# Patient Record
Sex: Female | Born: 1970
Health system: Southern US, Community
[De-identification: ages and names within clinical notes are randomized; demographics above are authoritative.]

## PROBLEM LIST (undated history)

## (undated) DIAGNOSIS — R32 Unspecified urinary incontinence: Secondary | ICD-10-CM

## (undated) DIAGNOSIS — F32A Depression, unspecified: Secondary | ICD-10-CM

## (undated) DIAGNOSIS — D071 Carcinoma in situ of vulva: Secondary | ICD-10-CM

## (undated) DIAGNOSIS — Z8544 Personal history of malignant neoplasm of other female genital organs: Secondary | ICD-10-CM

## (undated) DIAGNOSIS — R87629 Unspecified abnormal cytological findings in specimens from vagina: Secondary | ICD-10-CM

## (undated) DIAGNOSIS — Z923 Personal history of irradiation: Secondary | ICD-10-CM

## (undated) DIAGNOSIS — F200 Paranoid schizophrenia: Secondary | ICD-10-CM

## (undated) DIAGNOSIS — F329 Major depressive disorder, single episode, unspecified: Secondary | ICD-10-CM

## (undated) DIAGNOSIS — Z972 Presence of dental prosthetic device (complete) (partial): Secondary | ICD-10-CM

## (undated) DIAGNOSIS — Z8709 Personal history of other diseases of the respiratory system: Secondary | ICD-10-CM

## (undated) HISTORY — DX: Unspecified abnormal cytological findings in specimens from vagina: R87.629

## (undated) HISTORY — DX: Depression, unspecified: F32.A

## (undated) HISTORY — DX: Major depressive disorder, single episode, unspecified: F32.9

## (undated) HISTORY — PX: TUBAL LIGATION: SHX77

---

## 2001-03-13 ENCOUNTER — Other Ambulatory Visit: Admission: RE | Admit: 2001-03-13 | Discharge: 2001-03-13 | Payer: Self-pay | Admitting: Obstetrics and Gynecology

## 2001-05-14 ENCOUNTER — Observation Stay (HOSPITAL_COMMUNITY): Admission: RE | Admit: 2001-05-14 | Discharge: 2001-05-15 | Payer: Self-pay | Admitting: Obstetrics and Gynecology

## 2001-05-14 HISTORY — PX: OTHER SURGICAL HISTORY: SHX169

## 2002-07-15 ENCOUNTER — Inpatient Hospital Stay (HOSPITAL_COMMUNITY): Admission: RE | Admit: 2002-07-15 | Discharge: 2002-07-16 | Payer: Self-pay | Admitting: Obstetrics and Gynecology

## 2002-07-15 HISTORY — PX: VULVECTOMY PARTIAL: SHX6187

## 2003-06-20 ENCOUNTER — Other Ambulatory Visit: Admission: RE | Admit: 2003-06-20 | Discharge: 2003-06-20 | Payer: Self-pay | Admitting: Obstetrics and Gynecology

## 2006-03-13 ENCOUNTER — Ambulatory Visit (HOSPITAL_COMMUNITY): Admission: RE | Admit: 2006-03-13 | Discharge: 2006-03-13 | Payer: Self-pay | Admitting: Obstetrics and Gynecology

## 2007-11-15 ENCOUNTER — Other Ambulatory Visit: Admission: RE | Admit: 2007-11-15 | Discharge: 2007-11-15 | Payer: Self-pay | Admitting: Obstetrics & Gynecology

## 2008-02-06 ENCOUNTER — Ambulatory Visit (HOSPITAL_COMMUNITY): Admission: RE | Admit: 2008-02-06 | Discharge: 2008-02-06 | Payer: Self-pay | Admitting: Obstetrics & Gynecology

## 2008-02-06 ENCOUNTER — Encounter: Payer: Self-pay | Admitting: Obstetrics & Gynecology

## 2008-02-06 HISTORY — PX: OTHER SURGICAL HISTORY: SHX169

## 2009-02-27 ENCOUNTER — Other Ambulatory Visit: Admission: RE | Admit: 2009-02-27 | Discharge: 2009-02-27 | Payer: Self-pay | Admitting: Obstetrics & Gynecology

## 2010-04-08 ENCOUNTER — Emergency Department (HOSPITAL_COMMUNITY)
Admission: EM | Admit: 2010-04-08 | Discharge: 2010-04-08 | Payer: Self-pay | Source: Home / Self Care | Admitting: Emergency Medicine

## 2010-06-04 ENCOUNTER — Other Ambulatory Visit (HOSPITAL_BASED_OUTPATIENT_CLINIC_OR_DEPARTMENT_OTHER): Payer: Self-pay | Admitting: *Deleted

## 2010-06-04 DIAGNOSIS — Z139 Encounter for screening, unspecified: Secondary | ICD-10-CM

## 2010-06-10 ENCOUNTER — Ambulatory Visit (HOSPITAL_COMMUNITY): Payer: Medicaid Other

## 2010-06-11 ENCOUNTER — Other Ambulatory Visit (HOSPITAL_COMMUNITY): Payer: Self-pay | Admitting: "Endocrinology

## 2010-06-11 ENCOUNTER — Other Ambulatory Visit (HOSPITAL_COMMUNITY): Payer: Self-pay | Admitting: Internal Medicine

## 2010-06-11 DIAGNOSIS — Z139 Encounter for screening, unspecified: Secondary | ICD-10-CM

## 2010-06-17 ENCOUNTER — Ambulatory Visit (HOSPITAL_COMMUNITY)
Admission: RE | Admit: 2010-06-17 | Discharge: 2010-06-17 | Disposition: A | Discharge status: Home / Self Care | Payer: Medicaid Other | Source: Ambulatory Visit | Attending: Obstetrics & Gynecology | Admitting: Obstetrics & Gynecology

## 2010-06-17 DIAGNOSIS — Z139 Encounter for screening, unspecified: Secondary | ICD-10-CM

## 2010-06-17 DIAGNOSIS — Z1231 Encounter for screening mammogram for malignant neoplasm of breast: Secondary | ICD-10-CM | POA: Insufficient documentation

## 2010-09-07 NOTE — Op Note (Signed)
NAME:  Briana Wiley, Briana Wiley             ACCOUNT NO.:  [id number]   MEDICAL RECORD NO.:  [id number]          PATIENT TYPE:  AMB   LOCATION:  DAY                           FACILITY:  [facility]   PHYSICIAN:  Lazaro Arms, M.D.   DATE OF BIRTH:  [birth year]   DATE OF PROCEDURE:  02/06/2008  DATE OF DISCHARGE:                               OPERATIVE REPORT   PREOPERATIVE DIAGNOSES:  1. Menometrorrhagia.  2. Dysmenorrhea.  3. Unresponsive to Megace therapy.   POSTOPERATIVE DIAGNOSES:  1. Menometrorrhagia.  2. Dysmenorrhea.  3. Unresponsive to Megace therapy.   PROCEDURES:  1. Hysteroscopy.  2. Dilation and curettage.  3. Endometrial ablation.   SURGEON:  Lazaro Arms, MD   ANESTHESIA:  General endotracheal.   FINDINGS:  The patient had normal endometrium.  No polyps.  No fibroids.  No abnormalities.   DESCRIPTION OF OPERATION:  The patient was taken to the operating room  and placed in the supine position where she underwent general  endotracheal anesthesia, placed in the lithotomy position, and prepped  and draped in the usual sterile fashion.  The cervix was dilated  serially to allow passage of the hysteroscope.  Hysteroscopy was  performed and found to be normal.  A vigorous uterine curettage was then  performed and good uterine cryo was obtained in all areas.  ThermaChoice  3 endometrial ablation balloon was used.  13 mL of D5W was required to  maintain a pressure between 190 and 200 mmHg throughout the procedure.  It was heated to 87 degrees Celsius.  Total therapy time was 8 minutes and 44 seconds.  All the fluid was  returned. In the procedure, the equipment worked well.  The patient  received Ancef and Toradol prophylactically.  It was a clean  uncontaminated case.  All counts were correct.      Lazaro Arms, M.D.  Electronically Signed     LHE/MEDQ  D:  02/06/2008  T:  02/06/2008  Job:  [id number]

## 2010-09-10 NOTE — Op Note (Signed)
[facility]  Patient:    Briana Wiley, Briana Wiley Visit Number: [id number] MRN: [id number]          Service Type: OBV Location: 3A A321 01 Attending Physician:  Tilda Burrow Dictated by:   Christin Bach, M.D. Proc. Date: 05/14/01 Admit Date:  05/14/2001 Discharge Date: 05/15/2001                             Operative Report  PREOPERATIVE DIAGNOSIS:   Cervical dysplasia, pigmented vulvar lesions, rule out vulvar neoplasia.  POSTOPERATIVE DIAGNOSIS:  Cervical dysplasia, pigmented vulvar lesions, rule out neoplasia.  PROCEDURE:  Cold knife conization, multiple vulvar biopsies.  INDICATIONS:  A 40 year old, nulliparous female with borderline mental function, residing at [facility], admitted for [facility] for evaluation of abnormal Pap smear which showed high-grade abnormalities with endocervical involvement.  Additionally the patient had some warty lesions on the left side of vaginal entrance and dark, pigmented changes involving numerous portions of the labia majora.  Past medical history was notable for schizophrenia.  DESCRIPTION OF PROCEDURE:  The patient was taken to the operating room, prepped and draped for a vaginal procedure with general anesthesia in place. The weighted speculum was inserted into the vagina and lateral retractor was used and the cervix visualized.  Lugols solution was applied to identify nonstaining areas of the cervix and then a circumferential elliptical incision performed to remove the entire exocervix and a cone-shaped specimen removed, going approximately 2 cm up the endocervical canal.  Specimen was technically challenging to remove and had to be opened as a part of the removal.  Hemostasis was adequate with Monsels solution completing the procedure.  Vulvar biopsies:  The patient then had approximately one-third of the dark, pigmented lesions on the vulva removed by elliptical incision around their edges.  We were  careful to leave a 2-3 mm area of visually normal appearing tissue as a part of an elliptical incision.  On the patients right labia minora area, there was such extensive involvement that only representative biopsy samples could be obtained.  In the areas where the subcutaneous tissue was completely removed, we placed subcutaneous 4-0 Dexon sutures to pull tissue edges into reapproximation.  The patient tolerated the procedure well and will be kept overnight for observation and pain control. Dictated by:   Christin Bach, M.D. Attending Physician:  Tilda Burrow DD:  05/28/01 TD:  05/28/01 Job: [id number]

## 2010-09-10 NOTE — H&P (Signed)
   NAME:  Briana Wiley, Briana Wiley NO.:  [id number]   MEDICAL RECORD NO.:  [id number]                  PATIENT TYPE:   LOCATION:                                       FACILITY:   PHYSICIAN:  Tilda Burrow, M.D.              DATE OF BIRTH:   DATE OF ADMISSION:  DATE OF DISCHARGE:                                HISTORY & PHYSICAL   ADMISSION DIAGNOSES:  1. Vulvar entrance epithelial neoplasia III.  2. Paranoid schizophrenia, stable.   HISTORY OF PRESENT ILLNESS:  This 40 year old female gravida 3, para 3,  status post bilateral tubal ligation and prior wide local excision vulvar  biopsy confirming VIN III of the vulvar is admitted at this time for partial  bilateral vulvectomy with plans to remove the majority of the labia minora  on both sides due to extensive involvement in the dysplastic process felt to  be due to the same VIN III noted before.  Plans are for local excision and  reapproximation of skin edges at the time.  We have talked with the  patient's caregivers as well as with the patient and explained the need for  this procedure in great detail to her.  She seems accepting and  understanding of the procedure.  The family has arranged to get the patient  to the hospital for that date.   PAST MEDICAL HISTORY:  Paranoid schizophrenia, stable.  Disability.   PAST SURGICAL HISTORY:  Negative except for wide local excision of vulvar  lesions in 2003.   MEDICATIONS:  None.   PHYSICAL EXAMINATION:  VITAL SIGNS:  Height 5 feet, 5 inches, weight 145.  Blood pressure 120/75.  GENERAL:  Shows an alert, communicative Caucasian female.  HEENT: Pupils equal, round, reactive to light.  Extraocular muscles intact.  NECK:  Supple.  Trachea midline.  CHEST:  Clear to auscultation.  ABDOMEN:  Obese without masses.  EXTERNAL GENITALIA:  Thickened hyperkeratotic vulvar tissues bilaterally  which are consistent with VIN III biopsied earlier. Perianal tissue is  normal.  Vaginal examination is negative at this time.  Prior Pap smear  showed cervical dysplasia at the time of conization in 2003.   PLAN:  Bilateral partial vulvectomy on July 15, 2002.                                               Tilda Burrow, M.D.    JVF/MEDQ  D:  07/12/2002  T:  07/12/2002  Job:  [id number]

## 2010-09-10 NOTE — Op Note (Signed)
   NAME:  Briana Wiley, Briana Wiley                       ACCOUNT NO.:  [id number]   MEDICAL RECORD NO.:  [id number]                   PATIENT TYPE:  AMB   LOCATION:  DAY                                  FACILITY:  [facility]   PHYSICIAN:  Tilda Burrow, M.D.              DATE OF BIRTH:  1970-10-05   DATE OF PROCEDURE:  DATE OF DISCHARGE:                                 OPERATIVE REPORT   PREOPERATIVE DIAGNOSIS:  Vulvar intraepithelial neoplasia III (CIN 3).   POSTOPERATIVE DIAGNOSIS:  Vulvar intraepithelial neoplasia III (CIN 3).   PROCEDURE:  Bilateral partial vulvectomy.   SURGEON:  Tilda Burrow, M.D.   ASSISTANTAmie Critchley, C.S.T.   ANESTHESIA:  General.   COMPLICATIONS:  None.   FINDINGS:  Extensive dysplastic changes of the labia minora from 2 o'clock  to 10 o'clock involving the posterior fourchette and a couple of satellite  lesions requiring excision.   DESCRIPTION OF PROCEDURE:  The patient was taken to the operating room and  prepped and draped for a vaginal procedure with legs elevated and yellow  thin supports.  The patient had demarcation of the atypical vulvar tissues  with a skin marker.  We then proceeded to infiltrate underneath all of the  affected tissues with 0.5% Marcaine with epinephrine.   We then proceeded to use a #15 blade to trim the edges of the abnormal  tissue.  We were very cautious to try to cut just a millimeter or two  lateral to the edges of the affected tissue.  This was performed all the way  around the affected skin on the patient's right side first.  Two satellite  lesions were cored out as well.  We then undermined once these were trimmed  off, removing the labia minora entirely from 10 o'clock to 6 o'clock.  We  undermined the adjacent connective tissue internally and externally  sufficient that a series of four interrupted 4-0 Dexon subcutaneous sutures  were placed to better reapproximate the tissue edges then a continuous  running 4-0  Prolene used to close the skin edges with a loose continuous  closure.  The patient tolerated this part quite well.   We then turned to the other side where a similar technique was used.  The  result was smooth, well-reapproximated skin edges bilaterally.  There was  slight oozing.  Topical Neosporin was applied and the patient allowed to go  to the recovery room in good condition.                                              Tilda Burrow, M.D.   JVF/MEDQ  D:  07/15/2002  T:  07/15/2002  Job:  [id number]

## 2010-09-10 NOTE — Discharge Summary (Signed)
   NAME:  Briana Wiley, BITTINGER                       ACCOUNT NO.:  [id number]   MEDICAL RECORD NO.:  [id number]                   PATIENT TYPE:  INP   LOCATION:  A304                                 FACILITY:  [facility]   PHYSICIAN:  Tilda Burrow, M.D.              DATE OF BIRTH:  [birth year]   DATE OF ADMISSION:  07/15/2002  DATE OF DISCHARGE:  07/16/2002                                 DISCHARGE SUMMARY   ADMISSION DIAGNOSES:  1. Vulvar intraepithelial neoplasia III (VIN III).  2. Paranoid schizophrenia, stable.   DISCHARGE DIAGNOSES:  1. Vulvar intraepithelial neoplasia III (VIN III).  2. Paranoid schizophrenia, stable.   PROCEDURE:  July 14, 2001:  Bilateral partial vulvectomy.   DISCHARGE MEDICATIONS:  1. Neosporin topical to vulva daily x2 weeks.  2. Motrin 400 mg q.4h. p.r.n. pain.  3. Haldol intramuscular injection every month through [facility].   FOLLOW-UP:  Two weeks in our office for suture removal.   HISTORY OF PRESENT ILLNESS:  This 40 year old female gravida 3 para 2 status  post tubal ligation with wide excision vulvar biopsies in the past  confirming VIN III is admitted for bilateral partial vulvectomy due to  extensive involvement of the labia minora in the dysplastic process.   PAST MEDICAL HISTORY:  Paranoid schizophrenia, stable.   SURGICAL HISTORY:  Negative except for a tubal ligation, and wide excision  of vulvar lesions in 2003.   PHYSICAL EXAMINATION:  VITAL SIGNS:  Height 5 feet 5 inches, weight 145.  Blood pressure 120/75.  GYNECOLOGICAL:  Notable for thickened hyperkeratotic vulvar tissues  displacing the labia minora from 2 o'clock to 10 o'clock including the  posterior fourchet.  A couple of isolated satellite lesions exist.   HOSPITAL COURSE:  The patient was taken to the operating room and the VIN  III involved areas excised as described in the operative note with local  anesthetic primarily used, with general anesthesia  to help the patient  remain calm.  The patient had excellent tissue edge reapproximation and had  surprisingly little pain postoperatively.  She was stable for discharge home  on postoperative day #1 for follow-up in one week, then two weeks in our  office.                                              Tilda Burrow, M.D.   JVF/MEDQ  D:  07/16/2002  T:  07/16/2002  Job:  [id number]

## 2010-09-10 NOTE — H&P (Signed)
[facility]  Patient:    Briana Wiley, Briana Wiley Visit Number: [id number] MRN: [id number]          Service Type: Attending:  Christin Bach, M.D. Dictated by:   Christin Bach, M.D. Adm. Date:  05/11/01                           History and Physical  ADMITTING DIAGNOSES: 1. Cervical dysplasia with endocervical involvement. 2. Pigmented vulvar lesions, rule out vulvar intraepithelial neoplasia.  HISTORY OF PRESENT ILLNESS:  This 40 year old female, G0, P0, referred from [facility] who is admitted at this time for cold-knife conization and vulvar biopsies.  Kitti has been seen in our office for evaluation of abnormal Pap smear.  She had a Pap smear by Dr. Felecia Shelling on February 16, 2001, suggesting high-grade squamous epithelial abnormalities with both moderate dysplasia and HPV effect noted.  She was seen in our office where upon colposcopy was performed March 13, 2001.  This showed an area of abnormal tissue at the posterior lip of the cervix at 6 oclock to 9 oclock.  This was biopsied and returned showing mild dysplasia.  Unfortunately, there was endocervical involvement on the endocervical curettage despite efforts to avoid contamination.  The endocervical assessment was performed to rule out hidden abnormalities.  The procedure has been explained to the patient with visual guides as her understanding is somewhat limited.  Additionally, the patient has some warty lesion on the left side of the vaginal entrance which may represent condyloma or may represent vulvar intraepithelial neoplasia.  Biopsies are planned to rule out premalignant changes.  PAST MEDICAL HISTORY:  Schizophrenia.  PAST SURGICAL HISTORY:  Negative.  ALLERGIES:  No known drug allergies.  MEDICATIONS:  None.  PHYSICAL EXAMINATION:  VITAL SIGNS:  Height 5 feet 5 inches, weight 161.  Blood pressure 110/60.  GENERAL:  This is a somber, cheerful female who appears to have  appropriate, but limited comprehension of issues involved.  The procedure has been explained to her satisfaction and with visual guides to make it easier for her.  CHEST:  Clear to auscultation.  ABDOMEN:  Nontender.  GENITALIA:  External genitalia with multiple vulvar lesions around the introitus as well as three hyperplastic lesions in the left gluteal crease.  IMPRESSION: 1. Cervical intraepithelial neoplasia-1 with endocervical involvement, rule    out high-grade dysplasia or endocervical involvement. 2. Pigmented vulvar lesions, rule out vulvar premalignant changes.  PLAN:  Cold-knife conization and vulvar biopsies on May 14, 2001. Dictated by:   Christin Bach, M.D. Attending:  Christin Bach, M.D. DD:  05/11/01 TD:  05/11/01 Job: [id number]

## 2011-01-24 LAB — COMPREHENSIVE METABOLIC PANEL
AST: 15
Albumin: 4.3
BUN: 7
Calcium: 9.7
Creatinine, Ser: 0.88
GFR calc Af Amer: >60
Total Protein: 6.5

## 2011-01-24 LAB — URINE MICROSCOPIC-ADD ON

## 2011-01-24 LAB — CBC
HCT: 39.5
Hemoglobin: 13.9
MCHC: 35.1
MCV: 90.1
Platelets: 176
RBC: 4.39
RDW: 14.4
WBC: 10.2

## 2011-01-24 LAB — URINALYSIS, ROUTINE W REFLEX MICROSCOPIC
Glucose, UA: NEGATIVE
Leukocytes, UA: NEGATIVE
Specific Gravity, Urine: 1.02
pH: 6

## 2011-05-09 ENCOUNTER — Other Ambulatory Visit (HOSPITAL_COMMUNITY): Payer: Self-pay | Admitting: Internal Medicine

## 2011-05-09 DIAGNOSIS — Z139 Encounter for screening, unspecified: Secondary | ICD-10-CM

## 2011-06-08 DIAGNOSIS — F200 Paranoid schizophrenia: Secondary | ICD-10-CM | POA: Diagnosis not present

## 2011-06-20 ENCOUNTER — Ambulatory Visit (HOSPITAL_COMMUNITY)
Admission: RE | Admit: 2011-06-20 | Discharge: 2011-06-20 | Disposition: A | Discharge status: Home / Self Care | Payer: Medicare Other | Source: Ambulatory Visit | Attending: Internal Medicine | Admitting: Internal Medicine

## 2011-06-20 DIAGNOSIS — Z1231 Encounter for screening mammogram for malignant neoplasm of breast: Secondary | ICD-10-CM | POA: Diagnosis not present

## 2011-06-20 DIAGNOSIS — Z139 Encounter for screening, unspecified: Secondary | ICD-10-CM

## 2011-06-21 DIAGNOSIS — L089 Local infection of the skin and subcutaneous tissue, unspecified: Secondary | ICD-10-CM | POA: Diagnosis not present

## 2011-08-22 DIAGNOSIS — G608 Other hereditary and idiopathic neuropathies: Secondary | ICD-10-CM | POA: Diagnosis not present

## 2011-08-22 DIAGNOSIS — R7309 Other abnormal glucose: Secondary | ICD-10-CM | POA: Diagnosis not present

## 2011-08-22 DIAGNOSIS — E039 Hypothyroidism, unspecified: Secondary | ICD-10-CM | POA: Diagnosis not present

## 2011-08-22 DIAGNOSIS — R799 Abnormal finding of blood chemistry, unspecified: Secondary | ICD-10-CM | POA: Diagnosis not present

## 2011-10-13 DIAGNOSIS — F200 Paranoid schizophrenia: Secondary | ICD-10-CM | POA: Diagnosis not present

## 2011-11-21 DIAGNOSIS — F17200 Nicotine dependence, unspecified, uncomplicated: Secondary | ICD-10-CM | POA: Diagnosis not present

## 2011-11-21 DIAGNOSIS — R51 Headache: Secondary | ICD-10-CM | POA: Diagnosis not present

## 2011-12-30 DIAGNOSIS — N63 Unspecified lump in unspecified breast: Secondary | ICD-10-CM | POA: Diagnosis not present

## 2012-01-03 ENCOUNTER — Other Ambulatory Visit (HOSPITAL_COMMUNITY): Payer: Self-pay | Admitting: Internal Medicine

## 2012-01-03 DIAGNOSIS — N63 Unspecified lump in unspecified breast: Secondary | ICD-10-CM

## 2012-01-05 DIAGNOSIS — Z23 Encounter for immunization: Secondary | ICD-10-CM | POA: Diagnosis not present

## 2012-01-05 DIAGNOSIS — F200 Paranoid schizophrenia: Secondary | ICD-10-CM | POA: Diagnosis not present

## 2012-01-05 DIAGNOSIS — L259 Unspecified contact dermatitis, unspecified cause: Secondary | ICD-10-CM | POA: Diagnosis not present

## 2012-01-18 ENCOUNTER — Ambulatory Visit (HOSPITAL_COMMUNITY)
Admission: RE | Admit: 2012-01-18 | Discharge: 2012-01-18 | Disposition: A | Discharge status: Home / Self Care | Payer: Medicare Other | Source: Ambulatory Visit | Attending: Internal Medicine | Admitting: Internal Medicine

## 2012-01-18 ENCOUNTER — Other Ambulatory Visit (HOSPITAL_COMMUNITY): Payer: Self-pay | Admitting: Internal Medicine

## 2012-01-18 DIAGNOSIS — N63 Unspecified lump in unspecified breast: Secondary | ICD-10-CM | POA: Insufficient documentation

## 2012-03-01 DIAGNOSIS — F17200 Nicotine dependence, unspecified, uncomplicated: Secondary | ICD-10-CM | POA: Diagnosis not present

## 2012-03-01 DIAGNOSIS — J309 Allergic rhinitis, unspecified: Secondary | ICD-10-CM | POA: Diagnosis not present

## 2012-04-19 DIAGNOSIS — F200 Paranoid schizophrenia: Secondary | ICD-10-CM | POA: Diagnosis not present

## 2012-05-31 DIAGNOSIS — J410 Simple chronic bronchitis: Secondary | ICD-10-CM | POA: Diagnosis not present

## 2012-05-31 DIAGNOSIS — J309 Allergic rhinitis, unspecified: Secondary | ICD-10-CM | POA: Diagnosis not present

## 2012-06-06 ENCOUNTER — Other Ambulatory Visit (HOSPITAL_COMMUNITY): Payer: Self-pay | Admitting: Internal Medicine

## 2012-06-06 DIAGNOSIS — Z139 Encounter for screening, unspecified: Secondary | ICD-10-CM

## 2012-06-25 ENCOUNTER — Ambulatory Visit (HOSPITAL_COMMUNITY)
Admission: RE | Admit: 2012-06-25 | Discharge: 2012-06-25 | Disposition: A | Discharge status: Home / Self Care | Payer: Medicare Other | Source: Ambulatory Visit | Attending: Internal Medicine | Admitting: Internal Medicine

## 2012-06-25 DIAGNOSIS — Z1231 Encounter for screening mammogram for malignant neoplasm of breast: Secondary | ICD-10-CM | POA: Insufficient documentation

## 2012-06-25 DIAGNOSIS — Z139 Encounter for screening, unspecified: Secondary | ICD-10-CM

## 2012-08-23 DIAGNOSIS — F200 Paranoid schizophrenia: Secondary | ICD-10-CM | POA: Diagnosis not present

## 2012-08-30 DIAGNOSIS — J309 Allergic rhinitis, unspecified: Secondary | ICD-10-CM | POA: Diagnosis not present

## 2012-11-01 DIAGNOSIS — F200 Paranoid schizophrenia: Secondary | ICD-10-CM | POA: Diagnosis not present

## 2012-11-30 DIAGNOSIS — J309 Allergic rhinitis, unspecified: Secondary | ICD-10-CM | POA: Diagnosis not present

## 2012-11-30 DIAGNOSIS — J410 Simple chronic bronchitis: Secondary | ICD-10-CM | POA: Diagnosis not present

## 2013-02-07 DIAGNOSIS — F209 Schizophrenia, unspecified: Secondary | ICD-10-CM | POA: Diagnosis not present

## 2013-03-03 IMAGING — US US BREAST*L*
1 series · 13 of 13 positions shown · non-contrast
Comparison: Bilateral screening mammogram 06/20/2011

CLINICAL DATA: Breast mass.  The patient has an area of concern on
the skin of the left breast in the lower outer quadrant.  She
states that she has not had any known trauma in this region.  She
denies any drainage from the skin lesion.

DIGITAL DIAGNOSTIC LEFT MAMMOGRAM WITH CAD AND LEFT BREAST
ULTRASOUND:

[Series 1: us breast*left* · 0.05mm/px · 13 of 13 slices shown]
[im 1/13]
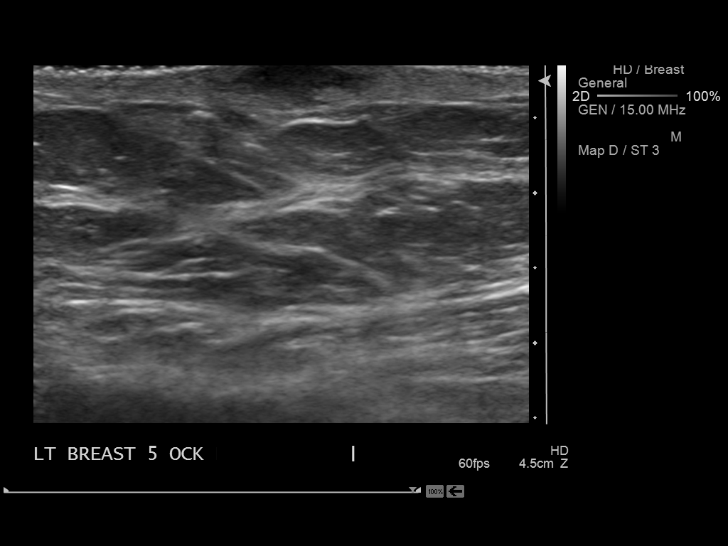
[im 2/13]
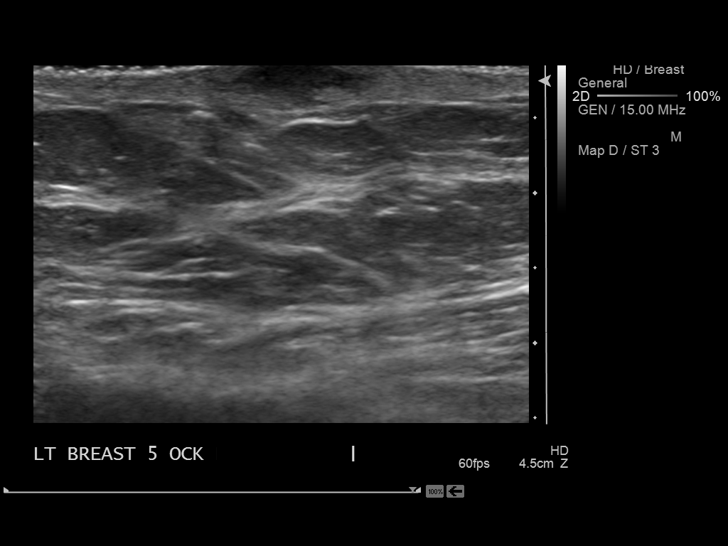
[im 3/13]
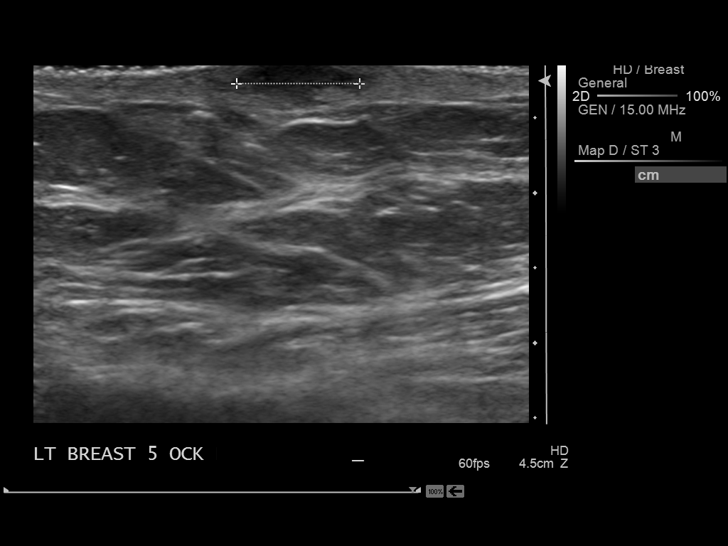
[im 4/13]
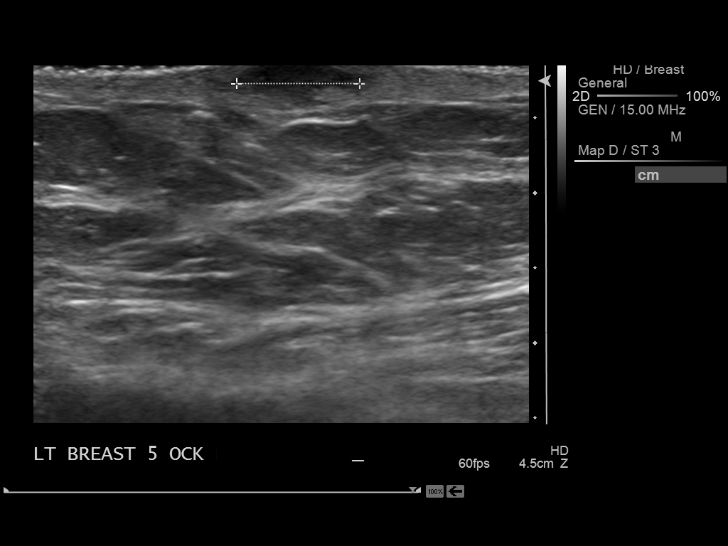
[im 5/13]
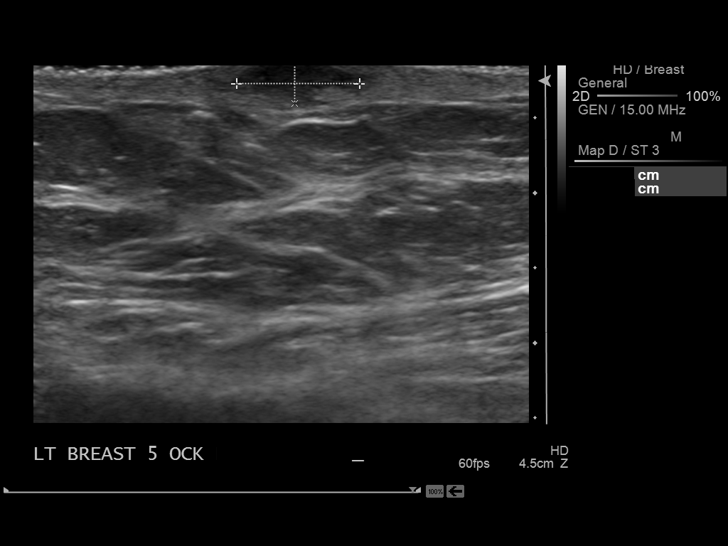
[im 6/13]
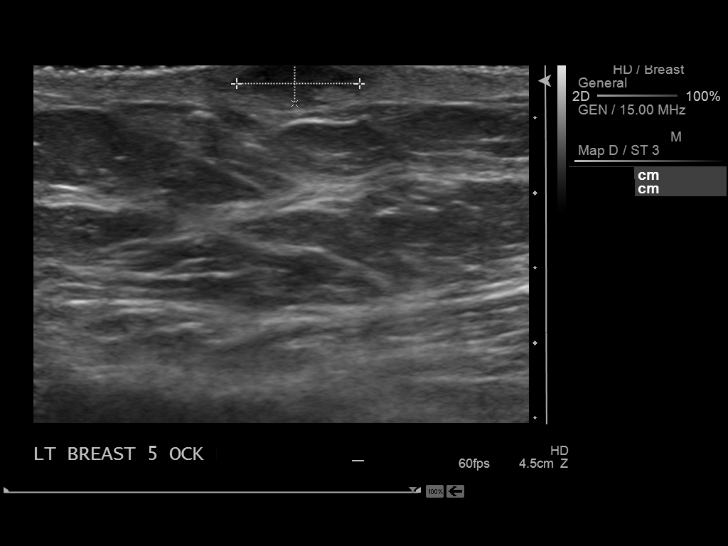
[im 7/13]
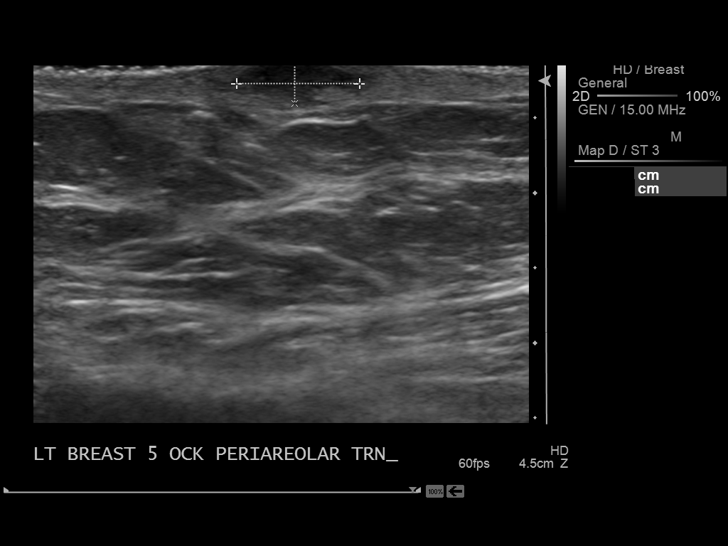
[im 8/13]
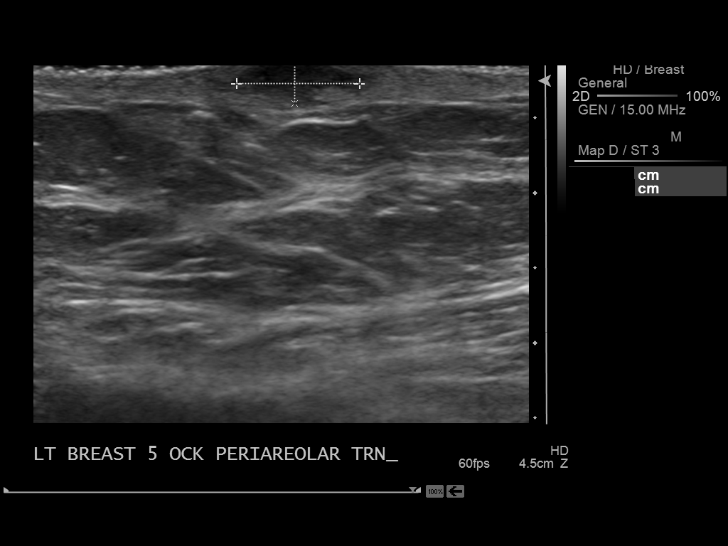
[im 9/13]
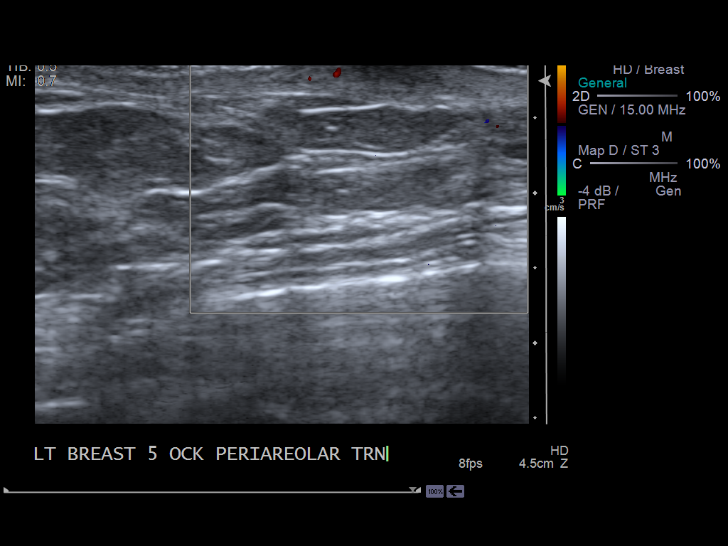
[im 10/13]
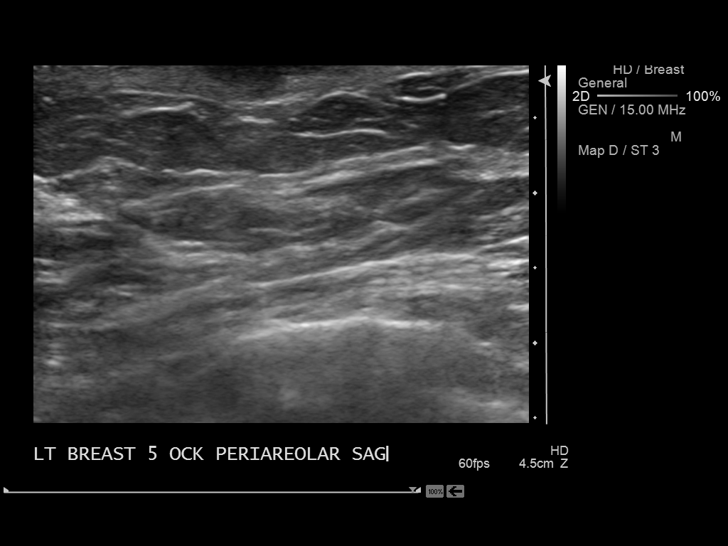
[im 11/13]
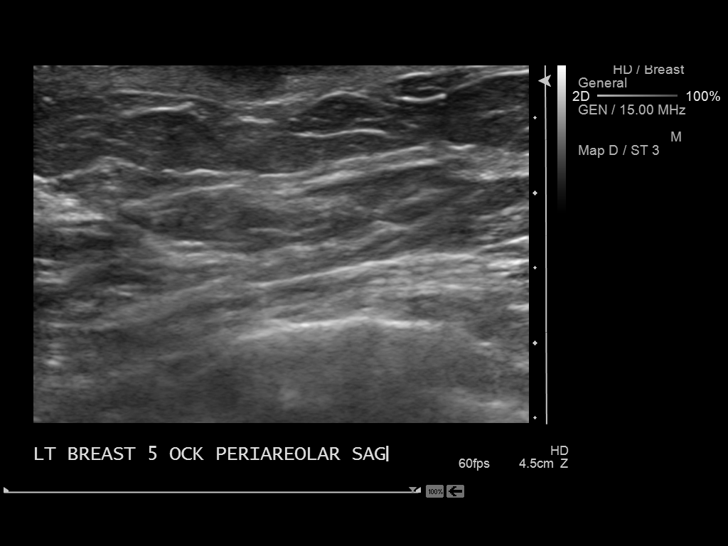
[im 12/13]
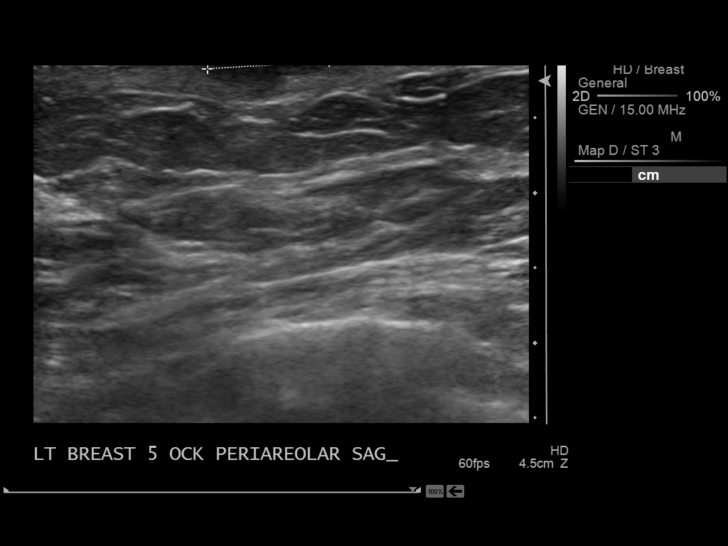
[im 13/13]
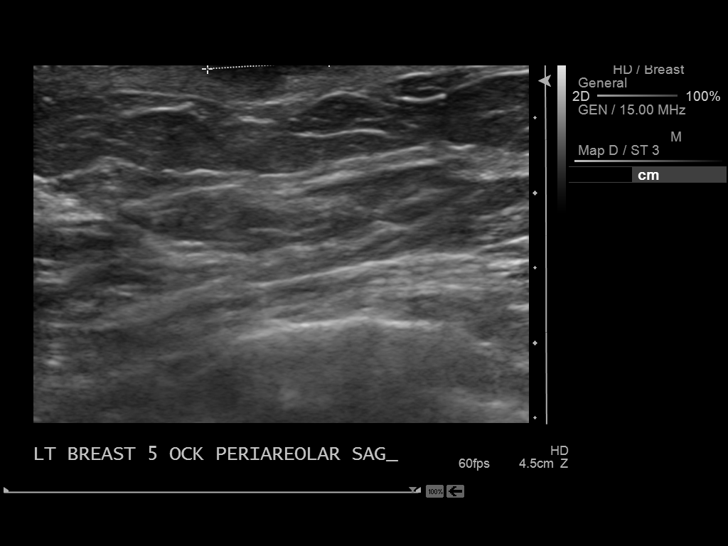

[13 of 13 positions shown; findings below may reference images not displayed]

FINDINGS: There are scattered fibroglandular densities in the left
breast.  The region of patient concern is demarcated with a skin
BB.  A focal  tangential view of the area of concern demonstrates a
focal skin thickening.  No breast parenchymal mass, suspicious
microcalcification, or distortion is identified to suggest
malignancy.

Mammographic images were processed with CAD.

On physical exam, there is approximately 1 cm slightly erythematous
and scabbed area in the 5 o'clock periareolar left breast.

Ultrasound is performed, showing a small mildly complex fluid
collection within the skin that measures 8 x 3 x 8 mm.  There is no
internal color Doppler flow.  The breast parenchyma in this region
is negative.
IMPRESSION: 1.  No evidence of malignancy in the left breast.
2.  The area of patient concern corresponds to a small intradermal
lesion that most likely reflects a small sebaceous cyst.

RECOMMENDATION:
Bilateral screening mammogram May 2012.

BI-RADS CATEGORY 2:  Benign finding(s).

## 2013-03-12 DIAGNOSIS — Z23 Encounter for immunization: Secondary | ICD-10-CM | POA: Diagnosis not present

## 2013-03-12 DIAGNOSIS — J309 Allergic rhinitis, unspecified: Secondary | ICD-10-CM | POA: Diagnosis not present

## 2013-05-02 DIAGNOSIS — F200 Paranoid schizophrenia: Secondary | ICD-10-CM | POA: Diagnosis not present

## 2013-06-20 ENCOUNTER — Other Ambulatory Visit (HOSPITAL_COMMUNITY): Payer: Self-pay | Admitting: Internal Medicine

## 2013-06-20 DIAGNOSIS — Z1231 Encounter for screening mammogram for malignant neoplasm of breast: Secondary | ICD-10-CM

## 2013-06-20 DIAGNOSIS — Z139 Encounter for screening, unspecified: Secondary | ICD-10-CM

## 2013-06-25 DIAGNOSIS — F2089 Other schizophrenia: Secondary | ICD-10-CM | POA: Diagnosis not present

## 2013-06-25 DIAGNOSIS — F17200 Nicotine dependence, unspecified, uncomplicated: Secondary | ICD-10-CM | POA: Diagnosis not present

## 2013-06-27 ENCOUNTER — Ambulatory Visit (HOSPITAL_COMMUNITY)
Admission: RE | Admit: 2013-06-27 | Discharge: 2013-06-27 | Disposition: A | Discharge status: Home / Self Care | Payer: Medicare Other | Source: Ambulatory Visit | Attending: Internal Medicine | Admitting: Internal Medicine

## 2013-06-27 DIAGNOSIS — Z1231 Encounter for screening mammogram for malignant neoplasm of breast: Secondary | ICD-10-CM

## 2013-08-08 DIAGNOSIS — F209 Schizophrenia, unspecified: Secondary | ICD-10-CM | POA: Diagnosis not present

## 2013-10-01 DIAGNOSIS — Z0000 Encounter for general adult medical examination without abnormal findings: Secondary | ICD-10-CM | POA: Diagnosis not present

## 2013-10-17 ENCOUNTER — Other Ambulatory Visit: Payer: Self-pay | Admitting: Adult Health

## 2013-10-31 ENCOUNTER — Other Ambulatory Visit: Payer: Self-pay | Admitting: Adult Health

## 2013-11-07 ENCOUNTER — Encounter (INDEPENDENT_AMBULATORY_CARE_PROVIDER_SITE_OTHER): Payer: Medicare Other | Admitting: Obstetrics & Gynecology

## 2013-11-07 ENCOUNTER — Ambulatory Visit (INDEPENDENT_AMBULATORY_CARE_PROVIDER_SITE_OTHER): Payer: Medicare Other | Admitting: Adult Health

## 2013-11-07 ENCOUNTER — Encounter: Payer: Self-pay | Admitting: Adult Health

## 2013-11-07 ENCOUNTER — Other Ambulatory Visit: Payer: Self-pay | Admitting: Adult Health

## 2013-11-07 VITALS — BP 110/58 | HR 76 | Ht 60.5 in | Wt 121.0 lb

## 2013-11-07 DIAGNOSIS — D071 Carcinoma in situ of vulva: Secondary | ICD-10-CM

## 2013-11-07 DIAGNOSIS — Z0000 Encounter for general adult medical examination without abnormal findings: Secondary | ICD-10-CM

## 2013-11-07 DIAGNOSIS — N901 Moderate vulvar dysplasia: Secondary | ICD-10-CM

## 2013-11-07 DIAGNOSIS — C519 Malignant neoplasm of vulva, unspecified: Secondary | ICD-10-CM

## 2013-11-07 DIAGNOSIS — N9089 Other specified noninflammatory disorders of vulva and perineum: Secondary | ICD-10-CM | POA: Insufficient documentation

## 2013-11-07 NOTE — Progress Notes (Addendum)
Patient ID: BARI LEIB, female   DOB: July 24, 1970, 43 y.o.   MRN: [id number] History of Present Illness: Saryna is a 43 year old white female, widowed, who resides at [facility], in for a pap and physical.She is complaining of pain and itching in vagina area.   Current Medications, Allergies, Past Medical History, Past Surgical History, Family History and Social History were reviewed in [employer] record.     Review of Systems: Patient denies any headaches, blurred vision, shortness of breath, chest pain, abdominal pain, problems with bowel movements,or  Urination.No joint pain, has some mood swings, on meds.Husband died recently.    Physical Exam:BP 110/58  Pulse 76  Ht 5' 0.5" (1.537 m)  Wt 121 lb (54.885 kg)  BMI 23.23 kg/m2  LMP 10/31/2013 General:  Well developed, well nourished, no acute distress Skin:  Warm and dry Neck:  Midline trachea, normal thyroid Lungs; Clear to auscultation bilaterally Breast:  No dominant palpable mass, retraction, or nipple discharge Cardiovascular: Regular rate and rhythm Abdomen:  Soft, non tender, no hepatosplenomegaly Pelvic:  External genitalia has cauliflower irregular like mass right labia, Dr Elonda Husky in to biopsy, see his procedure note,could not do pelvic or pap due to fragility of mass Rectal: deferred Extremities:  No swelling or varicosities noted Psych:  No mood changes,alert and coopertive   Impression: Physical exam Vulva mass, probably cancer    Plan: Return in 1 week to see Dr Elonda Husky and get pathology results.

## 2013-11-07 NOTE — Patient Instructions (Signed)
Return in 1 week to see dr Elonda Husky

## 2013-11-07 NOTE — Progress Notes (Signed)
Pt presented today to Derrek Monaco, NP for evaluation of vaginal itching and soreness for a couple of weeks. Anderson Malta noted a right vulvar lesion which was enlarged and consistent with possible vulvar canrcinoma i concurred and talked with the pt and recommended to do a vulvar biopsy  Area was prepped Local anesthetic was injected 4 mm punch biopsy used and tissue returned and sent to pathology for evaluation  My concern is that this represents an invasive  vulvar cancer Will see next week to go over biopsy report

## 2013-11-14 ENCOUNTER — Ambulatory Visit: Payer: Medicare Other | Admitting: Obstetrics & Gynecology

## 2013-11-20 ENCOUNTER — Encounter: Payer: Self-pay | Admitting: Obstetrics & Gynecology

## 2013-11-20 ENCOUNTER — Ambulatory Visit (INDEPENDENT_AMBULATORY_CARE_PROVIDER_SITE_OTHER): Payer: Medicare Other | Admitting: Obstetrics & Gynecology

## 2013-11-20 VITALS — BP 100/70 | Wt 122.0 lb

## 2013-11-20 DIAGNOSIS — C519 Malignant neoplasm of vulva, unspecified: Secondary | ICD-10-CM

## 2013-11-20 NOTE — Progress Notes (Signed)
Patient ID: Briana Wiley, female   DOB: [birth year], 43 y.o.   MRN: [id number] Pt seen for follow up from her vulvar biopsy  Report states High grade dysplasia suspicious for [facility] but visually this is undoubtedly vulvar cancer It is diffiuclt to find an area that does not have necrotic changes  As a result I am referring her to my [place] oncology colleagues in [place] Her appointment is with Dr Denman George, August 10 @0945   Letter to Dr Denman George

## 2013-11-21 DIAGNOSIS — F209 Schizophrenia, unspecified: Secondary | ICD-10-CM | POA: Diagnosis not present

## 2013-12-02 ENCOUNTER — Encounter: Payer: Self-pay | Admitting: Gynecologic Oncology

## 2013-12-02 ENCOUNTER — Ambulatory Visit: Payer: Medicare Other | Attending: Gynecologic Oncology | Admitting: Gynecologic Oncology

## 2013-12-02 VITALS — BP 104/78 | HR 72 | Temp 97.9°F | Resp 18 | Ht 63.0 in | Wt 119.4 lb

## 2013-12-02 DIAGNOSIS — F3289 Other specified depressive episodes: Secondary | ICD-10-CM | POA: Insufficient documentation

## 2013-12-02 DIAGNOSIS — F17200 Nicotine dependence, unspecified, uncomplicated: Secondary | ICD-10-CM | POA: Diagnosis not present

## 2013-12-02 DIAGNOSIS — D071 Carcinoma in situ of vulva: Secondary | ICD-10-CM | POA: Diagnosis not present

## 2013-12-02 DIAGNOSIS — Z79899 Other long term (current) drug therapy: Secondary | ICD-10-CM | POA: Insufficient documentation

## 2013-12-02 DIAGNOSIS — N9089 Other specified noninflammatory disorders of vulva and perineum: Secondary | ICD-10-CM

## 2013-12-02 DIAGNOSIS — N9489 Other specified conditions associated with female genital organs and menstrual cycle: Secondary | ICD-10-CM

## 2013-12-02 DIAGNOSIS — C519 Malignant neoplasm of vulva, unspecified: Secondary | ICD-10-CM | POA: Insufficient documentation

## 2013-12-02 DIAGNOSIS — F329 Major depressive disorder, single episode, unspecified: Secondary | ICD-10-CM | POA: Diagnosis not present

## 2013-12-02 DIAGNOSIS — F200 Paranoid schizophrenia: Secondary | ICD-10-CM | POA: Diagnosis not present

## 2013-12-02 DIAGNOSIS — F209 Schizophrenia, unspecified: Secondary | ICD-10-CM | POA: Insufficient documentation

## 2013-12-02 HISTORY — PX: VULVA / PERINEUM BIOPSY: SUR155

## 2013-12-02 NOTE — Patient Instructions (Addendum)
We will call you with your scan results. Follow up with Dr. Sondra Come for Radiation.  New patient appt with Dr. Sondra Come in Radiation Oncology at the [facility] on Aug 26 at 10:30am.  Arrive at 10:15 am to register.

## 2013-12-02 NOTE — Progress Notes (Signed)
Consult Note: Gyn-Onc  Consult was requested by Dr. Elonda Husky for the evaluation of ERMA RAICHE 43 y.o. female with squamous cell cancer of the vulva, clinical stage II  CC:  Chief Complaint  Patient presents with  . High grade dysplasia  Vulvar cancer  Assessment/Plan:  Ms. BEILA PURDIE  is a 43 y.o.  year old who has chronic paranoid schizophrenia, tobacco abuse and a clinical stage II vulvar SCC.  The lesion is very extensive. It is circumferential (until proven otherwise on today's biopsy) and encroaches upon the anal sphincter posteriorly and on the right. Involves the most distal third part of the vaginal introitus. I believe that in order to primarily excise this lesion a complete radical vulvectomy would be required. This would necessitate likely flaps or tissue mobilization to close the defect. It would also be unlikely as an adequate anal margin would be achieved without compromise of the anal sphincter. It would be almost certain that the patient would require adjuvant radiation postoperatively. Given her her poor social circumstances and tobacco abuse, I have serious concerns about her ability to heal from such a surgery (particularly if adjuvant radiation is required).  Instead I believe a primary course of radiation to the vulvar tissues with curative intent is likely to achieve adequate control of the disease. I would resample the vulva mass after radiation is complete to document pathologic response to radiation. If residual tumor is present, we would consider interval vulvectomy. We have ordered a PET to evaluate for distant metastatic disease or clinically suspicious lymph nodes in the groins.   HPI: Dalexa Gentz is a 43 year old woman who is seen in consultation at the request of Dr. Elonda Husky for clinical stage II vulvar squamous cell carcinoma. 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 She also has a history significant  for paranoid schizophrenia and resides in her care facility for this. She is an extremely heavy smoker and continues to smoke 1-2 packs per day. She began experiencing vulvar irritation in the past few months. She was seen by Dr. Elonda Husky for this. He recognized the posterior 5 cm raised erythematous vulva mass and performed a biopsy. Pathology revealed at least high-grade VIN associated with an area highly suspicious for squamous cell carcinoma.  Interval History: She continues to have vulvar pruritus, irritation, and intermittent bleeding from the area.  Current Meds:  Outpatient Encounter Prescriptions as of 12/02/2013  Medication Sig  . benztropine (COGENTIN) 1 MG tablet Take 1 mg by mouth 2 (two) times daily.  . haloperidol (HALDOL) 5 MG tablet Take 5 mg by mouth at bedtime.  . haloperidol decanoate (HALDOL DECANOATE) 100 MG/ML injection Inject into the muscle every 28 (twenty-eight) days.  Marland Kitchen ibuprofen (ADVIL,MOTRIN) 400 MG tablet Take 400 mg by mouth every 6 (six) hours as needed.  . solifenacin (VESICARE) 10 MG tablet Take by mouth daily.  . traZODone (DESYREL) 50 MG tablet Take 50 mg by mouth at bedtime. Takes 3 tabs at bedtime    Allergy: No Known Allergies  Social Hx:   History   Social History  . Marital Status: Legally Separated    Spouse Name: N/A    Number of Children: N/A  . Years of Education: N/A   Occupational History  . Not on file.   Social History Main Topics  . Smoking status: Current Every Day Smoker -- 1.00 packs/day for 15 years    Types: Cigars  . Smokeless  tobacco: Former Systems developer    Types: Snuff, Chew  . Alcohol Use: No  . Drug Use: No  . Sexual Activity: No   Other Topics Concern  . Not on file   Social History Narrative  . No narrative on file    Past Surgical Hx:  Past Surgical History  Procedure Laterality Date  . Tubal ligation Bilateral     Past Medical Hx:  Past Medical History  Diagnosis Date  . Chronic schizophrenia   . Trouble in  sleeping   . Depression   . Vulvar mass 11/07/2013    Past Gynecological History:  G3P3, SVD x3  Patient's last menstrual period was 10/31/2013.  Family Hx:  Family History  Problem Relation Age of Onset  . Seizures Mother   . Hypertension Sister     Review of Systems:  Constitutional  Feels well,    ENT Normal appearing ears and nares bilaterally Skin/Breast  No rash, sores, jaundice, itching, dryness Cardiovascular  No chest pain, shortness of breath, or edema  Pulmonary  No cough or wheeze.  Gastro Intestinal  No nausea, vomitting, or diarrhoea. No bright red blood per rectum, no abdominal pain, change in bowel movement, or constipation.  Genito Urinary  No frequency, urgency, dysuria, see HPI Musculo Skeletal  No myalgia, arthralgia, joint swelling or pain  Neurologic  No weakness, numbness, change in gait,  Psychology  No depression, anxiety, insomnia.   Vitals:  Blood pressure 104/78, pulse 72, temperature 97.9 F (36.6 C), temperature source Oral, resp. rate 18, height 5\' 3"  (1.6 m), weight 119 lb 6.4 oz (54.159 kg), last menstrual period 10/31/2013.  Physical Exam: WD in NAD Neck  Supple NROM, without any enlargements.  Lymph Node Survey No cervical supraclavicular or inguinal adenopathy Cardiovascular  Pulse normal rate, regularity and rhythm. S1 and S2 normal.  Lungs  Clear to auscultation bilateraly, without wheezes/crackles/rhonchi. Good air movement.  Skin  No rash/lesions/breakdown  Psychiatry  Alert and oriented to person, place, and time  Abdomen  Normoactive bowel sounds, abdomen soft, non-tender and thin without evidence of hernia.  Back No CVA tenderness Genito Urinary  Vulva/vagina: 5-6cm round/oval lesion (raised, red, friable) on the posterior right labia majora/perineal body which extends to 1cm of the anal verge and involves the distal 1cm of vagina and introitus. There is associated erythematous and white raised lesion with irregular  borders that extends circumferentially around the labia minora bilaterally extending to the clitoris anteriorally (it is also suspicious for invasive cancer vs VIN3). The tumor is not fixed to underlying tissues. Vulva biopsy: 2cc of 1% lidocaine was infiltrated into the anterior right labia minora (into this site of leukoplakia) and a 50mm punch biopsy of this tissue was taken. Hemostasis was achieved with silver nitrate.    Bladder/urethra:  No lesions or masses, well supported bladder  Vagina: distal 1cm of right vagina involved with lesion.  Cervix: Normal appearing, no lesions.  Uterus: deferred exam  Adnexa: deferred exam = Rectal  Good tone, no masses no cul de sac nodularity.  Extremities  No bilateral cyanosis, clubbing or edema.   Donaciano Eva, MD   12/03/2013, 12:28 PM

## 2013-12-06 ENCOUNTER — Encounter: Payer: Self-pay | Admitting: Radiation Oncology

## 2013-12-06 ENCOUNTER — Encounter (HOSPITAL_COMMUNITY): Payer: Self-pay

## 2013-12-06 ENCOUNTER — Ambulatory Visit (HOSPITAL_COMMUNITY)
Admission: RE | Admit: 2013-12-06 | Discharge: 2013-12-06 | Disposition: A | Discharge status: Home / Self Care | Payer: Medicare Other | Source: Ambulatory Visit | Attending: Diagnostic Radiology | Admitting: Diagnostic Radiology

## 2013-12-06 DIAGNOSIS — C519 Malignant neoplasm of vulva, unspecified: Secondary | ICD-10-CM

## 2013-12-06 DIAGNOSIS — R911 Solitary pulmonary nodule: Secondary | ICD-10-CM | POA: Diagnosis not present

## 2013-12-06 DIAGNOSIS — N83209 Unspecified ovarian cyst, unspecified side: Secondary | ICD-10-CM | POA: Diagnosis not present

## 2013-12-06 DIAGNOSIS — D071 Carcinoma in situ of vulva: Secondary | ICD-10-CM | POA: Insufficient documentation

## 2013-12-06 DIAGNOSIS — R599 Enlarged lymph nodes, unspecified: Secondary | ICD-10-CM | POA: Diagnosis not present

## 2013-12-06 DIAGNOSIS — C579 Malignant neoplasm of female genital organ, unspecified: Secondary | ICD-10-CM | POA: Diagnosis not present

## 2013-12-06 DIAGNOSIS — N9089 Other specified noninflammatory disorders of vulva and perineum: Secondary | ICD-10-CM | POA: Diagnosis not present

## 2013-12-06 LAB — GLUCOSE, CAPILLARY: Glucose-Capillary: 99 mg/dL (ref 70–99)

## 2013-12-06 MED ORDER — FLUDEOXYGLUCOSE F - 18 (FDG) INJECTION: 5.9700 | Freq: Once | INTRAVENOUS | Status: AC | PRN

## 2013-12-06 NOTE — Progress Notes (Signed)
GYN Location of Tumor / Histology:  squamous cell cancer of the vulva, clinical stage II  Briana Wiley presented when she began experiencing vulvar irritation in the past few months.  Biopsies revealed:  11/07/13 Diagnosis Vulva, biopsy AT LEAST HIGH GRADE VULVAR INTRAEPITHELIAL NEOPLASIA ASSOCIATED WITH AN AREA HIGHLY SUSPICIOUS FOR SQUAMOUS CELL CARCINOMA.  12/02/13 Diagnosis Labium, biopsy, left anterior labia minora - SEVERE SQUAMOUS DYSPLASIA, VIN-III INVOLVING THE EDGE OF THE BIOPSY.  Past/Anticipated interventions by Gyn/Onc surgery, if any: biopsy 10/02/13, no plans for surgery due to patient's social situation and smoking habit.  Past/Anticipated interventions by medical oncology, if any: no  Weight changes, if any: has lost 50 lbs in last 6 months. States food does not taste good.  Bowel/Bladder complaints, if any: constipation.  no bladder issues  Nausea/Vomiting, if any: no  Pain issues, if any:  Has soreness in her vaginal area.  OB/GYN history: G3P3, SVD x3 Patient's last menstrual period was 10/31/2013.  SAFETY ISSUES:  Prior radiation? no  Pacemaker/ICD? no  Possible current pregnancy? no  Is the patient on methotrexate? no  Current Complaints / other details:  Patient has a history of paranoid schizophrenia.and smokes 1-2 packs per day.  Patient lives in a group home. She is here with her caregiver today.  Patient reports having vaginal bleeding today. She reports she "spots" on and off.

## 2013-12-10 ENCOUNTER — Telehealth: Payer: Self-pay | Admitting: Gynecologic Oncology

## 2013-12-10 NOTE — Telephone Encounter (Addendum)
Message copied by Thereasa Solo on Tue Dec 10, 2013  5:25 PM ------      Message from: Joylene John D      Created: Fri Dec 06, 2013  4:30 PM       PET scan.       ----- Message -----         From: Rad Results In Interface         Sent: 12/06/2013   1:23 PM           To: Dorothyann Gibbs, NP                   ------ called patient and left message to call us back regarding PET. The PET shows involvement of the pelvic and inguinal lymph nodes. This needs to be treated with the radiation that she will be getting to the vulva (extended field radiation). Donaciano Eva, MD

## 2013-12-11 ENCOUNTER — Ambulatory Visit
Admission: RE | Admit: 2013-12-11 | Discharge: 2013-12-11 | Disposition: A | Discharge status: Home / Self Care | Payer: Medicare Other | Source: Ambulatory Visit | Attending: Radiation Oncology | Admitting: Radiation Oncology

## 2013-12-11 ENCOUNTER — Encounter: Payer: Self-pay | Admitting: Radiation Oncology

## 2013-12-11 VITALS — BP 126/84 | HR 73 | Temp 97.9°F | Ht 63.0 in | Wt 119.9 lb

## 2013-12-11 DIAGNOSIS — F329 Major depressive disorder, single episode, unspecified: Secondary | ICD-10-CM | POA: Insufficient documentation

## 2013-12-11 DIAGNOSIS — F3289 Other specified depressive episodes: Secondary | ICD-10-CM | POA: Insufficient documentation

## 2013-12-11 DIAGNOSIS — Z8544 Personal history of malignant neoplasm of other female genital organs: Secondary | ICD-10-CM | POA: Diagnosis not present

## 2013-12-11 DIAGNOSIS — C792 Secondary malignant neoplasm of skin: Secondary | ICD-10-CM | POA: Insufficient documentation

## 2013-12-11 DIAGNOSIS — C779 Secondary and unspecified malignant neoplasm of lymph node, unspecified: Secondary | ICD-10-CM

## 2013-12-11 DIAGNOSIS — Z510 Encounter for antineoplastic radiation therapy: Secondary | ICD-10-CM | POA: Insufficient documentation

## 2013-12-11 DIAGNOSIS — F17200 Nicotine dependence, unspecified, uncomplicated: Secondary | ICD-10-CM | POA: Insufficient documentation

## 2013-12-11 DIAGNOSIS — C519 Malignant neoplasm of vulva, unspecified: Secondary | ICD-10-CM | POA: Diagnosis not present

## 2013-12-11 DIAGNOSIS — C50919 Malignant neoplasm of unspecified site of unspecified female breast: Secondary | ICD-10-CM | POA: Insufficient documentation

## 2013-12-11 DIAGNOSIS — Z8541 Personal history of malignant neoplasm of cervix uteri: Secondary | ICD-10-CM | POA: Diagnosis not present

## 2013-12-11 DIAGNOSIS — F205 Residual schizophrenia: Secondary | ICD-10-CM | POA: Insufficient documentation

## 2013-12-11 LAB — CBC WITH DIFFERENTIAL/PLATELET
BASO%: 0.6 % (ref 0.0–2.0)
BASOS ABS: 0.1 10*3/uL (ref 0.0–0.1)
EOS ABS: 0 10*3/uL (ref 0.0–0.5)
EOS%: 0.4 % (ref 0.0–7.0)
HCT: 47.3 % — ABNORMAL HIGH (ref 34.8–46.6)
HEMOGLOBIN: 15.6 g/dL (ref 11.6–15.9)
LYMPH%: 17.8 % (ref 14.0–49.7)
MCH: 29.9 pg (ref 25.1–34.0)
MCHC: 33 g/dL (ref 31.5–36.0)
MCV: 90.4 fL (ref 79.5–101.0)
MONO#: 0.5 10*3/uL (ref 0.1–0.9)
MONO%: 5.2 % (ref 0.0–14.0)
NEUT%: 76 % (ref 38.4–76.8)
NEUTROS ABS: 7.8 10*3/uL — AB (ref 1.5–6.5)
PLATELETS: 194 10*3/uL (ref 145–400)
RBC: 5.23 10*6/uL (ref 3.70–5.45)
RDW: 16 % — AB (ref 11.2–14.5)
WBC: 10.3 10*3/uL (ref 3.9–10.3)
lymph#: 1.8 10*3/uL (ref 0.9–3.3)

## 2013-12-11 LAB — COMPREHENSIVE METABOLIC PANEL (CC13)
ALBUMIN: 3.8 g/dL (ref 3.5–5.0)
ALK PHOS: 136 U/L (ref 40–150)
ALT: <6 U/L (ref 0–55)
AST: 10 U/L (ref 5–34)
Anion Gap: 8 mEq/L (ref 3–11)
BUN: 5.9 mg/dL — ABNORMAL LOW (ref 7.0–26.0)
CALCIUM: 9.6 mg/dL (ref 8.4–10.4)
CO2: 27 mEq/L (ref 22–29)
CREATININE: 0.8 mg/dL (ref 0.6–1.1)
Chloride: 108 mEq/L (ref 98–109)
GLUCOSE: 88 mg/dL (ref 70–140)
POTASSIUM: 4.3 meq/L (ref 3.5–5.1)
Sodium: 144 mEq/L (ref 136–145)
Total Bilirubin: 0.27 mg/dL (ref 0.20–1.20)
Total Protein: 6.9 g/dL (ref 6.4–8.3)

## 2013-12-11 LAB — MAGNESIUM (CC13): MAGNESIUM: 2.4 mg/dL (ref 1.5–2.5)

## 2013-12-11 NOTE — Progress Notes (Signed)
Please see the Nurse Progress Note in the MD Initial Consult Encounter for this patient. 

## 2013-12-11 NOTE — Progress Notes (Signed)
Radiation Oncology         [phone] ________________________________  Initial outpatient Consultation  Name: Briana Wiley MRN: [id number]  Date: 12/11/2013  DOB: 09-30-1970  BM:WUXLK,GMWNUUV, MD  [name], MD   REFERRING PHYSICIAN: [name], MD  DIAGNOSIS: Clinical stage II Vulvar squamous cell carcinoma with PET scan showing inguinal and pelvic metastasis   HISTORY OF PRESENT ILLNESS::Briana Wiley is a 43 y.o. female who is seen out of the courtesy of Dr. Denman George for an opinion concerning radiation therapy as part of management of patient's advanced vulvar carcinoma.  the patient presented with a several month history of perineal irritation and itching. 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 The patient was seen by Dr. Elonda Husky and a 5 cm erythematous vulvar mass was noted. This was biopsied and pathology revealed highly suspicious for squamous cell carcinoma. The patient was referred to Dr. Denman George and confirmed clinical stage II vulvar carcinoma.  Exam revealed a 5-6 cm raised red friable lesion along the posterior right labia majora/perineal body. This extended close to the anal verge and involves the distal 1 cm of the vaginal introitus.  The patient was not felt to be a good candidate for surgical intervention given the patient's social situation (the patient is a chronic history of paranoid schizophrenia, tobacco abuse and resides in a long-term care facility),  advanced stage and significant smoking history.  A PET scan was performed which showed metastasis to the inguinal and pelvic regions which confirmed the necessity for radiation therapy as part of her overall management and given the patient's significant smoking history likely have poor wound healing after surgery followed by adjuvant radiation therapy.  The patient is now seen in radiation oncology for definitive treatment.   PREVIOUS RADIATION THERAPY: No  PAST  MEDICAL HISTORY:  has a past medical history of Chronic schizophrenia; Trouble in sleeping; Depression; and Vulvar mass (11/07/2013).    PAST SURGICAL HISTORY: Past Surgical History  Procedure Laterality Date  . Tubal ligation Bilateral   . Vulva / perineum biopsy  12/02/13  . Vulvectomy partial  2003    FAMILY HISTORY: family history includes Hypertension in her sister; Seizures in her mother.  SOCIAL HISTORY:  reports that she has been smoking Cigars.  She has quit using smokeless tobacco. Her smokeless tobacco use included Snuff and Chew. She reports that she does not drink alcohol or use illicit drugs.  ALLERGIES: Review of patient's allergies indicates no known allergies.  MEDICATIONS:  Current Outpatient Prescriptions  Medication Sig Dispense Refill  . benztropine (COGENTIN) 1 MG tablet Take 1 mg by mouth 2 (two) times daily.      . haloperidol (HALDOL) 5 MG tablet Take 5 mg by mouth at bedtime.      . haloperidol decanoate (HALDOL DECANOATE) 100 MG/ML injection Inject into the muscle every 28 (twenty-eight) days.      Marland Kitchen ibuprofen (ADVIL,MOTRIN) 400 MG tablet Take 400 mg by mouth every 6 (six) hours as needed.      . solifenacin (VESICARE) 10 MG tablet Take by mouth daily.      . traZODone (DESYREL) 50 MG tablet Take 50 mg by mouth at bedtime. Takes 3 tabs at bedtime       No current facility-administered medications for this encounter.    REVIEW OF SYSTEMS:  A 15 point review of systems is documented in the electronic medical record. This was obtained by the nursing  staff. However, I reviewed this with the patient to discuss relevant findings and make appropriate changes.  She complains of itching and discomfort in the perineum. She denies any constipation or diarrhea. She denies any urination difficulties. She denies any significant bleeding from the lesion or significant drainage.   PHYSICAL EXAM:  height is 5\' 3"  (1.6 m) and weight is 119 lb 14.4 oz (54.386 kg). Her oral  temperature is 97.9 F (36.6 C). Her blood pressure is 126/84 and her pulse is 73.  this is a pleasant 43 year old female in no acute distress. She is accompanied by her caregiver. Patient responds appropriately to questions. Examination of the pupils reveals them to be round and minimally reactive to light. The extraocular eye movements are intact. The tongue is midline. There is no secondary infection noted in the oral cavity or posterior pharynx. The patient has significant staining of her teeth and poor dentition. Examination of the neck and supraclavicular region reveals no evidence of adenopathy. the axillary areas are free of adenopathy. Examination of the lungs reveals them to be clear. The heart has regular rhythm and rate. the abdomen is soft and nontender with normal bowel sounds. No obvious hepatosplenomegaly. Stretch marks are noted in the lower abdominal region.  The patient has a 1 cm palpable lymph node in the right inguinal area. The left inguinal area shows shoddy adenopathy.  On pelvic examination the patient has large lesion centered along the right posterior labia majora. This lesion is raised significantly and friable without any active bleeding This extends to within 1 cm of the anal verge and extends minimally into the vaginal introitus. The lesion is estimated to be approximately a 5-6cm in size.  On speculum exam the patient is noted to have a multi-parous cervix.  The patient would not permit an adequate rectal exam but sphincter tone appeared to be normal. Examination of extremities reveals clubbing consistent with tobacco use. The patient has staining of her fingernails from tobacco use. Peripheral pulses are good. No significant edema is noted in the extremities. On neurological examination motor strength is 5 out of 5 in the proximal and distal muscle groups of the upper lower extremities.    ECOG = 1  1 - Symptomatic but completely ambulatory (Restricted in physically strenuous  activity but ambulatory and able to carry out work of a light or sedentary nature. For example, light housework, office work)  LABORATORY DATA:  Lab Results  Component Value Date   WBC 10.3 12/11/2013   HGB 15.6 12/11/2013   HCT 47.3* 12/11/2013   MCV 90.4 12/11/2013   PLT 194 12/11/2013   NEUTROABS 7.8* 12/11/2013   Lab Results  Component Value Date   NA 144 12/11/2013   K 4.3 12/11/2013   CL 110 02/04/2008   CO2 27 12/11/2013   GLUCOSE 88 12/11/2013   CREATININE 0.8 12/11/2013   CALCIUM 9.6 12/11/2013      RADIOGRAPHY: Nm Pet Image Initial (pi) Skull Base To Thigh  12/06/2013   ADDENDUM REPORT: 12/06/2013 13:21  ADDENDUM: The original report was by Dr. Van Clines. The following addendum is by Dr. Van Clines:  In addition to the previously described findings, upon same day review of today's images,I observe an indistinct 0.5 x 0.6 cm left upper lobe nodule visible only on image 28 of series 8. No hypermetabolic activity, but this is below reliable PET-CT size thresholds. This is not convincingly metastatic, but given the patient's malignancy likely warrants careful follow up with diagnostic chest  CT evaluation in the short to medium term in order to fully characterize and assess for any change.   Electronically Signed   By: Sherryl Barters M.D.   On: 12/06/2013 13:21   12/06/2013   CLINICAL DATA:  Initial treatment strategy for high-grade vulvar intraepithelial neoplasia (currently stage 3).  EXAM: NUCLEAR MEDICINE PET SKULL BASE TO THIGH  TECHNIQUE: 6.0 mCi F-18 FDG was injected intravenously. Full-ring PET imaging was performed from the skull base to thigh after the radiotracer. CT data was obtained and used for attenuation correction and anatomic localization.  FASTING BLOOD GLUCOSE:  Value: 99 mg/dl  COMPARISON:  None.  FINDINGS: NECK  No hypermetabolic lymph nodes in the neck. Mucus retention cyst in the right maxillary sinus.  CHEST  No hypermetabolic mediastinal or hilar nodes.  No suspicious pulmonary nodules on the CT scan.  ABDOMEN/PELVIS  Asymmetric 7.3 x 2.5 cm right labial mass, maximum standard uptake value 11.5. High activity along the right perianal margin, maximum standard uptake value 7.1, possibly physiologic from anal constriction although extension of tumor in this vicinity is not readily excluded.  Right inguinal lymph node 1.7 cm in short axis, maximum standard uptake value 5.4. Left inguinal lymph node 1.1 cm in short axis, maximum standard uptake value 2.3. Right external iliac node short axis diameter 1.0 cm, maximum standard uptake value 2.9. Separate right external iliac lymph node 0.7 cm in short axis, maximum standard uptake value 3.2.  Enlarged ovaries with some complex cystic lesions. Left ovary 7.9 x 3.3 cm with multiple cysts including a 4.6 cm hyperdense cyst. 4.5 cm hyperdense exophytic lesion from the right ovary. These lesions are photopenic.  Photopenic left hepatic lobe cyst 2.7 cm in long axis. Prominence of stool in the rectal vault.  SKELETON  No focal hypermetabolic activity to suggest skeletal metastasis.  IMPRESSION: 1.  PET-CT aHypermetabolic right labial mass with hypermetabolic enlarged right inguinal lymph node and faintly hypermetabolic left inguinal lymph node and right external iliac lymph nodes which are likely involved. Suspected anal involvement.ppearance compatible with T2 N2b M0 (TNM Stage IIIB, FIGO stage IIIBi). 2. Bilateral large ovarian cysts are complex but photopenic and accordingly probably not malignant. These were also observed back on the ultrasound exam from 03/13/2006.  Electronically Signed: By: Sherryl Barters M.D. On: 12/06/2013 11:20      IMPRESSION: Clinical stage II Vulvar squamous cell carcinoma with PET scan showing inguinal and pelvic metastasis.  The patient has had 2 biopsies of the lesion which showed at least high-grade vulvar intraepithelial neoplasia highly suspicious for squamous cell carcinoma. Given the  clinical picture and lymph node metastasis this would be consistent with advanced vulvar squamous cell carcinoma. Patient would be a good candidate for a definitive course of radiation therapy.  I discussed the treatment course side effects and potential toxicities of radiation therapy in this situation with the patient. She appears to understand and wishes to proceed with planned course of treatment. I am unsure whether the patient would be a candidate for radiosensitizing chemotherapy given the patient's social situation but will request medical oncology referral concerning this issue.  PLAN: Simulation and planning early next week. Anticipate approximately 6 weeks of radiation therapy as part of the patient's management. The patient will be treated with intensity modulated radiation therapy so as to to  cover the inguinal areas and to limit dose to the femoral head/neck areas and to adequately cover her lymph node metastasis limiting dose to the small bowel. I spent 60  minutes minutes face to face with the patient and more than 50% of that time was spent in counseling and/or coordination of care.   ------------------------------------------------  Blair Promise, PhD, MD

## 2013-12-17 ENCOUNTER — Ambulatory Visit
Admission: RE | Admit: 2013-12-17 | Discharge: 2013-12-17 | Disposition: A | Discharge status: Home / Self Care | Payer: Medicare Other | Source: Ambulatory Visit | Attending: Radiation Oncology | Admitting: Radiation Oncology

## 2013-12-17 VITALS — BP 126/77 | HR 77 | Resp 16 | Wt 117.1 lb

## 2013-12-17 DIAGNOSIS — C519 Malignant neoplasm of vulva, unspecified: Secondary | ICD-10-CM

## 2013-12-17 DIAGNOSIS — C779 Secondary and unspecified malignant neoplasm of lymph node, unspecified: Secondary | ICD-10-CM | POA: Diagnosis not present

## 2013-12-17 DIAGNOSIS — F17200 Nicotine dependence, unspecified, uncomplicated: Secondary | ICD-10-CM | POA: Diagnosis not present

## 2013-12-17 DIAGNOSIS — C50919 Malignant neoplasm of unspecified site of unspecified female breast: Secondary | ICD-10-CM | POA: Diagnosis not present

## 2013-12-17 DIAGNOSIS — F205 Residual schizophrenia: Secondary | ICD-10-CM | POA: Diagnosis not present

## 2013-12-17 DIAGNOSIS — C792 Secondary malignant neoplasm of skin: Secondary | ICD-10-CM | POA: Diagnosis not present

## 2013-12-17 DIAGNOSIS — Z510 Encounter for antineoplastic radiation therapy: Secondary | ICD-10-CM | POA: Diagnosis not present

## 2013-12-17 MED ORDER — SODIUM CHLORIDE 0.9 % IJ SOLN
10.0000 mL | Freq: Once | INTRAMUSCULAR | Status: AC
  Administered 2013-12-17: 10 mL via INTRAVENOUS

## 2013-12-17 NOTE — Addendum Note (Signed)
Encounter addended by: Heywood Footman, RN on: 12/17/2013 11:10 AM<BR>     Documentation filed: Notes Section

## 2013-12-17 NOTE — Progress Notes (Signed)
Reviewed treatment calendar with patient's caregiver. Also, reviewed contrast extravasation care sheet with caregiver. Reinforced importance of remaining still during scans and treatment. Both verbalized understanding of all reviewed.

## 2013-12-17 NOTE — Progress Notes (Signed)
  Radiation Oncology         [phone] ________________________________  Name: Briana Wiley MRN: [id number]  Date: 12/17/2013  DOB: 1970/10/11  SIMULATION AND TREATMENT PLANNING NOTE  DIAGNOSIS:  Clinical stage II Vulvar squamous cell carcinoma with PET scan showing inguinal and pelvic metastasis   NARRATIVE:  The patient was brought to the [facility].  Identity was confirmed.  All relevant records and images related to the planned course of therapy were reviewed.  The patient freely provided informed written consent to proceed with treatment after reviewing the details related to the planned course of therapy. The consent form was witnessed and verified by the simulation staff.  Then, the patient was set-up in a stable reproducible  Supine" frogleg" position for radiation therapy. Rectal contrast was used for the procedure. IV contrast was attempted but the patient's IV infiltrated during the CT scan.   CT images were obtained.  Surface markings were placed.  The CT images were loaded into the planning software.  Then the target and avoidance structures were contoured.  Treatment planning then occurred.  The radiation prescription was entered and confirmed.  Then, I designed and supervised the construction of a total of 1 medically necessary complex treatment devices.  I have requested : Intensity Modulated Radiotherapy (IMRT) is medically necessary for this case for the following reason:  Small bowel sparing, femoral head/neck sparing in light of the necessity to cover the inguinal areas.   I have ordered:dose calc.  PLAN:  The patient will receive 60 Gy in 30 fractions.  The patient may also receive radiosensitizing chemotherapy (medical oncology consultation is pending).  ________________________________ -----------------------------------  Blair Promise, PhD, MD

## 2013-12-17 NOTE — Progress Notes (Signed)
Started left forearm 22 gauge IV on the second attempt. Patient tolerated well. Secured IV in place. Excellent blood return. Flushed without complication. Patient denies allergy to contrast. Patient reports she has no known drug allergies. BUN low. Creatinine WDL.

## 2013-12-17 NOTE — Addendum Note (Signed)
Encounter addended by: Heywood Footman, RN on: 12/17/2013 11:08 AM<BR>     Documentation filed: Notes Section

## 2013-12-17 NOTE — Progress Notes (Addendum)
Called to CT/SIM. Aaron Edelman, RT reports saline flushed without difficulty but, following 15 cc of contrast the IV infiltrated. IV pulled immediately. Patient denies pain. Catheter intact upon removal. Applied an occlusive dressing to old IV site. Raised area approximately the size of a dime noted at old injection site. Outpatient take home instructions for contrast extravasation during procedure reviewed and given to patient. Patient verbalized understanding. Physician, Dr. Sondra Come, informed.

## 2013-12-18 ENCOUNTER — Ambulatory Visit: Payer: Medicare Other | Admitting: Radiation Oncology

## 2013-12-20 ENCOUNTER — Telehealth: Payer: Self-pay | Admitting: Radiation Oncology

## 2013-12-20 NOTE — Telephone Encounter (Signed)
Following up with patient after IV extravasation. Spoke with [name], owner of [facility]. She reports the patient is unavailable at this time but, she is her caregiver. [name] explains, "there was no bruising at the site and the patient has not complained of any pain." She expressed appreciation for the call.

## 2013-12-24 NOTE — Addendum Note (Signed)
Encounter addended by: Jacqulyn Liner, RN on: 12/24/2013  9:28 AM<BR>     Documentation filed: Charges VN

## 2013-12-25 DIAGNOSIS — F205 Residual schizophrenia: Secondary | ICD-10-CM | POA: Diagnosis not present

## 2013-12-25 DIAGNOSIS — C792 Secondary malignant neoplasm of skin: Secondary | ICD-10-CM | POA: Diagnosis not present

## 2013-12-25 DIAGNOSIS — C50919 Malignant neoplasm of unspecified site of unspecified female breast: Secondary | ICD-10-CM | POA: Diagnosis not present

## 2013-12-25 DIAGNOSIS — Z510 Encounter for antineoplastic radiation therapy: Secondary | ICD-10-CM | POA: Diagnosis not present

## 2013-12-25 DIAGNOSIS — F17200 Nicotine dependence, unspecified, uncomplicated: Secondary | ICD-10-CM | POA: Diagnosis not present

## 2013-12-25 DIAGNOSIS — C519 Malignant neoplasm of vulva, unspecified: Secondary | ICD-10-CM | POA: Diagnosis not present

## 2013-12-26 ENCOUNTER — Ambulatory Visit
Admission: RE | Admit: 2013-12-26 | Discharge: 2013-12-26 | Disposition: A | Discharge status: Home / Self Care | Payer: Medicare Other | Source: Ambulatory Visit | Attending: Radiation Oncology | Admitting: Radiation Oncology

## 2013-12-26 DIAGNOSIS — Z510 Encounter for antineoplastic radiation therapy: Secondary | ICD-10-CM | POA: Diagnosis not present

## 2013-12-26 DIAGNOSIS — C50919 Malignant neoplasm of unspecified site of unspecified female breast: Secondary | ICD-10-CM | POA: Diagnosis not present

## 2013-12-26 DIAGNOSIS — F17200 Nicotine dependence, unspecified, uncomplicated: Secondary | ICD-10-CM | POA: Diagnosis not present

## 2013-12-26 DIAGNOSIS — C792 Secondary malignant neoplasm of skin: Secondary | ICD-10-CM | POA: Diagnosis not present

## 2013-12-26 DIAGNOSIS — C519 Malignant neoplasm of vulva, unspecified: Secondary | ICD-10-CM | POA: Diagnosis not present

## 2013-12-26 DIAGNOSIS — F205 Residual schizophrenia: Secondary | ICD-10-CM | POA: Diagnosis not present

## 2013-12-27 ENCOUNTER — Ambulatory Visit
Admission: RE | Admit: 2013-12-27 | Discharge: 2013-12-27 | Disposition: A | Discharge status: Home / Self Care | Payer: Medicare Other | Source: Ambulatory Visit | Attending: Radiation Oncology | Admitting: Radiation Oncology

## 2013-12-27 ENCOUNTER — Encounter: Payer: Self-pay | Admitting: *Deleted

## 2013-12-27 DIAGNOSIS — C50919 Malignant neoplasm of unspecified site of unspecified female breast: Secondary | ICD-10-CM | POA: Diagnosis not present

## 2013-12-27 DIAGNOSIS — F17200 Nicotine dependence, unspecified, uncomplicated: Secondary | ICD-10-CM | POA: Diagnosis not present

## 2013-12-27 DIAGNOSIS — F205 Residual schizophrenia: Secondary | ICD-10-CM | POA: Diagnosis not present

## 2013-12-27 DIAGNOSIS — C519 Malignant neoplasm of vulva, unspecified: Secondary | ICD-10-CM | POA: Diagnosis not present

## 2013-12-27 DIAGNOSIS — C792 Secondary malignant neoplasm of skin: Secondary | ICD-10-CM | POA: Diagnosis not present

## 2013-12-27 DIAGNOSIS — Z510 Encounter for antineoplastic radiation therapy: Secondary | ICD-10-CM | POA: Diagnosis not present

## 2013-12-27 NOTE — Progress Notes (Signed)
[facility] Psychosocial Distress Screening Clinical Social Work  Clinical Social Work was referred by distress screening protocol.  The patient scored a 7 on the Psychosocial Distress Thermometer which indicates moderate distress. Clinical Social Worker met with pt after her radiation appointment to assess for distress and other psychosocial needs. Pt lives at a group home in [place]. She reports to live there for the last 17 years and feels comfortable there. Pt was escorted by a caregiver today, but CSW ws able to meet with pt alone after treatment. Pt shared she was scared before starting treatment, but now is less anxious. She did share she was "ready to get it over with". CSW validated her emotions and let her know many patients experience similar feelings. Pt currently takes antipsychotics and these are given to her at her group home. She recieves mental health treatment from [place], [place]. Pt denied any concerns currently, but CSW provided her and her caregiver with Support Team and info sheet and how to reach CSW as needed. Pt is transported by her group home and this is working out well currently. Her caregiver may have to bring another resident with her when she brings pt to treatment.   ONCBCN DISTRESS SCREENING 12/11/2013  Screening Type Initial Screening  Elta Guadeloupe the number that describes how much distress you have been experiencing in the past week 7  Emotional problem type Depression;Nervousness/Anxiety;Adjusting to illness;Boredom  Physical Problem type Pain;Sleep/insomnia;Loss of appetitie  Physician notified of physical symptoms Yes    Clinical Social Worker follow up needed: No.  If yes, follow up plan:  CSW available as needed, pt and caregiver aware.  Loren Racer, [place] Worker [facility]  [place] Phone: [phone] Fax: [phone]

## 2013-12-31 ENCOUNTER — Ambulatory Visit
Admission: RE | Admit: 2013-12-31 | Discharge: 2013-12-31 | Disposition: A | Discharge status: Home / Self Care | Payer: Medicare Other | Source: Ambulatory Visit | Attending: Radiation Oncology | Admitting: Radiation Oncology

## 2013-12-31 DIAGNOSIS — Z510 Encounter for antineoplastic radiation therapy: Secondary | ICD-10-CM | POA: Diagnosis not present

## 2013-12-31 DIAGNOSIS — C50919 Malignant neoplasm of unspecified site of unspecified female breast: Secondary | ICD-10-CM | POA: Diagnosis not present

## 2013-12-31 DIAGNOSIS — C519 Malignant neoplasm of vulva, unspecified: Secondary | ICD-10-CM | POA: Diagnosis not present

## 2013-12-31 DIAGNOSIS — F17200 Nicotine dependence, unspecified, uncomplicated: Secondary | ICD-10-CM | POA: Diagnosis not present

## 2013-12-31 DIAGNOSIS — F205 Residual schizophrenia: Secondary | ICD-10-CM | POA: Diagnosis not present

## 2013-12-31 DIAGNOSIS — C792 Secondary malignant neoplasm of skin: Secondary | ICD-10-CM | POA: Diagnosis not present

## 2014-01-01 ENCOUNTER — Encounter: Payer: Self-pay | Admitting: Radiation Oncology

## 2014-01-01 ENCOUNTER — Ambulatory Visit
Admission: RE | Admit: 2014-01-01 | Discharge: 2014-01-01 | Disposition: A | Discharge status: Home / Self Care | Payer: Medicare Other | Source: Ambulatory Visit | Attending: Radiation Oncology | Admitting: Radiation Oncology

## 2014-01-01 VITALS — BP 117/74 | HR 87 | Temp 97.9°F | Ht 63.0 in | Wt 115.0 lb

## 2014-01-01 DIAGNOSIS — F205 Residual schizophrenia: Secondary | ICD-10-CM | POA: Diagnosis not present

## 2014-01-01 DIAGNOSIS — C519 Malignant neoplasm of vulva, unspecified: Secondary | ICD-10-CM | POA: Diagnosis not present

## 2014-01-01 DIAGNOSIS — C792 Secondary malignant neoplasm of skin: Secondary | ICD-10-CM | POA: Diagnosis not present

## 2014-01-01 DIAGNOSIS — F17200 Nicotine dependence, unspecified, uncomplicated: Secondary | ICD-10-CM | POA: Diagnosis not present

## 2014-01-01 DIAGNOSIS — Z510 Encounter for antineoplastic radiation therapy: Secondary | ICD-10-CM | POA: Diagnosis not present

## 2014-01-01 DIAGNOSIS — C50919 Malignant neoplasm of unspecified site of unspecified female breast: Secondary | ICD-10-CM | POA: Diagnosis not present

## 2014-01-01 MED ORDER — PROCHLORPERAZINE MALEATE 10 MG PO TABS: 10.0000 mg | ORAL_TABLET | Freq: Four times a day (QID) | ORAL | Status: DC | PRN

## 2014-01-01 MED ORDER — HYDROCODONE-ACETAMINOPHEN 5-325 MG PO TABS: 1.0000 | ORAL_TABLET | ORAL | Status: DC | PRN

## 2014-01-01 NOTE — Progress Notes (Addendum)
Briana Wiley has completed 4 fractions to her pelvis/inguinal node and is here with her caregiver.  She denies pain but does have soreness in her vaginal area with a small amount of bleeding and vaginal discharge.  On inspection, the vaginal area is red.  She is wearing depends.  She reports nausea after treatment and does not have any nausea medication.  She has lost 2 lbs since 8/25.  She denies any bladder and bowel issues.  Tifani was given the Radiation Therapy and You book and reviewed potential side effects/management of fatigue, diarrhea, nausea, skin changes and bladder changes.  She was given a sitz bath and was instructed on how to use it.  She was advised to call with any questions.

## 2014-01-01 NOTE — Progress Notes (Signed)
  Radiation Oncology         [phone] ________________________________  Name: RHILYN BATTLE MRN: [id number]  Date: 01/01/2014  DOB: 12-08-1970  Weekly Radiation Therapy Management  DIAGNOSIS: Clinical stage II Vulvar squamous cell carcinoma with PET scan showing inguinal and pelvic metastasis    Current Dose: 8.8 Gy     Planned Dose:  55 + Gy  Narrative . . . . . . . . The patient presents for routine under treatment assessment.                                   The patient is without complaint. She is having some discomfort in the perineum and I've given her hydrocodone for this issue. She also reports nausea after treatment. Patient is been given prescription for Compazine for this issue.                                 Set-up films were reviewed.                                 The chart was checked. Physical Findings. . .  height is 5\' 3"  (1.6 m) and weight is 115 lb (52.164 kg). Her oral temperature is 97.9 F (36.6 C). Her blood pressure is 117/74 and her pulse is 87. . Weight essentially stable.  No significant changes. Impression . . . . . . . The patient is tolerating radiation. Plan . . . . . . . . . . . . Continue treatment as planned.  ________________________________   Blair Promise, PhD, MD

## 2014-01-02 ENCOUNTER — Ambulatory Visit
Admission: RE | Admit: 2014-01-02 | Discharge: 2014-01-02 | Disposition: A | Discharge status: Home / Self Care | Payer: Medicare Other | Source: Ambulatory Visit | Attending: Radiation Oncology | Admitting: Radiation Oncology

## 2014-01-02 DIAGNOSIS — C519 Malignant neoplasm of vulva, unspecified: Secondary | ICD-10-CM | POA: Diagnosis not present

## 2014-01-02 DIAGNOSIS — F17200 Nicotine dependence, unspecified, uncomplicated: Secondary | ICD-10-CM | POA: Diagnosis not present

## 2014-01-02 DIAGNOSIS — C50919 Malignant neoplasm of unspecified site of unspecified female breast: Secondary | ICD-10-CM | POA: Diagnosis not present

## 2014-01-02 DIAGNOSIS — F205 Residual schizophrenia: Secondary | ICD-10-CM | POA: Diagnosis not present

## 2014-01-02 DIAGNOSIS — C792 Secondary malignant neoplasm of skin: Secondary | ICD-10-CM | POA: Diagnosis not present

## 2014-01-02 DIAGNOSIS — Z510 Encounter for antineoplastic radiation therapy: Secondary | ICD-10-CM | POA: Diagnosis not present

## 2014-01-03 ENCOUNTER — Ambulatory Visit
Admission: RE | Admit: 2014-01-03 | Discharge: 2014-01-03 | Disposition: A | Discharge status: Home / Self Care | Payer: Medicare Other | Source: Ambulatory Visit | Attending: Radiation Oncology | Admitting: Radiation Oncology

## 2014-01-03 DIAGNOSIS — C519 Malignant neoplasm of vulva, unspecified: Secondary | ICD-10-CM

## 2014-01-03 DIAGNOSIS — C779 Secondary and unspecified malignant neoplasm of lymph node, unspecified: Secondary | ICD-10-CM | POA: Diagnosis not present

## 2014-01-03 DIAGNOSIS — F205 Residual schizophrenia: Secondary | ICD-10-CM | POA: Diagnosis not present

## 2014-01-03 DIAGNOSIS — C50919 Malignant neoplasm of unspecified site of unspecified female breast: Secondary | ICD-10-CM | POA: Diagnosis not present

## 2014-01-03 DIAGNOSIS — C792 Secondary malignant neoplasm of skin: Secondary | ICD-10-CM | POA: Diagnosis not present

## 2014-01-03 DIAGNOSIS — F17200 Nicotine dependence, unspecified, uncomplicated: Secondary | ICD-10-CM | POA: Diagnosis not present

## 2014-01-03 DIAGNOSIS — Z510 Encounter for antineoplastic radiation therapy: Secondary | ICD-10-CM | POA: Diagnosis not present

## 2014-01-03 MED ORDER — BIAFINE EX EMUL
Freq: Once | CUTANEOUS | Status: AC
  Administered 2014-01-03: 12:00:00 via TOPICAL

## 2014-01-03 NOTE — Progress Notes (Signed)
Patient reports itching in her pelvic and vaginal area.  Gave her biafine cream and instructed her to use it externally only.  Also told her to use a small amount twice a day, after treatment and bedtime.  Briana Wiley verbalized understanding.  Also told her she can use desitin cream as needed but to make sure it was not on her skin 4 hours prior to treatment.

## 2014-01-06 ENCOUNTER — Ambulatory Visit
Admission: RE | Admit: 2014-01-06 | Discharge: 2014-01-06 | Disposition: A | Discharge status: Home / Self Care | Payer: Medicare Other | Source: Ambulatory Visit | Attending: Radiation Oncology | Admitting: Radiation Oncology

## 2014-01-06 DIAGNOSIS — F205 Residual schizophrenia: Secondary | ICD-10-CM | POA: Diagnosis not present

## 2014-01-06 DIAGNOSIS — F17200 Nicotine dependence, unspecified, uncomplicated: Secondary | ICD-10-CM | POA: Diagnosis not present

## 2014-01-06 DIAGNOSIS — C519 Malignant neoplasm of vulva, unspecified: Secondary | ICD-10-CM | POA: Diagnosis not present

## 2014-01-06 DIAGNOSIS — Z510 Encounter for antineoplastic radiation therapy: Secondary | ICD-10-CM | POA: Diagnosis not present

## 2014-01-06 DIAGNOSIS — C792 Secondary malignant neoplasm of skin: Secondary | ICD-10-CM | POA: Diagnosis not present

## 2014-01-06 DIAGNOSIS — C50919 Malignant neoplasm of unspecified site of unspecified female breast: Secondary | ICD-10-CM | POA: Diagnosis not present

## 2014-01-07 ENCOUNTER — Ambulatory Visit
Admission: RE | Admit: 2014-01-07 | Discharge: 2014-01-07 | Disposition: A | Discharge status: Home / Self Care | Payer: Medicare Other | Source: Ambulatory Visit | Attending: Radiation Oncology | Admitting: Radiation Oncology

## 2014-01-07 ENCOUNTER — Encounter: Payer: Self-pay | Admitting: Radiation Oncology

## 2014-01-07 VITALS — BP 125/90 | HR 82 | Temp 98.7°F | Ht 63.0 in | Wt 118.3 lb

## 2014-01-07 DIAGNOSIS — Z510 Encounter for antineoplastic radiation therapy: Secondary | ICD-10-CM | POA: Diagnosis not present

## 2014-01-07 DIAGNOSIS — C50919 Malignant neoplasm of unspecified site of unspecified female breast: Secondary | ICD-10-CM | POA: Diagnosis not present

## 2014-01-07 DIAGNOSIS — C519 Malignant neoplasm of vulva, unspecified: Secondary | ICD-10-CM | POA: Diagnosis not present

## 2014-01-07 DIAGNOSIS — C792 Secondary malignant neoplasm of skin: Secondary | ICD-10-CM | POA: Diagnosis not present

## 2014-01-07 DIAGNOSIS — F17200 Nicotine dependence, unspecified, uncomplicated: Secondary | ICD-10-CM | POA: Diagnosis not present

## 2014-01-07 DIAGNOSIS — C779 Secondary and unspecified malignant neoplasm of lymph node, unspecified: Secondary | ICD-10-CM | POA: Diagnosis not present

## 2014-01-07 DIAGNOSIS — F205 Residual schizophrenia: Secondary | ICD-10-CM | POA: Diagnosis not present

## 2014-01-07 NOTE — Progress Notes (Signed)
Briana Wiley has completed 8 fractions to her pelvis. She denies pain but reports soreness in her vaginal area. She is using her sitz bath often.  She denies any rectal/vaginal bleeding and nausea.  She reports having diarrhea yesterday with 2 loose stools.  Recommended using Imodium as needed.  She reports feeling weak and fatigued.  Her weight is up 3 lbs from last week.  She reports the biafine cream is helping with itching.

## 2014-01-07 NOTE — Progress Notes (Signed)
  Radiation Oncology         [phone] ________________________________  Name: Briana Wiley MRN: [id number]  Date: 01/07/2014  DOB: Apr 04, 1971  Weekly Radiation Therapy Management  Current Dose: 17.6 Gy     Planned Dose:  55 + Gy  Narrative . . . . . . . . The patient presents for routine under treatment assessment.                                   The patient is without complaint. She is more comfortable after using the sitz bath on regular basis. Patient has had some diarrhea and will start using Imodium as needed. She does report some fatigue Biafine is helping her itching                                 Set-up films were reviewed.                                 The chart was checked. Physical Findings. . .  height is 5\' 3"  (1.6 m) and weight is 118 lb 4.8 oz (53.661 kg). Her oral temperature is 98.7 F (37.1 C). Her blood pressure is 125/90 and her pulse is 82. . The lungs are clear. The heart has a regular rhythm and rate. The abdomen is soft and nontender with normal bowel sounds. Patient has a strong odor of tobacco. Impression . . . . . . . The patient is tolerating radiation. Plan . . . . . . . . . . . . Continue treatment as planned.  ________________________________   Blair Promise, PhD, MD

## 2014-01-08 ENCOUNTER — Ambulatory Visit
Admission: RE | Admit: 2014-01-08 | Discharge: 2014-01-08 | Disposition: A | Discharge status: Home / Self Care | Payer: Medicare Other | Source: Ambulatory Visit | Attending: Radiation Oncology | Admitting: Radiation Oncology

## 2014-01-08 DIAGNOSIS — C779 Secondary and unspecified malignant neoplasm of lymph node, unspecified: Secondary | ICD-10-CM | POA: Diagnosis not present

## 2014-01-08 DIAGNOSIS — F205 Residual schizophrenia: Secondary | ICD-10-CM | POA: Diagnosis not present

## 2014-01-08 DIAGNOSIS — F17200 Nicotine dependence, unspecified, uncomplicated: Secondary | ICD-10-CM | POA: Diagnosis not present

## 2014-01-08 DIAGNOSIS — C50919 Malignant neoplasm of unspecified site of unspecified female breast: Secondary | ICD-10-CM | POA: Diagnosis not present

## 2014-01-08 DIAGNOSIS — C519 Malignant neoplasm of vulva, unspecified: Secondary | ICD-10-CM | POA: Diagnosis not present

## 2014-01-08 DIAGNOSIS — C792 Secondary malignant neoplasm of skin: Secondary | ICD-10-CM | POA: Diagnosis not present

## 2014-01-08 DIAGNOSIS — Z510 Encounter for antineoplastic radiation therapy: Secondary | ICD-10-CM | POA: Diagnosis not present

## 2014-01-09 ENCOUNTER — Ambulatory Visit
Admission: RE | Admit: 2014-01-09 | Discharge: 2014-01-09 | Disposition: A | Discharge status: Home / Self Care | Payer: Medicare Other | Source: Ambulatory Visit | Attending: Radiation Oncology | Admitting: Radiation Oncology

## 2014-01-09 DIAGNOSIS — F205 Residual schizophrenia: Secondary | ICD-10-CM | POA: Diagnosis not present

## 2014-01-09 DIAGNOSIS — F17200 Nicotine dependence, unspecified, uncomplicated: Secondary | ICD-10-CM | POA: Diagnosis not present

## 2014-01-09 DIAGNOSIS — C50919 Malignant neoplasm of unspecified site of unspecified female breast: Secondary | ICD-10-CM | POA: Diagnosis not present

## 2014-01-09 DIAGNOSIS — C519 Malignant neoplasm of vulva, unspecified: Secondary | ICD-10-CM | POA: Diagnosis not present

## 2014-01-09 DIAGNOSIS — Z510 Encounter for antineoplastic radiation therapy: Secondary | ICD-10-CM | POA: Diagnosis not present

## 2014-01-09 DIAGNOSIS — C792 Secondary malignant neoplasm of skin: Secondary | ICD-10-CM | POA: Diagnosis not present

## 2014-01-10 ENCOUNTER — Ambulatory Visit: Payer: Medicare Other

## 2014-01-13 ENCOUNTER — Ambulatory Visit
Admission: RE | Admit: 2014-01-13 | Discharge: 2014-01-13 | Disposition: A | Discharge status: Home / Self Care | Payer: Medicare Other | Source: Ambulatory Visit | Attending: Radiation Oncology | Admitting: Radiation Oncology

## 2014-01-13 DIAGNOSIS — C792 Secondary malignant neoplasm of skin: Secondary | ICD-10-CM | POA: Diagnosis not present

## 2014-01-13 DIAGNOSIS — F205 Residual schizophrenia: Secondary | ICD-10-CM | POA: Diagnosis not present

## 2014-01-13 DIAGNOSIS — C50919 Malignant neoplasm of unspecified site of unspecified female breast: Secondary | ICD-10-CM | POA: Diagnosis not present

## 2014-01-13 DIAGNOSIS — Z510 Encounter for antineoplastic radiation therapy: Secondary | ICD-10-CM | POA: Diagnosis not present

## 2014-01-13 DIAGNOSIS — F17200 Nicotine dependence, unspecified, uncomplicated: Secondary | ICD-10-CM | POA: Diagnosis not present

## 2014-01-13 DIAGNOSIS — C519 Malignant neoplasm of vulva, unspecified: Secondary | ICD-10-CM | POA: Diagnosis not present

## 2014-01-14 ENCOUNTER — Telehealth: Payer: Self-pay | Admitting: Dietician

## 2014-01-14 ENCOUNTER — Encounter: Payer: Self-pay | Admitting: Radiation Oncology

## 2014-01-14 ENCOUNTER — Ambulatory Visit
Admission: RE | Admit: 2014-01-14 | Discharge: 2014-01-14 | Disposition: A | Discharge status: Home / Self Care | Payer: Medicare Other | Source: Ambulatory Visit | Attending: Radiation Oncology | Admitting: Radiation Oncology

## 2014-01-14 VITALS — BP 94/60 | HR 87 | Temp 98.4°F | Ht 63.0 in | Wt 118.8 lb

## 2014-01-14 DIAGNOSIS — Z510 Encounter for antineoplastic radiation therapy: Secondary | ICD-10-CM | POA: Diagnosis not present

## 2014-01-14 DIAGNOSIS — C519 Malignant neoplasm of vulva, unspecified: Secondary | ICD-10-CM | POA: Diagnosis not present

## 2014-01-14 DIAGNOSIS — F17200 Nicotine dependence, unspecified, uncomplicated: Secondary | ICD-10-CM | POA: Diagnosis not present

## 2014-01-14 DIAGNOSIS — C792 Secondary malignant neoplasm of skin: Secondary | ICD-10-CM | POA: Diagnosis not present

## 2014-01-14 DIAGNOSIS — F205 Residual schizophrenia: Secondary | ICD-10-CM | POA: Diagnosis not present

## 2014-01-14 DIAGNOSIS — C50919 Malignant neoplasm of unspecified site of unspecified female breast: Secondary | ICD-10-CM | POA: Diagnosis not present

## 2014-01-14 NOTE — Progress Notes (Signed)
  Radiation Oncology         [phone] ________________________________  Name: Briana Wiley MRN: [id number]  Date: 01/14/2014  DOB: 10-Nov-1970  Weekly Radiation Therapy Management  DIAGNOSIS: Clinical stage II Vulvar squamous cell carcinoma with PET scan showing inguinal and pelvic metastasis   Current Dose: 26.4 Gy     Planned Dose:  55 Gy  Narrative . . . . . . . . The patient presents for routine under treatment assessment.                                   The patient is without complaint for some problems with diarrhea. Patient will make some dietary changes concerning this issue. She also complains of soreness in the perineum                                 Set-up films were reviewed.                                 The chart was checked. Physical Findings. . .  height is 5\' 3"  (1.6 m) and weight is 118 lb 12.8 oz (53.887 kg). Her temperature is 98.4 F (36.9 C). Her blood pressure is 94/60 and her pulse is 87. Marland Kitchen On pelvic examination the large right labial mass has decreased in size. Patient does have some external hemorrhoids which are nonthrombosed. Impression . . . . . . . The patient is tolerating radiation. Plan . . . . . . . . . . . . Continue treatment as planned.  She will continue sitz baths and Silvadene applications. She will force fluids in light of her low blood pressure.  ________________________________   Blair Promise, PhD, MD

## 2014-01-14 NOTE — Telephone Encounter (Signed)
Brief Outpatient Oncology Nutrition Note  Patient has been identified to be at risk on malnutrition screen.  Wt Readings from Last 10 Encounters:  01/14/14 118 lb 12.8 oz (53.887 kg)  01/07/14 118 lb 4.8 oz (53.661 kg)  01/01/14 115 lb (52.164 kg)  12/17/13 117 lb 1.6 oz (53.116 kg)  12/11/13 119 lb 14.4 oz (54.386 kg)  12/02/13 119 lb 6.4 oz (54.159 kg)  11/20/13 122 lb (55.339 kg)  11/07/13 121 lb (54.885 kg)      Called patient due to weight loss.  Chart reviewed.  Patient with clinical stage II vulvar squamous cell carcinoma with PET scan showing inguinal and pelvic metastasis.    Noted per chart that patient has been having problems with increased diarrhea.  Has been eating increased fried foods and coffee.  Patient was not available.  Message left with the manager of the group home where she lives.  Antonieta Iba, RD, LDN

## 2014-01-14 NOTE — Progress Notes (Addendum)
Ms. Parrillo has received 12 fractions to her pelvis.  She reports watery diarrhea at least once daily.  Discovered she is drinking at least 4 cups of coffee daily and eating fried foods.  Instructed her with friend present to eat a more bland diet such as grits, cream of wheat, white bread, baked meat(not fried) and lactaid milk instead of powdered milk,  Given information on low residue diet.  Hypotension noted ad vitals taken sitting and standing.     She reports that sh has sores and itching in the vaginal region with spotting(blood) on her panties.

## 2014-01-15 ENCOUNTER — Ambulatory Visit
Admission: RE | Admit: 2014-01-15 | Discharge: 2014-01-15 | Disposition: A | Discharge status: Home / Self Care | Payer: Medicare Other | Source: Ambulatory Visit | Attending: Radiation Oncology | Admitting: Radiation Oncology

## 2014-01-15 DIAGNOSIS — C792 Secondary malignant neoplasm of skin: Secondary | ICD-10-CM | POA: Diagnosis not present

## 2014-01-15 DIAGNOSIS — Z510 Encounter for antineoplastic radiation therapy: Secondary | ICD-10-CM | POA: Diagnosis not present

## 2014-01-15 DIAGNOSIS — C50919 Malignant neoplasm of unspecified site of unspecified female breast: Secondary | ICD-10-CM | POA: Diagnosis not present

## 2014-01-15 DIAGNOSIS — F205 Residual schizophrenia: Secondary | ICD-10-CM | POA: Diagnosis not present

## 2014-01-15 DIAGNOSIS — C519 Malignant neoplasm of vulva, unspecified: Secondary | ICD-10-CM | POA: Diagnosis not present

## 2014-01-15 DIAGNOSIS — F17200 Nicotine dependence, unspecified, uncomplicated: Secondary | ICD-10-CM | POA: Diagnosis not present

## 2014-01-16 ENCOUNTER — Ambulatory Visit
Admission: RE | Admit: 2014-01-16 | Discharge: 2014-01-16 | Disposition: A | Discharge status: Home / Self Care | Payer: Medicare Other | Source: Ambulatory Visit | Attending: Radiation Oncology | Admitting: Radiation Oncology

## 2014-01-16 DIAGNOSIS — Z510 Encounter for antineoplastic radiation therapy: Secondary | ICD-10-CM | POA: Diagnosis not present

## 2014-01-16 DIAGNOSIS — C50919 Malignant neoplasm of unspecified site of unspecified female breast: Secondary | ICD-10-CM | POA: Diagnosis not present

## 2014-01-16 DIAGNOSIS — F205 Residual schizophrenia: Secondary | ICD-10-CM | POA: Diagnosis not present

## 2014-01-16 DIAGNOSIS — F17200 Nicotine dependence, unspecified, uncomplicated: Secondary | ICD-10-CM | POA: Diagnosis not present

## 2014-01-16 DIAGNOSIS — C519 Malignant neoplasm of vulva, unspecified: Secondary | ICD-10-CM | POA: Diagnosis not present

## 2014-01-16 DIAGNOSIS — C792 Secondary malignant neoplasm of skin: Secondary | ICD-10-CM | POA: Diagnosis not present

## 2014-01-17 ENCOUNTER — Ambulatory Visit
Admission: RE | Admit: 2014-01-17 | Discharge: 2014-01-17 | Disposition: A | Discharge status: Home / Self Care | Payer: Medicare Other | Source: Ambulatory Visit | Attending: Radiation Oncology | Admitting: Radiation Oncology

## 2014-01-17 DIAGNOSIS — F205 Residual schizophrenia: Secondary | ICD-10-CM | POA: Diagnosis not present

## 2014-01-17 DIAGNOSIS — F17200 Nicotine dependence, unspecified, uncomplicated: Secondary | ICD-10-CM | POA: Diagnosis not present

## 2014-01-17 DIAGNOSIS — C792 Secondary malignant neoplasm of skin: Secondary | ICD-10-CM | POA: Diagnosis not present

## 2014-01-17 DIAGNOSIS — Z510 Encounter for antineoplastic radiation therapy: Secondary | ICD-10-CM | POA: Diagnosis not present

## 2014-01-17 DIAGNOSIS — C50919 Malignant neoplasm of unspecified site of unspecified female breast: Secondary | ICD-10-CM | POA: Diagnosis not present

## 2014-01-17 DIAGNOSIS — C519 Malignant neoplasm of vulva, unspecified: Secondary | ICD-10-CM | POA: Diagnosis not present

## 2014-01-20 ENCOUNTER — Ambulatory Visit
Admission: RE | Admit: 2014-01-20 | Discharge: 2014-01-20 | Disposition: A | Discharge status: Home / Self Care | Payer: Medicare Other | Source: Ambulatory Visit | Attending: Radiation Oncology | Admitting: Radiation Oncology

## 2014-01-20 DIAGNOSIS — Z510 Encounter for antineoplastic radiation therapy: Secondary | ICD-10-CM | POA: Diagnosis not present

## 2014-01-20 DIAGNOSIS — C792 Secondary malignant neoplasm of skin: Secondary | ICD-10-CM | POA: Diagnosis not present

## 2014-01-20 DIAGNOSIS — F205 Residual schizophrenia: Secondary | ICD-10-CM | POA: Diagnosis not present

## 2014-01-20 DIAGNOSIS — F17200 Nicotine dependence, unspecified, uncomplicated: Secondary | ICD-10-CM | POA: Diagnosis not present

## 2014-01-20 DIAGNOSIS — C50919 Malignant neoplasm of unspecified site of unspecified female breast: Secondary | ICD-10-CM | POA: Diagnosis not present

## 2014-01-20 DIAGNOSIS — C519 Malignant neoplasm of vulva, unspecified: Secondary | ICD-10-CM | POA: Diagnosis not present

## 2014-01-21 ENCOUNTER — Ambulatory Visit
Admission: RE | Admit: 2014-01-21 | Discharge: 2014-01-21 | Disposition: A | Discharge status: Home / Self Care | Payer: Medicare Other | Source: Ambulatory Visit | Attending: Radiation Oncology | Admitting: Radiation Oncology

## 2014-01-21 ENCOUNTER — Encounter: Payer: Self-pay | Admitting: Radiation Oncology

## 2014-01-21 VITALS — BP 101/69 | HR 76 | Temp 98.2°F | Ht 63.0 in | Wt 121.0 lb

## 2014-01-21 DIAGNOSIS — F205 Residual schizophrenia: Secondary | ICD-10-CM | POA: Diagnosis not present

## 2014-01-21 DIAGNOSIS — F17200 Nicotine dependence, unspecified, uncomplicated: Secondary | ICD-10-CM | POA: Diagnosis not present

## 2014-01-21 DIAGNOSIS — C50919 Malignant neoplasm of unspecified site of unspecified female breast: Secondary | ICD-10-CM | POA: Diagnosis not present

## 2014-01-21 DIAGNOSIS — R300 Dysuria: Secondary | ICD-10-CM | POA: Insufficient documentation

## 2014-01-21 DIAGNOSIS — C519 Malignant neoplasm of vulva, unspecified: Secondary | ICD-10-CM

## 2014-01-21 DIAGNOSIS — Z510 Encounter for antineoplastic radiation therapy: Secondary | ICD-10-CM | POA: Diagnosis not present

## 2014-01-21 DIAGNOSIS — C779 Secondary and unspecified malignant neoplasm of lymph node, unspecified: Secondary | ICD-10-CM | POA: Diagnosis not present

## 2014-01-21 DIAGNOSIS — C792 Secondary malignant neoplasm of skin: Secondary | ICD-10-CM | POA: Diagnosis not present

## 2014-01-21 LAB — CBC WITH DIFFERENTIAL/PLATELET
BASO%: 0.2 % (ref 0.0–2.0)
Basophils Absolute: 0 10*3/uL (ref 0.0–0.1)
EOS ABS: 0.1 10*3/uL (ref 0.0–0.5)
EOS%: 1.5 % (ref 0.0–7.0)
HCT: 43.5 % (ref 34.8–46.6)
HGB: 14.2 g/dL (ref 11.6–15.9)
LYMPH%: 5.4 % — ABNORMAL LOW (ref 14.0–49.7)
MCH: 29.3 pg (ref 25.1–34.0)
MCHC: 32.7 g/dL (ref 31.5–36.0)
MCV: 89.6 fL (ref 79.5–101.0)
MONO#: 0.4 10*3/uL (ref 0.1–0.9)
MONO%: 6.6 % (ref 0.0–14.0)
NEUT%: 86.3 % — ABNORMAL HIGH (ref 38.4–76.8)
NEUTROS ABS: 5.6 10*3/uL (ref 1.5–6.5)
PLATELETS: 154 10*3/uL (ref 145–400)
RBC: 4.85 10*6/uL (ref 3.70–5.45)
RDW: 14.7 % — ABNORMAL HIGH (ref 11.2–14.5)
WBC: 6.5 10*3/uL (ref 3.9–10.3)
lymph#: 0.4 10*3/uL — ABNORMAL LOW (ref 0.9–3.3)

## 2014-01-21 LAB — URINALYSIS, MICROSCOPIC - CHCC
BILIRUBIN (URINE): NEGATIVE
GLUCOSE UR CHCC: NEGATIVE mg/dL
KETONES: NEGATIVE mg/dL
Nitrite: NEGATIVE
Protein: NEGATIVE mg/dL
Specific Gravity, Urine: 1.005 (ref 1.003–1.035)
Urobilinogen, UR: 0.2 mg/dL (ref 0.2–1)
pH: 6.5 (ref 4.6–8.0)

## 2014-01-21 LAB — BASIC METABOLIC PANEL (CC13)
ANION GAP: 6 meq/L (ref 3–11)
BUN: 10.1 mg/dL (ref 7.0–26.0)
CALCIUM: 9.7 mg/dL (ref 8.4–10.4)
CO2: 29 meq/L (ref 22–29)
CREATININE: 0.8 mg/dL (ref 0.6–1.1)
Chloride: 108 mEq/L (ref 98–109)
Glucose: 100 mg/dl (ref 70–140)
Potassium: 4.4 mEq/L (ref 3.5–5.1)
SODIUM: 142 meq/L (ref 136–145)

## 2014-01-21 NOTE — Progress Notes (Signed)
Patient reports that she continues to have blisters in her vaginal area.  She reports they burn even when water touches them.  She reports they burn when urinating too.  She is not able to tolerate the sitz bath.  She is still having itching in the vaginal area.  Uses biafine as needed.  She reports a good appetite.  She denies nausea and is taking compazine before treatment.  She reports fatigue.

## 2014-01-21 NOTE — Progress Notes (Signed)
  Radiation Oncology         [phone] ________________________________  Name: ABISAI COBLE MRN: [id number]  Date: 01/21/2014  DOB: Feb 21, 1971  Weekly Radiation Therapy Management  DIAGNOSIS: Clinical stage II Vulvar squamous cell carcinoma with PET scan showing inguinal and pelvic metastasis   Current Dose: 37.4 Gy     Planned Dose:  55 + Gy  Narrative . . . . . . . . The patient presents for routine under treatment assessment.                                   The patient is having significant itching and soreness in the treatment area. She continues to apply Silvadene to the treatment area. sHe denies any nausea. Her appetite is good. She is taking Zofran prior to her radiation treatment to avoid nausea. She denies any significant bleeding from the treatment area.  She has had some mild diarrhea and takes Imodium as needed.  She is having some dysuria and will go to the lab to rule out infection                                 Set-up films were reviewed.                                 The chart was checked. Physical Findings. . the lungs are clear. The heart has regular rhythm and rate. The abdomen is soft and nontender with normal bowel sounds. Impression . . . . . . . The patient is tolerating radiation. Plan . . . . . . . . . . . . Continue treatment as planned. The patient will be set to the lab for urinalysis culture and sensitivity as well as routine blood work.  ________________________________   Blair Promise, PhD, MD

## 2014-01-21 NOTE — Addendum Note (Signed)
Encounter addended by: Jacqulyn Liner, RN on: 01/21/2014 12:37 PM<BR>     Documentation filed: Chief Complaint Section, Inpatient Document Flowsheet, Notes Section, Vitals Section

## 2014-01-22 ENCOUNTER — Ambulatory Visit
Admission: RE | Admit: 2014-01-22 | Discharge: 2014-01-22 | Disposition: A | Discharge status: Home / Self Care | Payer: Medicare Other | Source: Ambulatory Visit | Attending: Radiation Oncology | Admitting: Radiation Oncology

## 2014-01-22 DIAGNOSIS — C792 Secondary malignant neoplasm of skin: Secondary | ICD-10-CM | POA: Diagnosis not present

## 2014-01-22 DIAGNOSIS — C50919 Malignant neoplasm of unspecified site of unspecified female breast: Secondary | ICD-10-CM | POA: Diagnosis not present

## 2014-01-22 DIAGNOSIS — F205 Residual schizophrenia: Secondary | ICD-10-CM | POA: Diagnosis not present

## 2014-01-22 DIAGNOSIS — Z510 Encounter for antineoplastic radiation therapy: Secondary | ICD-10-CM | POA: Diagnosis not present

## 2014-01-22 DIAGNOSIS — C519 Malignant neoplasm of vulva, unspecified: Secondary | ICD-10-CM | POA: Diagnosis not present

## 2014-01-22 DIAGNOSIS — F17200 Nicotine dependence, unspecified, uncomplicated: Secondary | ICD-10-CM | POA: Diagnosis not present

## 2014-01-23 ENCOUNTER — Ambulatory Visit
Admission: RE | Admit: 2014-01-23 | Discharge: 2014-01-23 | Disposition: A | Discharge status: Home / Self Care | Payer: Medicare Other | Source: Ambulatory Visit | Attending: Radiation Oncology | Admitting: Radiation Oncology

## 2014-01-23 DIAGNOSIS — Z510 Encounter for antineoplastic radiation therapy: Secondary | ICD-10-CM | POA: Diagnosis not present

## 2014-01-23 DIAGNOSIS — C519 Malignant neoplasm of vulva, unspecified: Secondary | ICD-10-CM | POA: Diagnosis not present

## 2014-01-23 DIAGNOSIS — R300 Dysuria: Secondary | ICD-10-CM | POA: Insufficient documentation

## 2014-01-23 LAB — URINE CULTURE

## 2014-01-24 ENCOUNTER — Ambulatory Visit
Admission: RE | Admit: 2014-01-24 | Discharge: 2014-01-24 | Disposition: A | Discharge status: Home / Self Care | Payer: Medicare Other | Source: Ambulatory Visit | Attending: Radiation Oncology | Admitting: Radiation Oncology

## 2014-01-24 DIAGNOSIS — C519 Malignant neoplasm of vulva, unspecified: Secondary | ICD-10-CM | POA: Diagnosis not present

## 2014-01-24 DIAGNOSIS — Z510 Encounter for antineoplastic radiation therapy: Secondary | ICD-10-CM | POA: Diagnosis not present

## 2014-01-27 ENCOUNTER — Ambulatory Visit
Admission: RE | Admit: 2014-01-27 | Discharge: 2014-01-27 | Disposition: A | Discharge status: Home / Self Care | Payer: Medicare Other | Source: Ambulatory Visit | Attending: Radiation Oncology | Admitting: Radiation Oncology

## 2014-01-27 DIAGNOSIS — C519 Malignant neoplasm of vulva, unspecified: Secondary | ICD-10-CM | POA: Diagnosis not present

## 2014-01-27 DIAGNOSIS — Z510 Encounter for antineoplastic radiation therapy: Secondary | ICD-10-CM | POA: Diagnosis not present

## 2014-01-28 ENCOUNTER — Encounter: Payer: Self-pay | Admitting: Radiation Oncology

## 2014-01-28 ENCOUNTER — Ambulatory Visit
Admission: RE | Admit: 2014-01-28 | Discharge: 2014-01-28 | Disposition: A | Discharge status: Home / Self Care | Payer: Medicare Other | Source: Ambulatory Visit | Attending: Radiation Oncology | Admitting: Radiation Oncology

## 2014-01-28 VITALS — BP 102/62 | HR 87 | Temp 98.0°F | Resp 16 | Ht 63.0 in | Wt 117.4 lb

## 2014-01-28 DIAGNOSIS — Z510 Encounter for antineoplastic radiation therapy: Secondary | ICD-10-CM | POA: Diagnosis not present

## 2014-01-28 DIAGNOSIS — C519 Malignant neoplasm of vulva, unspecified: Secondary | ICD-10-CM

## 2014-01-28 NOTE — Progress Notes (Signed)
Briana Wiley has completed 22 fractions to her pelvis.  She denies pain.  She reports having urinary frequency.  She denies any burning with urination.  She reports the blisters on her vaginal area are healed.  She reports having vaginal spotting.  She is using her sitz bath and is using biafine cream.  She denies having any diarrhea.  She reports a good appetite.  She has lost 1 lb.  She denies nausea.  She reports fatigue.

## 2014-01-28 NOTE — Progress Notes (Signed)
  Radiation Oncology         [phone] ________________________________  Name: Briana Wiley MRN: [id number]  Date: 01/28/2014  DOB: [birth year]  Weekly Radiation Therapy Management  DIAGNOSIS: Clinical stage II Vulvar squamous cell carcinoma with PET scan showing inguinal and pelvic metastasis  Current Dose: 48.4 Gy     Planned Dose:  59.4 Gy  Narrative . . . . . . . . The patient presents for routine under treatment assessment.                                   The patient is without complaint. She seems to be doing a little better this week. She continues to have a discomfort in the perineum but patient reports her blisters in the vulvovaginal area has healed. she continues to use sitz bath and Biafine cream                                 Set-up films were reviewed.                                 The chart was checked. Physical Findings. . .  height is 5\' 3"  (1.6 m) and weight is 117 lb 6.4 oz (53.252 kg). Her oral temperature is 98 F (36.7 C). Her blood pressure is 102/62 and her pulse is 87. Her respiration is 16. . The lungs are clear. The heart has a regular rhythm and rate. The abdomen is soft and nontender with normal bowel sounds. Impression . . . . . . . The patient is tolerating radiation. Plan . . . . . . . . . . . . Continue treatment as planned.  ________________________________   Blair Promise, PhD, MD

## 2014-01-29 ENCOUNTER — Ambulatory Visit
Admission: RE | Admit: 2014-01-29 | Discharge: 2014-01-29 | Disposition: A | Discharge status: Home / Self Care | Payer: Medicare Other | Source: Ambulatory Visit | Attending: Radiation Oncology | Admitting: Radiation Oncology

## 2014-01-29 DIAGNOSIS — C519 Malignant neoplasm of vulva, unspecified: Secondary | ICD-10-CM

## 2014-01-29 DIAGNOSIS — Z510 Encounter for antineoplastic radiation therapy: Secondary | ICD-10-CM | POA: Diagnosis not present

## 2014-01-29 MED ORDER — BIAFINE EX EMUL
Freq: Once | CUTANEOUS | Status: AC
  Administered 2014-01-29: 12:00:00 via TOPICAL

## 2014-01-30 ENCOUNTER — Ambulatory Visit
Admission: RE | Admit: 2014-01-30 | Discharge: 2014-01-30 | Disposition: A | Discharge status: Home / Self Care | Payer: Medicare Other | Source: Ambulatory Visit | Attending: Radiation Oncology | Admitting: Radiation Oncology

## 2014-01-30 ENCOUNTER — Other Ambulatory Visit: Payer: Self-pay | Admitting: Radiation Oncology

## 2014-01-30 DIAGNOSIS — C519 Malignant neoplasm of vulva, unspecified: Secondary | ICD-10-CM | POA: Diagnosis not present

## 2014-01-30 DIAGNOSIS — Z510 Encounter for antineoplastic radiation therapy: Secondary | ICD-10-CM | POA: Diagnosis not present

## 2014-01-30 MED ORDER — PROCHLORPERAZINE MALEATE 10 MG PO TABS: 10.0000 mg | ORAL_TABLET | Freq: Four times a day (QID) | ORAL | Status: DC | PRN

## 2014-01-31 ENCOUNTER — Ambulatory Visit
Admission: RE | Admit: 2014-01-31 | Discharge: 2014-01-31 | Disposition: A | Discharge status: Home / Self Care | Payer: Medicare Other | Source: Ambulatory Visit | Attending: Radiation Oncology | Admitting: Radiation Oncology

## 2014-01-31 DIAGNOSIS — Z510 Encounter for antineoplastic radiation therapy: Secondary | ICD-10-CM | POA: Diagnosis not present

## 2014-01-31 DIAGNOSIS — C519 Malignant neoplasm of vulva, unspecified: Secondary | ICD-10-CM | POA: Diagnosis not present

## 2014-02-03 ENCOUNTER — Ambulatory Visit
Admission: RE | Admit: 2014-02-03 | Discharge: 2014-02-03 | Disposition: A | Discharge status: Home / Self Care | Payer: Medicare Other | Source: Ambulatory Visit | Attending: Radiation Oncology | Admitting: Radiation Oncology

## 2014-02-03 DIAGNOSIS — Z510 Encounter for antineoplastic radiation therapy: Secondary | ICD-10-CM | POA: Diagnosis not present

## 2014-02-03 DIAGNOSIS — C519 Malignant neoplasm of vulva, unspecified: Secondary | ICD-10-CM | POA: Diagnosis not present

## 2014-02-04 ENCOUNTER — Ambulatory Visit
Admission: RE | Admit: 2014-02-04 | Discharge: 2014-02-04 | Disposition: A | Discharge status: Home / Self Care | Payer: Medicare Other | Source: Ambulatory Visit | Attending: Radiation Oncology | Admitting: Radiation Oncology

## 2014-02-04 ENCOUNTER — Encounter: Payer: Self-pay | Admitting: Radiation Oncology

## 2014-02-04 VITALS — BP 116/75 | HR 81 | Temp 98.2°F | Resp 12 | Ht 63.0 in | Wt 121.4 lb

## 2014-02-04 DIAGNOSIS — C519 Malignant neoplasm of vulva, unspecified: Secondary | ICD-10-CM

## 2014-02-04 DIAGNOSIS — N9089 Other specified noninflammatory disorders of vulva and perineum: Secondary | ICD-10-CM

## 2014-02-04 DIAGNOSIS — Z510 Encounter for antineoplastic radiation therapy: Secondary | ICD-10-CM | POA: Diagnosis not present

## 2014-02-04 MED ORDER — SILVER SULFADIAZINE 1 % EX CREA: TOPICAL_CREAM | Freq: Two times a day (BID) | CUTANEOUS | Status: DC

## 2014-02-04 NOTE — Progress Notes (Signed)
  Radiation Oncology         [phone] ________________________________  Name: Briana Wiley MRN: [id number]  Date: 02/04/2014  DOB: [birth year]  Weekly Radiation Therapy Management  DIAGNOSIS: Clinical stage II Vulvar squamous cell carcinoma with PET scan showing inguinal and pelvic metastasis   Current Dose: 48.6 Gy     Planned Dose:  59.4 Gy  Narrative . . . . . . . . The patient presents for routine under treatment assessment.                                   The patient continues to have significant discomfort in the perineum. She continues to use sitz bath and biafine                                 Set-up films were reviewed.                                 The chart was checked. Physical Findings. . .  height is 5\' 3"  (1.6 m) and weight is 121 lb 6.4 oz (55.067 kg). Her oral temperature is 98.2 F (36.8 C). Her blood pressure is 116/75 and her pulse is 81. Her respiration is 12. . On examination the patient has had a good response to her radiation therapy with significant shrinkage of the vulvar lesion. She does have moist desquamation in the right inguinal area  Impression . . . . . . . The patient is tolerating radiation. Plan . . . . . . . . . . . . Continue treatment as planned. The patient will be given Silvadene for her skin reaction  ________________________________   Blair Promise, PhD, MD

## 2014-02-04 NOTE — Progress Notes (Signed)
Briana Wiley has completed 27 fractions to her pelvis.  She reports pain/soreness in her vaginal and groin area.  She is taking 1 norco at night.  She denies any vaginal bleeding.  e reports the skin on the sides of her legs/vaginal area is peeling and red.  It also itches.  She denies bladder issues.  She reports having runny bowel movements 2-3 times a day.  She is taking Imodium 3 times a day as needed.  She reports fatigue.

## 2014-02-05 ENCOUNTER — Ambulatory Visit: Payer: Medicare Other

## 2014-02-05 ENCOUNTER — Ambulatory Visit
Admission: RE | Admit: 2014-02-05 | Discharge: 2014-02-05 | Disposition: A | Discharge status: Home / Self Care | Payer: Medicare Other | Source: Ambulatory Visit | Attending: Radiation Oncology | Admitting: Radiation Oncology

## 2014-02-05 DIAGNOSIS — C519 Malignant neoplasm of vulva, unspecified: Secondary | ICD-10-CM | POA: Diagnosis not present

## 2014-02-05 DIAGNOSIS — Z510 Encounter for antineoplastic radiation therapy: Secondary | ICD-10-CM | POA: Diagnosis not present

## 2014-02-05 MED ORDER — SILVER SULFADIAZINE 1 % EX CREA
TOPICAL_CREAM | Freq: Two times a day (BID) | CUTANEOUS | Status: DC
  Administered 2014-02-05: 12:00:00 via TOPICAL

## 2014-02-06 ENCOUNTER — Ambulatory Visit: Payer: Medicare Other

## 2014-02-06 ENCOUNTER — Ambulatory Visit
Admission: RE | Admit: 2014-02-06 | Discharge: 2014-02-06 | Disposition: A | Discharge status: Home / Self Care | Payer: Medicare Other | Source: Ambulatory Visit | Attending: Radiation Oncology | Admitting: Radiation Oncology

## 2014-02-06 DIAGNOSIS — C519 Malignant neoplasm of vulva, unspecified: Secondary | ICD-10-CM | POA: Diagnosis not present

## 2014-02-06 DIAGNOSIS — Z510 Encounter for antineoplastic radiation therapy: Secondary | ICD-10-CM | POA: Diagnosis not present

## 2014-02-07 ENCOUNTER — Ambulatory Visit
Admission: RE | Admit: 2014-02-07 | Discharge: 2014-02-07 | Disposition: A | Discharge status: Home / Self Care | Payer: Medicare Other | Source: Ambulatory Visit | Attending: Radiation Oncology | Admitting: Radiation Oncology

## 2014-02-07 DIAGNOSIS — C519 Malignant neoplasm of vulva, unspecified: Secondary | ICD-10-CM | POA: Diagnosis not present

## 2014-02-07 DIAGNOSIS — Z510 Encounter for antineoplastic radiation therapy: Secondary | ICD-10-CM | POA: Diagnosis not present

## 2014-02-10 ENCOUNTER — Ambulatory Visit
Admission: RE | Admit: 2014-02-10 | Discharge: 2014-02-10 | Disposition: A | Discharge status: Home / Self Care | Payer: Medicare Other | Source: Ambulatory Visit | Attending: Radiation Oncology | Admitting: Radiation Oncology

## 2014-02-10 DIAGNOSIS — C519 Malignant neoplasm of vulva, unspecified: Secondary | ICD-10-CM | POA: Diagnosis not present

## 2014-02-10 DIAGNOSIS — Z510 Encounter for antineoplastic radiation therapy: Secondary | ICD-10-CM | POA: Diagnosis not present

## 2014-02-11 ENCOUNTER — Ambulatory Visit: Payer: Medicare Other | Admitting: Radiation Oncology

## 2014-02-11 ENCOUNTER — Ambulatory Visit: Payer: Medicare Other

## 2014-02-12 ENCOUNTER — Ambulatory Visit: Admission: RE | Admit: 2014-02-12 | Payer: Medicare Other | Source: Ambulatory Visit | Admitting: Radiation Oncology

## 2014-02-12 ENCOUNTER — Ambulatory Visit
Admission: RE | Admit: 2014-02-12 | Discharge: 2014-02-12 | Disposition: A | Discharge status: Home / Self Care | Payer: Medicare Other | Source: Ambulatory Visit | Attending: Radiation Oncology | Admitting: Radiation Oncology

## 2014-02-12 ENCOUNTER — Ambulatory Visit: Payer: Medicare Other

## 2014-02-12 DIAGNOSIS — Z510 Encounter for antineoplastic radiation therapy: Secondary | ICD-10-CM | POA: Diagnosis not present

## 2014-02-12 DIAGNOSIS — C519 Malignant neoplasm of vulva, unspecified: Secondary | ICD-10-CM | POA: Diagnosis not present

## 2014-02-12 MED ORDER — HYDROCODONE-ACETAMINOPHEN 5-325 MG PO TABS: 1.0000 | ORAL_TABLET | ORAL | Status: DC | PRN

## 2014-02-13 ENCOUNTER — Encounter: Payer: Self-pay | Admitting: Radiation Oncology

## 2014-02-13 ENCOUNTER — Inpatient Hospital Stay
Admission: RE | Admit: 2014-02-13 | Discharge: 2014-02-13 | Disposition: A | Discharge status: Home / Self Care | Payer: Medicare Other | Source: Ambulatory Visit | Attending: Radiation Oncology | Admitting: Radiation Oncology

## 2014-02-13 ENCOUNTER — Ambulatory Visit: Payer: Medicare Other

## 2014-02-13 ENCOUNTER — Ambulatory Visit
Admission: RE | Admit: 2014-02-13 | Discharge: 2014-02-13 | Disposition: A | Discharge status: Home / Self Care | Payer: Medicare Other | Source: Ambulatory Visit | Attending: Radiation Oncology | Admitting: Radiation Oncology

## 2014-02-13 VITALS — BP 116/72 | HR 81 | Temp 98.4°F | Resp 20 | Ht 63.0 in | Wt 123.8 lb

## 2014-02-13 DIAGNOSIS — C519 Malignant neoplasm of vulva, unspecified: Secondary | ICD-10-CM | POA: Diagnosis not present

## 2014-02-13 DIAGNOSIS — Z510 Encounter for antineoplastic radiation therapy: Secondary | ICD-10-CM | POA: Diagnosis not present

## 2014-02-13 MED ORDER — SILVER SULFADIAZINE 1 % EX CREA
TOPICAL_CREAM | Freq: Two times a day (BID) | CUTANEOUS | Status: DC
  Administered 2014-02-13: 12:00:00 via TOPICAL

## 2014-02-13 NOTE — Progress Notes (Signed)
Briana Wiley has completed treatment to her pelvis.  She is reporting pain at a 4/10 in her vaginal area.  She reports she has blisters that occasionally bleed with white pus.  She is using silvadene cream twice a day.  She reports burning occasional with urination due to her skin irritation.  She denies nausea and diarrhea.  She reports fatigue.  She is requesting a refill of hydrocodone/acetaminphen.  She reports she was taking 1 a day.  Follow up appointment card given.

## 2014-02-13 NOTE — Progress Notes (Signed)
  Radiation Oncology         ([phone] ________________________________  Name: Briana Wiley MRN: [id number]  Date: 02/13/2014  DOB: March 28, 1971  Weekly Radiation Therapy Management  DIAGNOSIS: Clinical stage II Vulvar squamous cell carcinoma with PET scan showing inguinal and pelvic metastasis   Current Dose: 59.4 Gy     Planned Dose:  59.4 Gy  Narrative . . . . . . . . The patient presents for routine under treatment assessment.                                   The patient is happy to complete her radiation therapy. She does have a lot of soreness however in the perineum. She denies any urination difficulties or vaginal bleeding. She continues to do sitz baths and Silvadene. Pain medicine refill today.                                 Set-up films were reviewed.                                 The chart was checked. Physical Findings. . .  height is 5\' 3"  (1.6 m) and weight is 123 lb 12.8 oz (56.155 kg). Her oral temperature is 98.4 F (36.9 C). Her blood pressure is 116/72 and her pulse is 81. Her respiration is 20. Marland Kitchen Confluent moist desquamation throughout the perineum. No obvious signs of infection. Impression . . . . . . . The patient is tolerating radiation. Plan . . . . . . . . . . . . continue Silvadene and sitz bath.  short interval followup in 2 weeks.  The patient and staff know to call if she develops a fever.  ________________________________   Blair Promise, PhD, MD

## 2014-02-23 ENCOUNTER — Encounter: Payer: Self-pay | Admitting: Radiation Oncology

## 2014-02-23 NOTE — Progress Notes (Signed)
  Radiation Oncology         [phone]) [phone] ________________________________  Name: Briana Wiley MRN: [id number]  Date: 02/23/2014  DOB: Jun 03, 1970  End of Treatment Note  Diagnosis: Clinical stage II Vulvar squamous cell carcinoma with PET scan showing inguinal and pelvic metastasis      Indication for treatment:  Definitive treatment       Radiation treatment dates:   September 3 through October 22  Site/dose:   Initial treatment was 45 gray to the pelvis. The PET positive inguinal and pelvic nodes were boosted to 55 gray with a simultaneous integrated boost.   After completion of this treatment the patient proceeded with a boost to the vulvar area to a cumulative dose of 59.4 gray to this area.  Beams/energy:   Helical intensity modulated radiation therapy, 6 megavoltage photons for initial field setup and for boost field  Narrative: The patient tolerated radiation treatment relatively well.   She had good tumor regression. She did develop moist desquamation in the perineum as expected. This was treated with sitz bath and Silvadene.   Plan: The patient has completed radiation treatment. The patient will return to radiation oncology clinic for routine followup in 2 weeks. I advised them to call or return sooner if they have any questions or concerns related to their recovery or treatment.  -----------------------------------  Blair Promise, PhD, MD

## 2014-02-24 ENCOUNTER — Encounter: Payer: Self-pay | Admitting: Radiation Oncology

## 2014-02-26 ENCOUNTER — Encounter: Payer: Self-pay | Admitting: Oncology

## 2014-02-27 ENCOUNTER — Telehealth: Payer: Self-pay | Admitting: Oncology

## 2014-02-27 ENCOUNTER — Encounter: Payer: Self-pay | Admitting: Radiation Oncology

## 2014-02-27 ENCOUNTER — Ambulatory Visit
Admission: RE | Admit: 2014-02-27 | Discharge: 2014-02-27 | Disposition: A | Discharge status: Home / Self Care | Payer: Medicare Other | Source: Ambulatory Visit | Attending: Radiation Oncology | Admitting: Radiation Oncology

## 2014-02-27 VITALS — BP 119/74 | HR 72 | Temp 98.6°F | Resp 16 | Ht 63.0 in | Wt 118.3 lb

## 2014-02-27 DIAGNOSIS — C519 Malignant neoplasm of vulva, unspecified: Secondary | ICD-10-CM

## 2014-02-27 MED ORDER — HYDROCODONE-ACETAMINOPHEN 5-325 MG PO TABS: 1.0000 | ORAL_TABLET | ORAL | Status: DC | PRN

## 2014-02-27 NOTE — Telephone Encounter (Signed)
[facility] regarding her pain medication script being lost.  Per Anne Ng, her driver, they think they put the script down when waiting for the car.  They are going back to see if it was picked up.  Advised her to call back and let me know if they found it.

## 2014-02-27 NOTE — Progress Notes (Signed)
Radiation Oncology         [phone] ________________________________  Name: Briana Wiley MRN: [id number]  Date: 02/27/2014  DOB: [birth year]  Follow-Up Visit Note  CC: [name], MD  [name], MD    ICD-9-CM ICD-10-CM   1. Vulvar cancer 184.4 C51.9     Diagnosis: Clinical stage II Vulvar squamous cell carcinoma with PET scan showing inguinal and pelvic metastasis      Interval Since Last Radiation:  2  weeks  Narrative:  The patient returns today for routine follow-up.  She continues to have pain in the [place]. But did run out of her pain medication approximately 4 days ago. She denies any bleeding from the area. She does have some dysuria but no strong urine odor or hematuria. The patient's appetite has been down. She denies any chills or fever                              ALLERGIES:  has No Known Allergies.  Meds: Current Outpatient Prescriptions  Medication Sig Dispense Refill  . benztropine (COGENTIN) 1 MG tablet Take 1 mg by mouth 2 (two) times daily.    Marland Kitchen emollient (BIAFINE) cream Apply topically as needed.    . haloperidol (HALDOL) 5 MG tablet Take 5 mg by mouth at bedtime.    . haloperidol decanoate (HALDOL DECANOATE) 100 MG/ML injection Inject into the muscle every 28 (twenty-eight) days.    Marland Kitchen HYDROcodone-acetaminophen (NORCO/VICODIN) 5-325 MG per tablet Take 1 tablet by mouth every 4 (four) hours as needed for moderate pain. 30 tablet 0  . ibuprofen (ADVIL,MOTRIN) 400 MG tablet Take 400 mg by mouth every 6 (six) hours as needed.    . silver sulfADIAZINE (SILVADENE) 1 % cream Apply 1 application topically 2 (two) times daily. Apply to red/open areas twice a day.  Clean site with clean, wet washcloth before each application.    . solifenacin (VESICARE) 10 MG tablet Take by mouth daily.    . traZODone (DESYREL) 50 MG tablet Take 50 mg by mouth at bedtime. Takes 3 tabs at bedtime    . loperamide (IMODIUM A-D) 2 MG tablet Take 2 mg by mouth 4 (four) times  daily as needed for diarrhea or loose stools. Do not take more than 8 tablets in 24 hours.    . prochlorperazine (COMPAZINE) 10 MG tablet Take 1 tablet (10 mg total) by mouth every 6 (six) hours as needed for nausea or vomiting. 30 tablet 0   No current facility-administered medications for this encounter.    Physical Findings: The patient is in no acute distress. Patient is alert and oriented.  height is 5\' 3"  (1.6 m) and weight is 118 lb 4.8 oz (53.661 kg). Her oral temperature is 98.6 F (37 C). Her blood pressure is 119/74 and her pulse is 72. Her respiration is 16. . The lungs are clear. The heart has a regular rhythm and rate. Examination of the vulva area reveals significant healing over the past 2 weeks. Patient has minimal moist desquamation at this time.  Lab Findings: Lab Results  Component Value Date   WBC 6.5 01/21/2014   HGB 14.2 01/21/2014   HCT 43.5 01/21/2014   MCV 89.6 01/21/2014   PLT 154 01/21/2014    Radiographic Findings: No results found.  Impression:  The patient is recovering from the effects of radiation. No signs of infection in the perineum.  Plan:  Routine followup in one  month.  ____________________________________ Blair Promise, MD

## 2014-02-27 NOTE — Progress Notes (Signed)
Briana Wiley here for follow up after treatment for vulvar cancer.  She reports pain at a 4/10 in her vaginal area.  She is out of pain medication and is requesting a refill.  She denies nausea and reports having diarrhea once per day.  She is not taking Imodium now.  She reports burning with urination.  She reports frequent urination.  She denies any vaginal/rectal bleeding.  She reports her vaginal area is swollen and itches.  She is using biafine cream and silvadene twice a day.  She reports fatigue.  She also reports a poor appetite and has lost 5 lbs since 02/13/14.

## 2014-03-06 DIAGNOSIS — F209 Schizophrenia, unspecified: Secondary | ICD-10-CM | POA: Diagnosis not present

## 2014-03-26 ENCOUNTER — Ambulatory Visit: Payer: Medicare Other | Admitting: Radiation Oncology

## 2014-03-28 ENCOUNTER — Encounter: Payer: Self-pay | Admitting: Obstetrics & Gynecology

## 2014-03-28 NOTE — Progress Notes (Signed)
Patient ID: Briana Wiley, female   DOB: Jan 08, 1971, 43 y.o.   MRN: [id number] Pt presented today to Derrek Monaco, NP for evaluation of vaginal itching and soreness for a couple of weeks. Anderson Malta noted a right vulvar lesion which was enlarged and consistent with possible vulvar canrcinoma i concurred and talked with the pt and recommended to do a vulvar biopsy  Area was prepped Local anesthetic was injected 4 mm punch biopsy used and tissue returned and sent to pathology for evaluation  My concern is that this represents an invasive vulvar cancer Will see next week to go over biopsy report

## 2014-04-03 ENCOUNTER — Ambulatory Visit
Admission: RE | Admit: 2014-04-03 | Discharge: 2014-04-03 | Disposition: A | Discharge status: Home / Self Care | Payer: Medicare Other | Source: Ambulatory Visit | Attending: Radiation Oncology | Admitting: Radiation Oncology

## 2014-04-03 ENCOUNTER — Encounter: Payer: Self-pay | Admitting: Radiation Oncology

## 2014-04-03 VITALS — BP 109/79 | HR 73 | Temp 98.1°F | Resp 16 | Ht 63.0 in | Wt 124.1 lb

## 2014-04-03 DIAGNOSIS — C519 Malignant neoplasm of vulva, unspecified: Secondary | ICD-10-CM

## 2014-04-03 MED ORDER — PROCHLORPERAZINE MALEATE 10 MG PO TABS: 10.0000 mg | ORAL_TABLET | Freq: Four times a day (QID) | ORAL | Status: DC | PRN

## 2014-04-03 NOTE — Progress Notes (Signed)
Briana Wiley here for follow up after treatment for vulvar cancer.  She reports occasional pain in her vaginal area.  She has not had to take any pain medication for this.  She reports having headaches frequently.  She denies any blurred vision and weakness in her arms and legs.  She denies any bladder issues.  She reports nausea.  She continues to take compazine and needs a refill.  She denies having diarrhea.  She denies any vaginal/rectal bleeding.  She denies fatigue.  She reports the skin in her vaginal area is healing.  She is using biafine and silvadene twice a day.

## 2014-04-03 NOTE — Progress Notes (Signed)
Briana Wiley was given a size S+ and M vaginal dilators and surgilube.  She was instructed to start with the S+ dilator and to apply surgilube to it and insert for 10 minutes every other day (Monday, Wednesday and Friday) and then to wash the dilator with soap and water after use.  [name] verbalized understanding in teach back mode.

## 2014-04-03 NOTE — Progress Notes (Signed)
Radiation Oncology         [phone] ________________________________  Name: Briana Wiley MRN: [id number]  Date: 04/03/2014  DOB: May 21, 1970  Follow-Up Visit Note  CC: [name], MD  [name], MD    ICD-9-CM ICD-10-CM   1. 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  She is making good improvement. She continues to have some soreness in the vulvovaginal area but no bleeding or significant drainage. She denies any hematuria or rectal bleeding.  She occasionally will have some nausea and I've refilled her nausea medication.                              ALLERGIES:  has No Known Allergies.  Meds: Current Outpatient Prescriptions  Medication Sig Dispense Refill  . benztropine (COGENTIN) 1 MG tablet Take 1 mg by mouth 2 (two) times daily.    Marland Kitchen emollient (BIAFINE) cream Apply topically as needed.    . haloperidol (HALDOL) 5 MG tablet Take 5 mg by mouth at bedtime.    . haloperidol decanoate (HALDOL DECANOATE) 100 MG/ML injection Inject into the muscle every 28 (twenty-eight) days.    Marland Kitchen ibuprofen (ADVIL,MOTRIN) 400 MG tablet Take 400 mg by mouth every 6 (six) hours as needed.    . loperamide (IMODIUM A-D) 2 MG tablet Take 2 mg by mouth 4 (four) times daily as needed for diarrhea or loose stools. Do not take more than 8 tablets in 24 hours.    . prochlorperazine (COMPAZINE) 10 MG tablet Take 1 tablet (10 mg total) by mouth every 6 (six) hours as needed for nausea or vomiting. 30 tablet 0  . silver sulfADIAZINE (SILVADENE) 1 % cream Apply 1 application topically 2 (two) times daily. Apply to red/open areas twice a day.  Clean site with clean, wet washcloth before each application.    . solifenacin (VESICARE) 10 MG tablet Take by mouth daily.    . traZODone (DESYREL) 50  MG tablet Take 50 mg by mouth at bedtime. Takes 3 tabs at bedtime    . HYDROcodone-acetaminophen (NORCO/VICODIN) 5-325 MG per tablet Take 1 tablet by mouth every 4 (four) hours as needed for moderate pain. (Patient not taking: Reported on 04/03/2014) 30 tablet 0   No current facility-administered medications for this encounter.    Physical Findings: The patient is in no acute distress. Patient is alert and oriented.  height is 5\' 3"  (1.6 m) and weight is 124 lb 1.6 oz (56.291 kg). Her oral temperature is 98.1 F (36.7 C). Her blood pressure is 109/79 and her pulse is 73. Her respiration is 16. Marland Kitchen  No palpable supraclavicular or axillary adenopathy. The lungs are clear to auscultation. The heart has a regular rhythm and rate. The abdomen is soft and nontender with normal bowel sounds. On pelvic examination the patient tissues have healed at this time. Patient has had good response to her treatment with no obvious residual disease at this time. A digital vaginal exam shows no lower vaginal lesions. Patient does have several non-thrombosed small external hemorrhoids  Lab Findings: Lab Results  Component Value Date   WBC 6.5 01/21/2014   HGB 14.2 01/21/2014   HCT 43.5 01/21/2014   MCV 89.6 01/21/2014  PLT 154 01/21/2014    Radiographic Findings: No results found.  Impression:  The patient is recovering from the effects of radiation.  Favorable response to radiation therapy thus far  Plan:  followup with gynecologic oncology in February. Routine followup in radiation oncology in May of 2016. Today the patient was given a vaginal dilator and instructions on its use. Patient will likely need repeat imaging next spring to assess her pelvic nodal status.  ____________________________________ Blair Promise, MD

## 2014-04-03 NOTE — Addendum Note (Signed)
Encounter addended by: Gery Pray, MD on: 04/03/2014 12:28 PM<BR>     Documentation filed: Orders

## 2014-06-19 DIAGNOSIS — F209 Schizophrenia, unspecified: Secondary | ICD-10-CM | POA: Diagnosis not present

## 2014-07-15 ENCOUNTER — Other Ambulatory Visit (HOSPITAL_COMMUNITY): Payer: Self-pay | Admitting: Internal Medicine

## 2014-07-15 DIAGNOSIS — Z1231 Encounter for screening mammogram for malignant neoplasm of breast: Secondary | ICD-10-CM

## 2014-07-17 ENCOUNTER — Ambulatory Visit (HOSPITAL_COMMUNITY)
Admission: RE | Admit: 2014-07-17 | Discharge: 2014-07-17 | Disposition: A | Discharge status: Home / Self Care | Payer: Medicare Other | Source: Ambulatory Visit | Attending: Internal Medicine | Admitting: Internal Medicine

## 2014-07-17 DIAGNOSIS — Z1231 Encounter for screening mammogram for malignant neoplasm of breast: Secondary | ICD-10-CM | POA: Insufficient documentation

## 2014-09-25 ENCOUNTER — Telehealth: Payer: Self-pay | Admitting: *Deleted

## 2014-09-25 ENCOUNTER — Encounter: Payer: Self-pay | Admitting: Radiation Oncology

## 2014-09-25 ENCOUNTER — Ambulatory Visit
Admission: RE | Admit: 2014-09-25 | Discharge: 2014-09-25 | Disposition: A | Discharge status: Home / Self Care | Payer: Medicare Other | Source: Ambulatory Visit | Attending: Radiation Oncology | Admitting: Radiation Oncology

## 2014-09-25 VITALS — BP 139/71 | HR 80 | Temp 98.1°F | Resp 20 | Ht 63.0 in | Wt 139.0 lb

## 2014-09-25 DIAGNOSIS — Z923 Personal history of irradiation: Secondary | ICD-10-CM | POA: Diagnosis not present

## 2014-09-25 DIAGNOSIS — Z8544 Personal history of malignant neoplasm of other female genital organs: Secondary | ICD-10-CM | POA: Diagnosis not present

## 2014-09-25 DIAGNOSIS — C519 Malignant neoplasm of vulva, unspecified: Secondary | ICD-10-CM

## 2014-09-25 DIAGNOSIS — Z08 Encounter for follow-up examination after completed treatment for malignant neoplasm: Secondary | ICD-10-CM | POA: Diagnosis not present

## 2014-09-25 LAB — CBC WITH DIFFERENTIAL/PLATELET
BASO%: 0.1 % (ref 0.0–2.0)
BASOS ABS: 0 10*3/uL (ref 0.0–0.1)
EOS ABS: 0 10*3/uL (ref 0.0–0.5)
EOS%: 0.2 % (ref 0.0–7.0)
HCT: 45.6 % (ref 34.8–46.6)
HEMOGLOBIN: 16.1 g/dL — AB (ref 11.6–15.9)
LYMPH%: 4.9 % — ABNORMAL LOW (ref 14.0–49.7)
MCH: 32.6 pg (ref 25.1–34.0)
MCHC: 35.3 g/dL (ref 31.5–36.0)
MCV: 92.3 fL (ref 79.5–101.0)
MONO#: 0.6 10*3/uL (ref 0.1–0.9)
MONO%: 5 % (ref 0.0–14.0)
NEUT#: 9.9 10*3/uL — ABNORMAL HIGH (ref 1.5–6.5)
NEUT%: 89.8 % — ABNORMAL HIGH (ref 38.4–76.8)
Platelets: 148 10*3/uL (ref 145–400)
RBC: 4.94 10*6/uL (ref 3.70–5.45)
RDW: 15.2 % — AB (ref 11.2–14.5)
WBC: 11.1 10*3/uL — ABNORMAL HIGH (ref 3.9–10.3)
lymph#: 0.5 10*3/uL — ABNORMAL LOW (ref 0.9–3.3)

## 2014-09-25 LAB — BASIC METABOLIC PANEL (CC13)
Anion Gap: 9 mEq/L (ref 3–11)
BUN: 7.5 mg/dL (ref 7.0–26.0)
CALCIUM: 9.4 mg/dL (ref 8.4–10.4)
CHLORIDE: 108 meq/L (ref 98–109)
CO2: 27 mEq/L (ref 22–29)
CREATININE: 0.8 mg/dL (ref 0.6–1.1)
GLUCOSE: 102 mg/dL (ref 70–140)
Potassium: 4.2 mEq/L (ref 3.5–5.1)
Sodium: 143 mEq/L (ref 136–145)

## 2014-09-25 NOTE — Telephone Encounter (Signed)
CALLED PATIENT AND LEFT MESSAGE FOR A RETURN CALL

## 2014-09-25 NOTE — Progress Notes (Signed)
Follow up s/p radiation vulva 12/26/13-02/14/15, patient denies any pain, nausea, does state yellow discharge  Every now and then  From vagina,  No bleeding, no dysuria,  appetite good, energy level poor stated"I have a cold started last night',  11:24 AM BP 139/71 mmHg  Pulse 80  Temp(Src) 98.1 F (36.7 C) (Oral)  Resp 20  Ht 5\' 3"  (1.6 m)  Wt 139 lb (63.05 kg)  BMI 24.63 kg/m2  Wt Readings from Last 3 Encounters:  12/02/13 119 lb 6.4 oz (54.159 kg)  11/20/13 122 lb (55.339 kg)  11/07/13 121 lb (54.885 kg)

## 2014-09-25 NOTE — Progress Notes (Signed)
Radiation Oncology         [phone]) [phone] ________________________________  Name: Briana Wiley MRN: [id number]  Date: 09/25/2014  DOB: [birth year]  Follow-Up Visit Note  CC: [name], MD  [name], MD    ICD-9-CM ICD-10-CM   1. Vulvar cancer 184.4 C51.9 CBC with Differential     Basic metabolic panel    Diagnosis:    Clinical stage II Vulvar squamous cell carcinoma with PET scan showing inguinal and pelvic metastasis   Interval Since Last Radiation:  7  months  Narrative:  Briana Wiley is a 44 year old female presenting to clinic for routine follow-up appointment. Patient reports swelling in the vagina area. Denies pain or vaginal bleeding, but claims occasional "every now and then" discolored (yellow) discharge. No report of nausea, dysuria, or change in appetite good. "I have a cold started last night," stated Briana Wiley. She reports decline in energy, poor energy level. No explanation provided to missed appointment, three months ago.      ALLERGIES:  has No Known Allergies.  Meds: Current Outpatient Prescriptions  Medication Sig Dispense Refill  . benztropine (COGENTIN) 1 MG tablet Take 1 mg by mouth 2 (two) times daily.    . haloperidol (HALDOL) 5 MG tablet Take 5 mg by mouth at bedtime.    . haloperidol decanoate (HALDOL DECANOATE) 100 MG/ML injection Inject into the muscle every 28 (twenty-eight) days.    Marland Kitchen ibuprofen (ADVIL,MOTRIN) 400 MG tablet Take 400 mg by mouth every 6 (six) hours as needed.    . loperamide (IMODIUM A-D) 2 MG tablet Take 2 mg by mouth 4 (four) times daily as needed for diarrhea or loose stools. Do not take more than 8 tablets in 24 hours.    . prochlorperazine (COMPAZINE) 10 MG tablet Take 1 tablet (10 mg total) by mouth every 6 (six) hours as needed for nausea or vomiting. 30 tablet 0  . solifenacin (VESICARE) 10 MG tablet Take by mouth daily.    . traZODone (DESYREL) 50 MG tablet Take 50 mg by mouth at bedtime. Takes 3 tabs at bedtime      . emollient (BIAFINE) cream Apply topically as needed.    Marland Kitchen HYDROcodone-acetaminophen (NORCO/VICODIN) 5-325 MG per tablet Take 1 tablet by mouth every 4 (four) hours as needed for moderate pain. (Patient not taking: Reported on 04/03/2014) 30 tablet 0  . silver sulfADIAZINE (SILVADENE) 1 % cream Apply 1 application topically 2 (two) times daily. Apply to red/open areas twice a day.  Clean site with clean, wet washcloth before each application.     No current facility-administered medications for this encounter.    Physical Findings:  The patient is in no acute distress. Patient is alert and oriented.  height is 5\' 3"  (1.6 m) and weight is 139 lb (63.05 kg). Her oral temperature is 98.1 F (36.7 C). Her blood pressure is 139/71 and her pulse is 80. Her respiration is 20. Marland Kitchen  No palpable supraclavicular or axillary adenopathy. The lungs are clear to auscultation. The heart has a regular rhythm and rate. The abdomen is soft and nontender with normal bowel sounds. On pelvic examination the patient tissues have healed at this time. Patient has had good response to her treatment with no obvious residual disease at this time. A digital vaginal exam shows no  vaginal lesions. Patient does have several non-thrombosed small external hemorrhoids on rectal exam. No pelvic masses appreciated on bimanual and rectovaginal examination. Rectal exam is somewhat uncomfortable for the patient in light  of her hemorrhoids. No palpable inguinal adenopathy.    Lab Findings: Lab Results  Component Value Date   WBC 6.5 01/21/2014   HGB 14.2 01/21/2014   HCT 43.5 01/21/2014   MCV 89.6 01/21/2014   PLT 154 01/21/2014    Radiographic Findings: No results found.  Impression:  The patient is recovering from the effects of radiation. Favorable response to radiation therapy thus far. No signs of persistent disease.  Plan:  CT scan and additional lab work scheduled. Advised of 6 month follow up appointment with  radiation oncology. Advised of three month follow up appointment with gynecologic oncology.    This document serves as a record of services personally performed by Gery Pray, MD. It was created on his behalf by Lenn Cal, a trained medical scribe. The creation of this record is based on the scribe's personal observations and the provider's statements to them. This document has been checked and approved by the attending provider.     ____________________________________  -----------------------------------  Blair Promise, PhD, MD

## 2014-09-30 ENCOUNTER — Ambulatory Visit (HOSPITAL_COMMUNITY)
Admission: RE | Admit: 2014-09-30 | Discharge: 2014-09-30 | Disposition: A | Discharge status: Home / Self Care | Payer: Medicare Other | Source: Ambulatory Visit | Attending: Radiation Oncology | Admitting: Radiation Oncology

## 2014-09-30 DIAGNOSIS — K7689 Other specified diseases of liver: Secondary | ICD-10-CM | POA: Diagnosis not present

## 2014-09-30 DIAGNOSIS — C519 Malignant neoplasm of vulva, unspecified: Secondary | ICD-10-CM | POA: Insufficient documentation

## 2014-09-30 DIAGNOSIS — R911 Solitary pulmonary nodule: Secondary | ICD-10-CM | POA: Diagnosis not present

## 2014-09-30 LAB — GLUCOSE, CAPILLARY: GLUCOSE-CAPILLARY: 94 mg/dL (ref 65–99)

## 2014-09-30 MED ORDER — FLUDEOXYGLUCOSE F - 18 (FDG) INJECTION
6.8000 | Freq: Once | INTRAVENOUS | Status: AC | PRN
  Administered 2014-09-30: 6.8 via INTRAVENOUS

## 2014-10-02 DIAGNOSIS — F209 Schizophrenia, unspecified: Secondary | ICD-10-CM | POA: Diagnosis not present

## 2014-12-25 DIAGNOSIS — F209 Schizophrenia, unspecified: Secondary | ICD-10-CM | POA: Diagnosis not present

## 2014-12-25 DIAGNOSIS — R32 Unspecified urinary incontinence: Secondary | ICD-10-CM | POA: Diagnosis not present

## 2014-12-25 DIAGNOSIS — Z23 Encounter for immunization: Secondary | ICD-10-CM | POA: Diagnosis not present

## 2014-12-25 DIAGNOSIS — J309 Allergic rhinitis, unspecified: Secondary | ICD-10-CM | POA: Diagnosis not present

## 2014-12-25 DIAGNOSIS — E785 Hyperlipidemia, unspecified: Secondary | ICD-10-CM | POA: Diagnosis not present

## 2014-12-25 DIAGNOSIS — F17200 Nicotine dependence, unspecified, uncomplicated: Secondary | ICD-10-CM | POA: Diagnosis not present

## 2015-01-02 ENCOUNTER — Telehealth: Payer: Self-pay | Admitting: Nurse Practitioner

## 2015-01-02 ENCOUNTER — Ambulatory Visit: Payer: Medicare Other | Attending: Gynecologic Oncology | Admitting: Gynecologic Oncology

## 2015-01-02 NOTE — Telephone Encounter (Signed)
RN attempted to reach patient to reschedule missed apt. No answer, no message. Will attempt to reach patient at later date.

## 2015-01-07 ENCOUNTER — Telehealth: Payer: Self-pay | Admitting: Nurse Practitioner

## 2015-01-07 NOTE — Telephone Encounter (Signed)
Rn attempted to reach patient and emergency contact to reschedule missed apt. No answer, no message left. Will continue to attempt to reach patient.

## 2015-01-15 DIAGNOSIS — F209 Schizophrenia, unspecified: Secondary | ICD-10-CM | POA: Diagnosis not present

## 2015-01-21 DIAGNOSIS — F209 Schizophrenia, unspecified: Secondary | ICD-10-CM | POA: Diagnosis not present

## 2015-01-21 DIAGNOSIS — F17200 Nicotine dependence, unspecified, uncomplicated: Secondary | ICD-10-CM | POA: Diagnosis not present

## 2015-01-21 DIAGNOSIS — R32 Unspecified urinary incontinence: Secondary | ICD-10-CM | POA: Diagnosis not present

## 2015-01-21 DIAGNOSIS — J40 Bronchitis, not specified as acute or chronic: Secondary | ICD-10-CM | POA: Diagnosis not present

## 2015-03-26 ENCOUNTER — Ambulatory Visit
Admission: RE | Admit: 2015-03-26 | Discharge: 2015-03-26 | Disposition: A | Discharge status: Home / Self Care | Payer: Medicare Other | Source: Ambulatory Visit | Attending: Radiation Oncology | Admitting: Radiation Oncology

## 2015-03-26 ENCOUNTER — Encounter: Payer: Self-pay | Admitting: Radiation Oncology

## 2015-03-26 VITALS — BP 122/55 | HR 74 | Temp 97.9°F | Resp 18 | Ht 63.0 in | Wt 145.8 lb

## 2015-03-26 DIAGNOSIS — C519 Malignant neoplasm of vulva, unspecified: Secondary | ICD-10-CM | POA: Diagnosis not present

## 2015-03-26 NOTE — Progress Notes (Signed)
Gave patient a size XS vaginal dilator and surgilube.

## 2015-03-26 NOTE — Progress Notes (Signed)
Radiation Oncology         [phone]) [phone] ________________________________  Name: Briana Wiley MRN: [id number]  Date: 03/26/2015  DOB: [birth year]  Follow-Up Visit Note  CC: FANTA,TESFAYE, MD  [name], MD    ICD-9-CM ICD-10-CM   1. Vulvar cancer ([place]) 184.4 C51.9     Diagnosis:    Clinical Stage II Vulvar squamous cell carcinoma with PET scan showing inguinal and pelvic metastasis   Interval Since Last Radiation:  1 year and 1 month. September 3 through February 13, 2014  Site/dose:   Initial treatment was 45 gray to the pelvis. The PET positive inguinal and pelvic nodes were boosted to 55 gray with a simultaneous integrated boost.   After completion of this treatment the patient proceeded with a boost to the vulvar area to a cumulative dose of 59.4 gray to this area.  Narrative:  Briana Wiley is a 44 year old female presenting to clinic for routine follow-up appointment. She reports sharp pain in her vaginal area that "feels like something needs to come out" that she is rating at a 5/10. She is using Ibuprofen for the pain. She reports she stopped using her vaginal dilator because it was causing her to bleed after using it. She has not used it in a week. She reports having urinary frequency. She denies having any bladder issues. She reports having yellow vaginal discharge. She reports having a poor appetite. She does have a good energy level. She is using silvadene and biafine cream. She denies having any skin irritation. She also reports facial swelling.  ALLERGIES:  has No Known Allergies.  Meds: Current Outpatient Prescriptions  Medication Sig Dispense Refill  . benztropine (COGENTIN) 1 MG tablet Take 1 mg by mouth 2 (two) times daily.    Marland Kitchen emollient (BIAFINE) cream Apply topically as needed.    . haloperidol (HALDOL) 5 MG tablet Take 5 mg by mouth at bedtime.    . haloperidol decanoate (HALDOL DECANOATE) 100 MG/ML injection Inject into the muscle every 28  (twenty-eight) days.    Marland Kitchen ibuprofen (ADVIL,MOTRIN) 400 MG tablet Take 400 mg by mouth every 6 (six) hours as needed.    . silver sulfADIAZINE (SILVADENE) 1 % cream Apply 1 application topically 2 (two) times daily. Apply to red/open areas twice a day.  Clean site with clean, wet washcloth before each application.    . solifenacin (VESICARE) 10 MG tablet Take by mouth daily.    . traZODone (DESYREL) 50 MG tablet Take 50 mg by mouth at bedtime. Takes 3 tabs at bedtime    . HYDROcodone-acetaminophen (NORCO/VICODIN) 5-325 MG per tablet Take 1 tablet by mouth every 4 (four) hours as needed for moderate pain. (Patient not taking: Reported on 03/26/2015) 30 tablet 0  . loperamide (IMODIUM A-D) 2 MG tablet Take 2 mg by mouth 4 (four) times daily as needed for diarrhea or loose stools. Do not take more than 8 tablets in 24 hours.    . prochlorperazine (COMPAZINE) 10 MG tablet Take 1 tablet (10 mg total) by mouth every 6 (six) hours as needed for nausea or vomiting. (Patient not taking: Reported on 03/26/2015) 30 tablet 0   No current facility-administered medications for this encounter.    Physical Findings:  The patient is in no acute distress. Patient is alert and oriented.  height is 5\' 3"  (1.6 m) and weight is 145 lb 12.8 oz (66.134 kg). Her oral temperature is 97.9 F (36.6 C). Her blood pressure is 122/55 and her  pulse is 74. Her respiration is 18 and oxygen saturation is 99%.   No palpable supraclavicular or axillary adenopathy. The lungs are clear to auscultation. The heart has a regular rhythm and rate. The abdomen is soft and nontender with normal bowel sounds.  Patient has had good response to her treatment with no obvious residual disease at this time. A digital vaginal exam shows no vaginal lesions. Patient does have several non-thrombosed small external hemorrhoids on rectal exam. No pelvic masses appreciated on bimanual and rectovaginal examination. Rectal exam is somewhat uncomfortable for the  patient in light of her hemorrhoids. No palpable inguinal adenopathy.  Lab Findings: Lab Results  Component Value Date   WBC 11.1* 09/25/2014   HGB 16.1* 09/25/2014   HCT 45.6 09/25/2014   MCV 92.3 09/25/2014   PLT 148 09/25/2014    Radiographic Findings: No results found.  Impression:   No evidence of recurrence on clinical exam today. PET scan in June of this year showed no residual activity  Plan:   Advised of three month follow up appointment with gynecologic oncology.  Routine follow-up in radiation oncology in 6 months ____________________________________   Blair Promise, PhD, MD  This document serves as a record of services personally performed by Gery Pray, MD. It was created on his behalf by Darcus Austin, a trained medical scribe. The creation of this record is based on the scribe's personal observations and the provider's statements to them. This document has been checked and approved by the attending provider.

## 2015-03-26 NOTE — Progress Notes (Addendum)
Briana Wiley here for follow up.  She reports sharp pain in her vaginal area that "feels like something needs to come out" that she is rating at a 5/10.  She reports she stopped using her vaginal dilator because it was causing her to bleed after using it.  She has not used it in a week.  She reports having urinary frequency.  She denies having any bladder issues.  She reports having yellow vaginal discharge.  She reports having a poor appetite.  She does have a good energy level.  She is using silvadene and biafine cream.  She denies having any skin irritation.    BP 122/55 mmHg  Pulse 74  Temp(Src) 97.9 F (36.6 C) (Oral)  Resp 18  Ht 5\' 3"  (1.6 m)  Wt 145 lb 12.8 oz (66.134 kg)  BMI 25.83 kg/m2  SpO2 99%   Wt Readings from Last 3 Encounters:  03/26/15 145 lb 12.8 oz (66.134 kg)  09/25/14 139 lb (63.05 kg)  04/03/14 124 lb 1.6 oz (56.291 kg)

## 2015-04-01 ENCOUNTER — Emergency Department (HOSPITAL_COMMUNITY)
Admission: EM | Admit: 2015-04-01 | Discharge: 2015-04-01 | Disposition: A | Discharge status: Home / Self Care | Payer: Medicare Other | Attending: Emergency Medicine | Admitting: Emergency Medicine

## 2015-04-01 ENCOUNTER — Encounter (HOSPITAL_COMMUNITY): Payer: Self-pay

## 2015-04-01 ENCOUNTER — Emergency Department (HOSPITAL_COMMUNITY): Payer: Medicare Other

## 2015-04-01 DIAGNOSIS — R0789 Other chest pain: Secondary | ICD-10-CM | POA: Diagnosis not present

## 2015-04-01 DIAGNOSIS — Z923 Personal history of irradiation: Secondary | ICD-10-CM | POA: Insufficient documentation

## 2015-04-01 DIAGNOSIS — Z79899 Other long term (current) drug therapy: Secondary | ICD-10-CM | POA: Insufficient documentation

## 2015-04-01 DIAGNOSIS — Z8742 Personal history of other diseases of the female genital tract: Secondary | ICD-10-CM | POA: Diagnosis not present

## 2015-04-01 DIAGNOSIS — F17210 Nicotine dependence, cigarettes, uncomplicated: Secondary | ICD-10-CM | POA: Insufficient documentation

## 2015-04-01 DIAGNOSIS — Z8669 Personal history of other diseases of the nervous system and sense organs: Secondary | ICD-10-CM | POA: Diagnosis not present

## 2015-04-01 DIAGNOSIS — R05 Cough: Secondary | ICD-10-CM | POA: Insufficient documentation

## 2015-04-01 DIAGNOSIS — J410 Simple chronic bronchitis: Secondary | ICD-10-CM | POA: Diagnosis not present

## 2015-04-01 DIAGNOSIS — R071 Chest pain on breathing: Secondary | ICD-10-CM | POA: Diagnosis not present

## 2015-04-01 DIAGNOSIS — F329 Major depressive disorder, single episode, unspecified: Secondary | ICD-10-CM | POA: Diagnosis not present

## 2015-04-01 DIAGNOSIS — Z792 Long term (current) use of antibiotics: Secondary | ICD-10-CM | POA: Diagnosis not present

## 2015-04-01 DIAGNOSIS — R509 Fever, unspecified: Secondary | ICD-10-CM | POA: Diagnosis not present

## 2015-04-01 DIAGNOSIS — R079 Chest pain, unspecified: Secondary | ICD-10-CM | POA: Diagnosis present

## 2015-04-01 DIAGNOSIS — R059 Cough, unspecified: Secondary | ICD-10-CM

## 2015-04-01 DIAGNOSIS — F17200 Nicotine dependence, unspecified, uncomplicated: Secondary | ICD-10-CM | POA: Diagnosis not present

## 2015-04-01 LAB — CBC WITH DIFFERENTIAL/PLATELET
BASOS PCT: 0 %
Basophils Absolute: 0 10*3/uL (ref 0.0–0.1)
EOS ABS: 0 10*3/uL (ref 0.0–0.7)
Eosinophils Relative: 1 %
HEMATOCRIT: 44 % (ref 36.0–46.0)
Hemoglobin: 15.3 g/dL — ABNORMAL HIGH (ref 12.0–15.0)
Lymphocytes Relative: 16 %
Lymphs Abs: 0.8 10*3/uL (ref 0.7–4.0)
MCH: 32.3 pg (ref 26.0–34.0)
MCHC: 34.8 g/dL (ref 30.0–36.0)
MCV: 93 fL (ref 78.0–100.0)
MONO ABS: 0.4 10*3/uL (ref 0.1–1.0)
MONOS PCT: 9 %
Neutro Abs: 3.5 10*3/uL (ref 1.7–7.7)
Neutrophils Relative %: 74 %
Platelets: 134 10*3/uL — ABNORMAL LOW (ref 150–400)
RBC: 4.73 MIL/uL (ref 3.87–5.11)
RDW: 14.6 % (ref 11.5–15.5)
WBC: 4.8 10*3/uL (ref 4.0–10.5)

## 2015-04-01 LAB — BASIC METABOLIC PANEL
Anion gap: 11 (ref 5–15)
BUN: 9 mg/dL (ref 6–20)
CALCIUM: 9.6 mg/dL (ref 8.9–10.3)
CO2: 26 mmol/L (ref 22–32)
CREATININE: 0.66 mg/dL (ref 0.44–1.00)
Chloride: 106 mmol/L (ref 101–111)
GFR calc non Af Amer: >60 mL/min (ref >60)
Glucose, Bld: 99 mg/dL (ref 65–99)
Potassium: 3.9 mmol/L (ref 3.5–5.1)
Sodium: 143 mmol/L (ref 135–145)

## 2015-04-01 LAB — TROPONIN I: Troponin I: <0.03 ng/mL (ref <0.031)

## 2015-04-01 LAB — D-DIMER, QUANTITATIVE (NOT AT ARMC): D-Dimer, Quant: <0.27 ug/mL-FEU (ref 0.00–0.50)

## 2015-04-01 MED ORDER — NAPROXEN 250 MG PO TABS
250.0000 mg | ORAL_TABLET | Freq: Two times a day (BID) | ORAL | Status: DC | PRN
Start: 2015-04-01 — End: 2015-07-15

## 2015-04-01 MED ORDER — ACETAMINOPHEN 500 MG PO TABS
1000.0000 mg | ORAL_TABLET | Freq: Once | ORAL | Status: AC
  Administered 2015-04-01: 1000 mg via ORAL
  Filled 2015-04-01: qty 2

## 2015-04-01 MED ORDER — ALBUTEROL SULFATE HFA 108 (90 BASE) MCG/ACT IN AERS: 2.0000 | INHALATION_SPRAY | RESPIRATORY_TRACT | Status: AC | PRN

## 2015-04-01 MED ORDER — BENZONATATE 100 MG PO CAPS: 100.0000 mg | ORAL_CAPSULE | Freq: Three times a day (TID) | ORAL | Status: DC | PRN

## 2015-04-01 MED ORDER — IBUPROFEN 400 MG PO TABS
400.0000 mg | ORAL_TABLET | Freq: Once | ORAL | Status: AC
  Administered 2015-04-01: 400 mg via ORAL
  Filled 2015-04-01: qty 1

## 2015-04-01 NOTE — Discharge Instructions (Signed)
°Emergency Department Resource Guide °1) Find a Doctor and Pay Out of Pocket °Although you won't have to find out who is covered by your insurance plan, it is a good idea to ask around and get recommendations. You will then need to call the office and see if the doctor you have chosen will accept you as a new patient and what types of options they offer for patients who are self-pay. Some doctors offer discounts or will set up payment plans for their patients who do not have insurance, but you will need to ask so you aren't surprised when you get to your appointment. ° °2) Contact Your Local Health Department °Not all health departments have doctors that can see patients for sick visits, but many do, so it is worth a call to see if yours does. If you don't know where your local health department is, you can check in your phone book. The CDC also has a tool to help you locate your state's health department, and many state websites also have listings of all of their local health departments. ° °3) Find a Walk-in Clinic °If your illness is not likely to be very severe or complicated, you may want to try a walk in clinic. These are popping up all over the country in pharmacies, drugstores, and shopping centers. They're usually staffed by nurse practitioners or physician assistants that have been trained to treat common illnesses and complaints. They're usually fairly quick and inexpensive. However, if you have serious medical issues or chronic medical problems, these are probably not your best option. ° °No Primary Care Doctor: °- Call [facility] at  832-8000 - they can help you locate a primary care doctor that  accepts your insurance, provides certain services, etc. °- Physician Referral Service- 1-800-533-3463 ° °Chronic Pain Problems: °Organization         Address  Phone   Notes  °[facility]  ([phone]) 297-2271 Patients need to be referred by their primary care doctor.  ° °Medication  Assistance: °Organization         Address  Phone   Notes  °[place] [facility] [address] °[place], [place] [address] (336) 641-8030 --Must be a resident of [place] °-- Must have NO insurance coverage whatsoever (no Medicaid/ Medicare, etc.) °-- The pt. MUST have a primary care doctor that directs their care regularly and follows them in the community °  °[facility]  (866) 331-1348   °[employer]  (888) 892-1162   ° °Agencies that provide inexpensive medical care: °Organization         Address  Phone   Notes  °[facility]  (336) 832-8035   °[facility]    (336) 832-7272   °[facility] [address] °[place], [place] [address] (336) 832-4777   °[facility] [address], °[place] (336) 271-4999   °[facility]    (336) 373-0678   °[facility]    (336) 272-1050   °[facility] ° [address], [place] Phone:  (336) 832-4444, Fax:  (336) 832-4440 Hours of Operation:  9 am - 6 pm, M-F.  Also accepts Medicaid/Medicare and self-pay.  °[facility] ° [address], [place] Phone: (336) 832-3150, Fax: (336) [phone]. Hours of Operation:  8:30 am - 5:30 pm, M-F.  Also accepts Medicaid and self-pay.  °[facility] [place] [address], [place] Phone: (336) 878-6027   °[facility] [address], [place], [place] (336)723-1848, Ext. 123 Mondays & Thursdays: 7-9 AM.  First 15 patients are seen on a first come, first serve basis. °  ° °Medicaid-accepting [place] Providers: ° °Organization         Address  Phone   Notes  °[facility] [address], [place] (336) 641-2100 Also accepts self-pay patients.  °[facility] [address], [place] ° (336) 856-9996   °[facility] [address], [place]  (336) 288-8857   °[facility] [address] [place] [address], [place] (336) 299-7000   °Veita Bland [address], [place]  ° (336) 373-1557 Only accepts [place] Access Medicaid patients after they have their name applied to their card.  ° °Self-Pay (no insurance) in [place]: ° °Organization         Address  Phone   Notes  °Sickle Cell Patients, [facility] [address], [place] (336) 832-1970   °[facility] [address], [place] (336) 832-4400   °[facility] ° [address], [place] (336) 992-4800   °[facility]/Dr. Osei-Bonsu ° [address] [place] [address], [place] or [address], [place] (336) 841-8500 Phone number for both [place] and [place] locations is the same.  °[facility] [address], [place] (336) 299-0000   °[facility] [address] [place] [address], [place] or [address] (336) 852-7530 °(336) 878-2260   °[facility] [address], [place] (336) 350-1642, phone; (336) 294-5005, fax Sees patients 1st and 3rd Saturday of every month.  Must not qualify for public or private insurance (i.e. Medicaid, Medicare, [place] Health Choice, Veterans' Benefits) • Household income should be no more than 200% of the poverty level •The clinic cannot treat you if you are pregnant or think you are pregnant • Sexually transmitted diseases are not treated at the clinic.  ° ° °Dental Care: °Organization         Address  Phone  Notes  °[place] [facility] [address],  (336) 641-6152 Accepts children up to age 21 who are enrolled in Medicaid or [place] Health Choice; pregnant women with a Medicaid card; and children who have applied for Medicaid or [place] Health Choice, but were declined, whose parents can pay a reduced fee at time of service.  °[place]  [facility] [place]  [address], [place] (336) 641-7733 Accepts children up to age 21 who are enrolled in Medicaid or [place] Health Choice; pregnant women with a Medicaid card; and children who have applied for Medicaid or [place] Health Choice, but were declined, whose parents can pay a reduced fee at time of service.  °[facility] ° [address],  (336) 641-4533 Patients are seen by appointment only. Walk-ins are not accepted. [facility] will see patients 18 years of age and older. °Monday - Tuesday (8am-5pm) °Most Wednesdays (8:30-5pm) °$30 per visit, cash only  °[facility] ° [address], [place] (336) 641-4533 Patients are seen by appointment only. Walk-ins are not accepted. [facility] will see patients 18 years of age and older. °One   Wednesday Evening (Monthly: Volunteer Based).  $30 per visit, cash only  °[facility]  (919) 537-3737 for adults; Children under age 4, call Graduate Pediatric Dentistry at ([phone]) [phone]. Children aged 4-14, please call ([phone]) [phone] to request a pediatric application. ° Dental services are provided in all areas of dental care including fillings, crowns and bridges, complete and partial dentures, implants, gum treatment, root canals, and extractions. Preventive care is also provided. Treatment is provided to both adults and children. °Patients are selected via a lottery and there is often a waiting list. °  °[facility] [address], °[place] ° (336) 763-8833 [email or web address].[email or web address] °  °[facility] [address], [place], [place] (336)723-1848, Ext. 123 Second and Fourth Thursday of each month, opens at 6:30 AM; Clinic ends at 9 AM.  Patients are seen on a first-come first-served basis, and a limited number are seen during each clinic.  ° °[facility] ° [address], [place], [place] (336) [phone]    Eligibility Requirements °You must have lived in [place], [place], or [place] counties for at least the last three months. °  You cannot be eligible for state or federal sponsored healthcare insurance, including Veterans Administration, Medicaid, or Medicare. °  You generally cannot be eligible for healthcare insurance through your employer.  °  How to apply: °Eligibility screenings are held every Tuesday and Wednesday afternoon from 1:00 pm until 4:00 pm. You do not need an appointment for the interview!  °[facility] [address], [place], [place] 336-631-2330   °[facility]  336-342-8273   °[facility]  336-703-3100   °[facility]  336-570-6415   ° °Behavioral Health Resources in the Community: °Intensive Outpatient Programs °Organization         Address  Phone  Notes  °[facility] [address], [place], [place] 336-878-6098   °[facility] [address], [place], [place] 336-832-9800   °[facility] [address], [place], [place] ° 336-882-2125   °[facility] [address],  °[place], [place] 1-800-853-5163 or 336-641-4981   °Substance Abuse Resources °Organization         Address  Phone  Notes  °[facility]  336-882-2125   °[facility]  336-784-9470   °[facility]  336-285-9073   °[facility]  336-845-3988   °Residential & Outpatient Substance Abuse Program  1-800-659-3381   °Psychological Services °Organization         Address  Phone  Notes  °[facility]  336- 832-9600   °[facility]  336- 378-7881   °[facility] [address], [place] 1-800-853-5163 or 336-641-4981   ° °Mobile Crisis Teams °Organization         Address  Phone  Notes  °[facility], Mobile Crisis Care Unit  1-877-626-1772   °Assertive °[facility] ° [address]  [place], [place] 336-834-9664   °Sharon DeEsch [address] °[place] 336-554-5454   ° °Self-Help/Support Groups °Organization         Address  Phone             Notes  °[facility]  - variety of support groups  336- 373-1402 Call for more information  °Narcotics Anonymous (NA), [facility] [address], °[place]   2 meetings at this location  ° °  Residential Treatment Programs Organization         Address  Phone  Notes  [facility] [address]    [place]  1-(574)035-6832   [facility]  [address] [place] [id number], [place], [place]   [place], [place] [phone] Admissions: 8am-3pm M-F  [facility] [place] [address]    [place], [place] [phone]   [facility] [address] [place], [place], [place]   [facility] [address]  [place], [place]   [facility] Intensive Outpatient [address], [place], [place]   [facility] ([place]  [place], [place] 1-862-778-9311 or (938)[phone]   [facility] [address] [place], [place] Accepts Medicaid  [facility] [address],  [place] 1-8542000264 Substance Abuse/Addiction Treatment   [facility]         Address  Phone  Notes  [facility]  7194009504   Domenic Schwab, PhD [address] [place], [place]   (343)392-0287 or (404)530-1438   [place], [place] (619)172-2425   [facility] [address] [place], [place] [phone] Insurance/Medicaid/sponsorship through [facility] [address]                                    [place], [place] (639) [phone] [place] [address], [place] [phone]    Dr. Adele Schilder  [phone]   [facility] 1) [address],  2) [place] 3)  [address], [place] ([phone]) [phone]  [phone]   [place] 214-125-7907 or [phone] (After Hours)      Take the prescriptions as directed.  Apply moist heat or ice to the area(s) of discomfort, for 15 minutes at a time, several times per day for the next few days.  Do not fall asleep on a heating or ice pack.  Use your albuterol inhaler (2 to 4 puffs) every 4 hours for the next 7 days, then as needed for cough, wheezing, or shortness of breath.  Call your regular medical doctor tomorrow morning to schedule a follow up appointment within the next 3 days.  Return to the Emergency Department immediately sooner if worsening.

## 2015-04-01 NOTE — ED Provider Notes (Signed)
CSN: [id number]     Arrival date & time 04/01/15  1039 History   First MD Initiated Contact with Patient 04/01/15 1254     Chief Complaint  Patient presents with  . Chest Pain     HPI Pt was seen at 1315. Per pt, c/o gradual onset and persistence of constant upper mid-sternal chest "pain" and cough that began 2 days ago. Pt describes the discomfort as constant, "sore" and "aching." Denies SOB, no palpitations, no abd pain, no N/V/D, no fevers, no rash.    Past Medical History  Diagnosis Date  . Chronic schizophrenia ([place])   . Trouble in sleeping   . Depression   . Vulvar mass 11/07/2013  . Radiation 12/26/13-02/13/14    45 gray to pelvis, pelvic nodes boosted to 55 gray, vulvar area boosted to 59.4 gray   Past Surgical History  Procedure Laterality Date  . Tubal ligation Bilateral   . Vulva / perineum biopsy  12/02/13  . Vulvectomy partial  2003   Family History  Problem Relation Age of Onset  . Seizures Mother   . Hypertension Sister    Social History  Substance Use Topics  . Smoking status: Current Every Day Smoker -- 1.00 packs/day for 15 years    Types: Cigars  . Smokeless tobacco: Former Systems developer    Types: Snuff, Chew  . Alcohol Use: No   OB History    Gravida Para Term Preterm AB TAB SAB Ectopic Multiple Living   3 3        3      Review of Systems ROS: Statement: All systems negative except as marked or noted in the HPI; Constitutional: Negative for fever and chills. ; ; Eyes: Negative for eye pain, redness and discharge. ; ; ENMT: Negative for ear pain, hoarseness, nasal congestion, sinus pressure and sore throat. ; ; Cardiovascular:  Negative for palpitations, diaphoresis, dyspnea and peripheral edema. ; ; Respiratory: +cough. Negative for wheezing and stridor. ; ; Gastrointestinal: Negative for nausea, vomiting, diarrhea, abdominal pain, blood in stool, hematemesis, jaundice and rectal bleeding. . ; ; Genitourinary: Negative for dysuria, flank pain and hematuria. ; ;  Musculoskeletal: +chest wall pain. Negative for back pain and neck pain. Negative for swelling and trauma.; ; Skin: Negative for pruritus, rash, abrasions, blisters, bruising and skin lesion.; ; Neuro: Negative for headache, lightheadedness and neck stiffness. Negative for weakness, altered level of consciousness , altered mental status, extremity weakness, paresthesias, involuntary movement, seizure and syncope.      Allergies  Review of patient's allergies indicates no known allergies.  Home Medications   Prior to Admission medications   Medication Sig Start Date End Date Taking? Authorizing Provider  benztropine (COGENTIN) 1 MG tablet Take 1 mg by mouth 2 (two) times daily.   Yes Historical Provider, MD  haloperidol (HALDOL) 5 MG tablet Take 5 mg by mouth at bedtime.   Yes Historical Provider, MD  haloperidol decanoate (HALDOL DECANOATE) 100 MG/ML injection Inject into the muscle every 28 (twenty-eight) days.   Yes Historical Provider, MD  ibuprofen (ADVIL,MOTRIN) 400 MG tablet Take 400 mg by mouth every 6 (six) hours as needed.   Yes Historical Provider, MD  loperamide (IMODIUM A-D) 2 MG tablet Take 2 mg by mouth 4 (four) times daily as needed for diarrhea or loose stools. Do not take more than 8 tablets in 24 hours. 01/09/14  Yes Gery Pray, MD  solifenacin (VESICARE) 10 MG tablet Take by mouth daily.   Yes Historical Provider, MD  traZODone (DESYREL) 50 MG tablet Take 50 mg by mouth at bedtime. Takes 3 tabs at bedtime   Yes Historical Provider, MD  emollient (BIAFINE) cream Apply topically as needed.    Historical Provider, MD  HYDROcodone-acetaminophen (NORCO/VICODIN) 5-325 MG per tablet Take 1 tablet by mouth every 4 (four) hours as needed for moderate pain. Patient not taking: Reported on 03/26/2015 02/27/14   Gery Pray, MD  prochlorperazine (COMPAZINE) 10 MG tablet Take 1 tablet (10 mg total) by mouth every 6 (six) hours as needed for nausea or vomiting. Patient not taking: Reported  on 03/26/2015 04/03/14   Gery Pray, MD  silver sulfADIAZINE (SILVADENE) 1 % cream Apply 1 application topically 2 (two) times daily. Apply to red/open areas twice a day.  Clean site with clean, wet washcloth before each application. 02/05/14   Gery Pray, MD   BP 91/66 mmHg  Pulse 72  Temp(Src) 98 F (36.7 C) (Oral)  Resp 13  Ht 5\' 6"  (1.676 m)  Wt 143 lb (64.864 kg)  BMI 23.09 kg/m2  SpO2 97% Physical Exam  1320: Physical examination:  Nursing notes reviewed; Vital signs and O2 SAT reviewed;  Constitutional: Well developed, Well nourished, Well hydrated, In no acute distress; Head:  Normocephalic, atraumatic; Eyes: EOMI, PERRL, No scleral icterus; ENMT: Mouth and pharynx normal, Mucous membranes moist; Neck: Supple, Full range of motion, No lymphadenopathy; Cardiovascular: Regular rate and rhythm, No gallop; Respiratory: Breath sounds clear & equal bilaterally, No wheezes.  Speaking full sentences with ease, Normal respiratory effort/excursion; Chest: +upper parasternal areas tender to palp. No rash, no deformity. Movement normal; Abdomen: Soft, Nontender, Nondistended, Normal bowel sounds; Genitourinary: No CVA tenderness; Extremities: Pulses normal, No tenderness, No edema, No calf edema or asymmetry.; Neuro: AA&Ox3, Major CN grossly intact.  Speech clear. No gross focal motor or sensory deficits in extremities.; Skin: Color normal, Warm, Dry.   ED Course  Procedures (including critical care time) Labs Review   Imaging Review  I have personally reviewed and evaluated these images and lab results as part of my medical decision-making.   EKG Interpretation   Date/Time:  Wednesday April 01 2015 10:47:12 EST Ventricular Rate:  66 PR Interval:  216 QRS Duration: 90 QT Interval:  428 QTC Calculation: 448 R Axis:   13 Text Interpretation:  Sinus rhythm with 1st degree A-V block Possible  Anterior infarct , age undetermined No old tracing to compare Confirmed by  [facility]  MD,  Nunzio Cory [phone]) on 04/01/2015 1:49:36 PM      MDM  MDM Reviewed: previous chart, nursing note and vitals Reviewed previous: labs and ECG Interpretation: labs, ECG and x-ray     Results for orders placed or performed during the hospital encounter of 04/01/15  D-dimer, quantitative  Result Value Ref Range   D-Dimer, Quant <0.27 0.00 - 0.50 ug/mL-FEU  Troponin I  Result Value Ref Range   Troponin I <0.03 <0.031 ng/mL  CBC with Differential  Result Value Ref Range   WBC 4.8 4.0 - 10.5 K/uL   RBC 4.73 3.87 - 5.11 MIL/uL   Hemoglobin 15.3 (H) 12.0 - 15.0 g/dL   HCT 44.0 36.0 - 46.0 %   MCV 93.0 78.0 - 100.0 fL   MCH 32.3 26.0 - 34.0 pg   MCHC 34.8 30.0 - 36.0 g/dL   RDW 14.6 11.5 - 15.5 %   Platelets 134 (L) 150 - 400 K/uL   Neutrophils Relative % 74 %   Neutro Abs 3.5 1.7 - 7.7 K/uL  Lymphocytes Relative 16 %   Lymphs Abs 0.8 0.7 - 4.0 K/uL   Monocytes Relative 9 %   Monocytes Absolute 0.4 0.1 - 1.0 K/uL   Eosinophils Relative 1 %   Eosinophils Absolute 0.0 0.0 - 0.7 K/uL   Basophils Relative 0 %   Basophils Absolute 0.0 0.0 - 0.1 K/uL  Basic metabolic panel  Result Value Ref Range   Sodium 143 135 - 145 mmol/L   Potassium 3.9 3.5 - 5.1 mmol/L   Chloride 106 101 - 111 mmol/L   CO2 26 22 - 32 mmol/L   Glucose, Bld 99 65 - 99 mg/dL   BUN 9 6 - 20 mg/dL   Creatinine, Ser 0.66 0.44 - 1.00 mg/dL   Calcium 9.6 8.9 - 10.3 mg/dL   GFR calc non Af Amer >60 >60 mL/min   GFR calc Af Amer >60 >60 mL/min   Anion gap 11 5 - 15   Dg Chest 2 View 04/01/2015  CLINICAL DATA:  Pt reports productive cough with yellow sputum and intermittent chest pain x 2 days. Denies fever. Denies anything making chest pain better or worse. Says it just comes and goes. Denies any SOB. EXAM: CHEST  2 VIEW COMPARISON:  PET scan 09/30/2014. FINDINGS: The heart size and mediastinal contours are within normal limits. Both lungs are clear. The visualized skeletal structures are unremarkable. IMPRESSION: No  active cardiopulmonary disease. Electronically Signed   By: Staci Righter M.D.   On: 04/01/2015 11:15    1615:  Doubt PE as cause for symptoms with normal d-dimer and low risk [place].  Doubt ACS as cause for symptoms with normal troponin and unchanged EKG from previous after 2 days of constant symptoms. Feels better after meds and wants to go home now. Tx symptomatically at this time. Dx and testing d/w pt.  Questions answered.  Verb understanding, agreeable to d/c home with outpt f/u.    Francine Graven, DO 04/07/15 [id number]

## 2015-04-01 NOTE — ED Notes (Signed)
Pt reports productive cough with yellow sputum and intermittent chest pain x 2 days.  Denies fever.  Denies anything making chest pain better or worse.  Says it just comes and goes.  Denies any SOB.

## 2015-04-10 ENCOUNTER — Telehealth: Payer: Self-pay | Admitting: Oncology

## 2015-04-10 NOTE — Telephone Encounter (Signed)
Briana Wiley called and said Briana Wiley is experiencing pain on and off in her "bottom and private area."  [name] is Clinical biochemist of the group home where [name] lives.  She is wondering if Briana Wiley can get a prescription for pain medication to take PRN.  Advised her that Dr. Sondra Come will be contacted and we will call her back.

## 2015-04-15 ENCOUNTER — Other Ambulatory Visit: Payer: Self-pay | Admitting: Radiation Oncology

## 2015-04-15 MED ORDER — HYDROCODONE-ACETAMINOPHEN 5-325 MG PO TABS: 1.0000 | ORAL_TABLET | ORAL | Status: DC | PRN

## 2015-04-29 ENCOUNTER — Telehealth: Payer: Self-pay | Admitting: Oncology

## 2015-04-29 NOTE — Telephone Encounter (Signed)
Left a message for [facility] notifying her that a pain prescription is available for [facility] to pick up.

## 2015-05-21 DIAGNOSIS — F209 Schizophrenia, unspecified: Secondary | ICD-10-CM | POA: Diagnosis not present

## 2015-05-27 DIAGNOSIS — F17200 Nicotine dependence, unspecified, uncomplicated: Secondary | ICD-10-CM | POA: Diagnosis not present

## 2015-05-27 DIAGNOSIS — R32 Unspecified urinary incontinence: Secondary | ICD-10-CM | POA: Diagnosis not present

## 2015-05-27 DIAGNOSIS — F209 Schizophrenia, unspecified: Secondary | ICD-10-CM | POA: Diagnosis not present

## 2015-06-24 ENCOUNTER — Encounter: Payer: Self-pay | Admitting: Gynecologic Oncology

## 2015-06-24 ENCOUNTER — Ambulatory Visit: Payer: Medicare Other | Attending: Gynecologic Oncology | Admitting: Gynecologic Oncology

## 2015-06-24 VITALS — Temp 98.2°F | Resp 22 | Wt 149.2 lb

## 2015-06-24 DIAGNOSIS — F329 Major depressive disorder, single episode, unspecified: Secondary | ICD-10-CM | POA: Diagnosis not present

## 2015-06-24 DIAGNOSIS — F200 Paranoid schizophrenia: Secondary | ICD-10-CM | POA: Diagnosis not present

## 2015-06-24 DIAGNOSIS — D071 Carcinoma in situ of vulva: Secondary | ICD-10-CM | POA: Diagnosis not present

## 2015-06-24 DIAGNOSIS — C519 Malignant neoplasm of vulva, unspecified: Secondary | ICD-10-CM | POA: Diagnosis not present

## 2015-06-24 DIAGNOSIS — F17210 Nicotine dependence, cigarettes, uncomplicated: Secondary | ICD-10-CM | POA: Diagnosis not present

## 2015-06-24 DIAGNOSIS — Z923 Personal history of irradiation: Secondary | ICD-10-CM | POA: Diagnosis not present

## 2015-06-24 DIAGNOSIS — Z8544 Personal history of malignant neoplasm of other female genital organs: Secondary | ICD-10-CM | POA: Diagnosis not present

## 2015-06-24 NOTE — Patient Instructions (Signed)
We will call you with results of your biopsy , if everything is ok , plan to follow up in 6 months.  Thank you!

## 2015-06-24 NOTE — Progress Notes (Signed)
Follow-up Note Note: Gyn-Onc  Consult was initially requested by Dr. Elonda Husky for the evaluation of Briana Wiley 45 y.o. female with squamous cell cancer of the vulva, clinical stage II  CC:   Vulvar cancer  Assessment/Plan:  [name]  is a 45 y.o.  year old who has chronic paranoid schizophrenia, tobacco abuse and a history of clinical stage II vulvar SCC s/p primary radiation (completed February 13, 2014).  No gross recurrence on today's exam, but the vulva anteriorally shows signs of possible VIN3. Will follow-up biopsies. If VIN3, will proceed with CO2 laser. If benign, will plan for followup as scheduled (in 3 months with Dr Sondra Come, or 6 months with myself).  HPI: Briana Wiley is a 45 year old woman who was seen in consultation at the request of Dr. Elonda Husky for clinical stage II vulvar squamous cell carcinoma. 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 She also has a history significant for paranoid schizophrenia and resides in her care facility for this. She is an extremely heavy smoker and continues to smoke 1-2 packs per day. She began experiencing vulvar irritation in the past few months. She was seen by Dr. Elonda Husky for this. He recognized the posterior 5 cm raised erythematous vulva mass and performed a biopsy. Pathology revealed at least high-grade VIN associated with an area highly suspicious for squamous cell carcinoma.  PET scan was performed which revealed positive inguinal and pelvic nodes.   She was treated with primary radiation with external beam radiation (45 gray to the pelvis and 55 gray with simulated integrated boost to the pelvic nodes). The patient then received a boost to the vulva area to a cumulative dose of 59.4 gray. Treatment dates were 12/26/13-02/13/14.  She tolerated therapy well with no issues.  Interval History: She has intermittent mild vulvar pain  Current Meds:  Outpatient Encounter  Prescriptions as of 06/24/2015  Medication Sig  . albuterol (PROVENTIL HFA;VENTOLIN HFA) 108 (90 BASE) MCG/ACT inhaler Inhale 2 puffs into the lungs every 4 (four) hours as needed for wheezing or shortness of breath.  . benzonatate (TESSALON) 100 MG capsule Take 1 capsule (100 mg total) by mouth 3 (three) times daily as needed for cough.  . benztropine (COGENTIN) 1 MG tablet Take 1 mg by mouth 2 (two) times daily.  Marland Kitchen emollient (BIAFINE) cream Apply topically as needed.  . haloperidol (HALDOL) 5 MG tablet Take 5 mg by mouth at bedtime.  . haloperidol decanoate (HALDOL DECANOATE) 100 MG/ML injection Inject into the muscle every 28 (twenty-eight) days.  Marland Kitchen HYDROcodone-acetaminophen (NORCO/VICODIN) 5-325 MG tablet Take 1 tablet by mouth every 4 (four) hours as needed for moderate pain.  Marland Kitchen ibuprofen (ADVIL,MOTRIN) 400 MG tablet Take 400 mg by mouth every 6 (six) hours as needed.  . naproxen (NAPROSYN) 250 MG tablet Take 1 tablet (250 mg total) by mouth 2 (two) times daily as needed for mild pain or moderate pain (take with food).  . silver sulfADIAZINE (SILVADENE) 1 % cream Apply 1 application topically 2 (two) times daily. Apply to red/open areas twice a day.  Clean site with clean, wet washcloth before each application.  . solifenacin (VESICARE) 10 MG tablet Take by mouth daily.  . traZODone (DESYREL) 50 MG tablet Take 50 mg by mouth at bedtime. Takes 3 tabs at bedtime  . loperamide (IMODIUM A-D) 2 MG tablet Take 2 mg by mouth 4 (four) times daily as needed for diarrhea or loose  stools. Reported on 06/24/2015  . [DISCONTINUED] prochlorperazine (COMPAZINE) 10 MG tablet Take 1 tablet (10 mg total) by mouth every 6 (six) hours as needed for nausea or vomiting. (Patient not taking: Reported on 03/26/2015)   No facility-administered encounter medications on file as of 06/24/2015.    Allergy: No Known Allergies  Social Hx:   Social History   Social History  . Marital Status: Legally Separated    Spouse  Name: N/A  . Number of Children: 3  . Years of Education: N/A   Occupational History  .     Social History Main Topics  . Smoking status: Current Every Day Smoker -- 1.00 packs/day for 15 years    Types: Cigars  . Smokeless tobacco: Former Systems developer    Types: Snuff, Chew  . Alcohol Use: No  . Drug Use: No  . Sexual Activity: No   Other Topics Concern  . Not on file   Social History Narrative    Past Surgical Hx:  Past Surgical History  Procedure Laterality Date  . Tubal ligation Bilateral   . Vulva / perineum biopsy  12/02/13  . Vulvectomy partial  2003    Past Medical Hx:  Past Medical History  Diagnosis Date  . Chronic schizophrenia ([place])   . Trouble in sleeping   . Depression   . Vulvar mass 11/07/2013  . Radiation 12/26/13-02/13/14    45 gray to pelvis, pelvic nodes boosted to 55 gray, vulvar area boosted to 59.4 gray    Past Gynecological History:  G3P3, SVD x3  No LMP recorded. Patient has had an ablation.  Family Hx:  Family History  Problem Relation Age of Onset  . Seizures Mother   . Hypertension Sister     Review of Systems:  Constitutional  Feels well,    ENT Normal appearing ears and nares bilaterally Skin/Breast  No rash, sores, jaundice, itching, dryness Cardiovascular  No chest pain, shortness of breath, or edema  Pulmonary  No cough or wheeze.  Gastro Intestinal  No nausea, vomitting, or diarrhoea. No bright red blood per rectum, no abdominal pain, change in bowel movement, or constipation.  Genito Urinary  No frequency, urgency, dysuria, see HPI Musculo Skeletal  No myalgia, arthralgia, joint swelling or pain  Neurologic  No weakness, numbness, change in gait,  Psychology  No depression, anxiety, insomnia.   Vitals:  Temperature 98.2 F (36.8 C), temperature source Oral, resp. rate 22, weight 149 lb 3.2 oz (67.677 kg).  Physical Exam: WD in NAD Neck  Supple NROM, without any enlargements.  Lymph Node Survey No cervical  supraclavicular or inguinal adenopathy Cardiovascular  Pulse normal rate, regularity and rhythm. S1 and S2 normal.  Lungs  Clear to auscultation bilateraly, without wheezes/crackles/rhonchi. Good air movement.  Skin  No rash/lesions/breakdown  Psychiatry  Alert and oriented to person, place, and time  Abdomen  Normoactive bowel sounds, abdomen soft, non-tender and thin without evidence of hernia.  Back No CVA tenderness Genito Urinary  Vulva/vagina:complete resolution of left posterior vulvar lesion. There are nodular hyperkeratotic areas of leukoplakia anteriorally on the right and circumferentially involving labia minora. These were biopsied. Vulva biopsy: 2cc of 1% lidocaine was infiltrated into the anterior right labia minora (into this site of leukoplakia) and a 74mm punch biopsy of this tissue was taken. Hemostasis was achieved with silver nitrate.  The left labia minora (mid portion) was also biopsied.  Bladder/urethra:  No lesions or masses, well supported bladder  Vagina: distal 1cm of right vagina  involved with lesion.  Cervix: Normal appearing, no lesions.  Uterus: deferred exam  Adnexa: deferred exam  Rectal  Good tone, no masses no cul de sac nodularity.  Extremities  No bilateral cyanosis, clubbing or edema.   Donaciano Eva, MD   06/24/2015, 12:56 PM

## 2015-07-02 ENCOUNTER — Telehealth: Payer: Self-pay | Admitting: Gynecologic Oncology

## 2015-07-02 NOTE — Telephone Encounter (Signed)
Patient's caretaker at the patient's facility called back.  Informed of biopsy results.  Advised to tell the patient it is pre-cancer, NOT CANCER.  Advised of Dr. Serita Grit recommendations for a laser procedure of the vulva for treatment of VIN III.  Appt made for March 22.  No concerns voiced.  Advised to call for any needs.

## 2015-07-15 ENCOUNTER — Ambulatory Visit: Payer: Medicare Other | Attending: Gynecologic Oncology | Admitting: Gynecologic Oncology

## 2015-07-15 ENCOUNTER — Encounter: Payer: Self-pay | Admitting: Gynecologic Oncology

## 2015-07-15 VITALS — BP 117/71 | HR 65 | Temp 98.2°F | Resp 18 | Ht 66.0 in | Wt 149.8 lb

## 2015-07-15 DIAGNOSIS — F329 Major depressive disorder, single episode, unspecified: Secondary | ICD-10-CM | POA: Insufficient documentation

## 2015-07-15 DIAGNOSIS — D071 Carcinoma in situ of vulva: Secondary | ICD-10-CM | POA: Insufficient documentation

## 2015-07-15 DIAGNOSIS — F209 Schizophrenia, unspecified: Secondary | ICD-10-CM | POA: Insufficient documentation

## 2015-07-15 DIAGNOSIS — F17210 Nicotine dependence, cigarettes, uncomplicated: Secondary | ICD-10-CM | POA: Diagnosis not present

## 2015-07-15 DIAGNOSIS — Z8544 Personal history of malignant neoplasm of other female genital organs: Secondary | ICD-10-CM

## 2015-07-15 DIAGNOSIS — C519 Malignant neoplasm of vulva, unspecified: Secondary | ICD-10-CM

## 2015-07-15 MED ORDER — HYDROCODONE-ACETAMINOPHEN 5-325 MG PO TABS: 1.0000 | ORAL_TABLET | ORAL | Status: DC | PRN

## 2015-07-15 MED ORDER — NAPROXEN 250 MG PO TABS: 250.0000 mg | ORAL_TABLET | Freq: Two times a day (BID) | ORAL | Status: DC | PRN

## 2015-07-15 NOTE — Patient Instructions (Signed)
Plan to have laser procedure on April 4 , 2017 at [facility]. You will receive a call from the pre surgical department to discuss instructions .  Please call if you have any questions or concerns.  Thank you !

## 2015-07-15 NOTE — Progress Notes (Signed)
Follow-up Note Note: Gyn-Onc  Consult was initially requested by Dr. Elonda Wiley for the evaluation of Briana Wiley 45 y.o. female with squamous cell cancer of the vulva, clinical stage II   Chief Complaint  Patient presents with  . VIN III    Follow up     Assessment/Plan:  [name]  is a 45 y.o.  year old who has chronic paranoid schizophrenia, tobacco abuse and a history of clinical stage II vulvar SCC s/p primary radiation (completed February 13, 2014).  Now with diffuse VIN3 on anterior vulva and circumferential introitus. Will proceed with CO2 laser.  I discussed the procedure, its risks and anticipated recovery and recommended after-care. All questions were answered.  HPI: Briana Wiley is a 45 year old woman who was seen in consultation at the request of Dr. Elonda Wiley for clinical stage II vulvar squamous cell carcinoma. 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 She also has a history significant for paranoid schizophrenia and resides in her care facility for this. She is an extremely heavy smoker and continues to smoke 1-2 packs per day. She began experiencing vulvar irritation in the past few months. She was seen by Dr. Elonda Wiley for this. He recognized the posterior 5 cm raised erythematous vulva mass and performed a biopsy. Pathology revealed at least high-grade VIN associated with an area highly suspicious for squamous cell carcinoma.  PET scan was performed which revealed positive inguinal and pelvic nodes.   She was treated with primary radiation with external beam radiation (45 gray to the pelvis and 55 gray with simulated integrated boost to the pelvic nodes). The patient then received a boost to the vulva area to a cumulative dose of 59.4 gray. Treatment dates were 12/26/13-02/13/14.  She tolerated therapy well with no issues.  Interval History: She has intermittent mild vulvar pain. Biopsy of vulva on 06/24/15 showed  VIN III.  Current Meds:  Outpatient Encounter Prescriptions as of 07/15/2015  Medication Sig  . albuterol (PROVENTIL HFA;VENTOLIN HFA) 108 (90 BASE) MCG/ACT inhaler Inhale 2 puffs into the lungs every 4 (four) hours as needed for wheezing or shortness of breath.  . benzonatate (TESSALON) 100 MG capsule Take 1 capsule (100 mg total) by mouth 3 (three) times daily as needed for cough.  . benztropine (COGENTIN) 1 MG tablet Take 1 mg by mouth 2 (two) times daily.  Marland Kitchen emollient (BIAFINE) cream Apply topically as needed.  . haloperidol (HALDOL) 5 MG tablet Take 5 mg by mouth at bedtime.  . haloperidol decanoate (HALDOL DECANOATE) 100 MG/ML injection Inject into the muscle every 28 (twenty-eight) days.  Marland Kitchen ibuprofen (ADVIL,MOTRIN) 400 MG tablet Take 400 mg by mouth every 6 (six) hours as needed.  . loperamide (IMODIUM A-D) 2 MG tablet Take 2 mg by mouth 4 (four) times daily as needed for diarrhea or loose stools. Reported on 06/24/2015  . silver sulfADIAZINE (SILVADENE) 1 % cream Apply 1 application topically 2 (two) times daily. Apply to red/open areas twice a day.  Clean site with clean, wet washcloth before each application.  . solifenacin (VESICARE) 10 MG tablet Take by mouth daily.  . traZODone (DESYREL) 50 MG tablet Take 50 mg by mouth at bedtime. Takes 3 tabs at bedtime  . HYDROcodone-acetaminophen (NORCO/VICODIN) 5-325 MG tablet Take 1 tablet by mouth every 4 (four) hours as needed for moderate pain.  . naproxen (NAPROSYN) 250 MG tablet Take 1 tablet (250 mg total) by mouth 2 (  two) times daily as needed for mild pain or moderate pain (take with food).  . [DISCONTINUED] HYDROcodone-acetaminophen (NORCO/VICODIN) 5-325 MG tablet Take 1 tablet by mouth every 4 (four) hours as needed for moderate pain. (Patient not taking: Reported on 07/15/2015)  . [DISCONTINUED] naproxen (NAPROSYN) 250 MG tablet Take 1 tablet (250 mg total) by mouth 2 (two) times daily as needed for mild pain or moderate pain (take with  food). (Patient not taking: Reported on 07/15/2015)   No facility-administered encounter medications on file as of 07/15/2015.    Allergy: No Known Allergies  Social Hx:   Social History   Social History  . Marital Status: Legally Separated    Spouse Name: N/A  . Number of Children: 3  . Years of Education: N/A   Occupational History  .     Social History Main Topics  . Smoking status: Current Every Day Smoker -- 1.00 packs/day for 15 years    Types: Cigars  . Smokeless tobacco: Former Systems developer    Types: Snuff, Chew  . Alcohol Use: No  . Drug Use: No  . Sexual Activity: No   Other Topics Concern  . Not on file   Social History Narrative    Past Surgical Hx:  Past Surgical History  Procedure Laterality Date  . Tubal ligation Bilateral   . Vulva / perineum biopsy  12/02/13  . Vulvectomy partial  2003    Past Medical Hx:  Past Medical History  Diagnosis Date  . Chronic schizophrenia ([place])   . Trouble in sleeping   . Depression   . Vulvar mass 11/07/2013  . Radiation 12/26/13-02/13/14    45 gray to pelvis, pelvic nodes boosted to 55 gray, vulvar area boosted to 59.4 gray    Past Gynecological History:  G3P3, SVD x3  No LMP recorded. Patient has had an ablation.  Family Hx:  Family History  Problem Relation Age of Onset  . Seizures Mother   . Hypertension Sister     Review of Systems:  Constitutional  Feels well,    ENT Normal appearing ears and nares bilaterally Skin/Breast  No rash, sores, jaundice, itching, dryness Cardiovascular  No chest pain, shortness of breath, or edema  Pulmonary  No cough or wheeze.  Gastro Intestinal  No nausea, vomitting, or diarrhoea. No bright red blood per rectum, no abdominal pain, change in bowel movement, or constipation.  Genito Urinary  No frequency, urgency, dysuria, see HPI Musculo Skeletal  No myalgia, arthralgia, joint swelling or pain  Neurologic  No weakness, numbness, change in gait,  Psychology  No  depression, anxiety, insomnia.   Vitals:  Blood pressure 117/71, pulse 65, temperature 98.2 F (36.8 C), temperature source Oral, resp. rate 18, height 5\' 6"  (1.676 m), weight 149 lb 12.8 oz (67.949 kg), SpO2 99 %.  Physical Exam: WD in NAD Neck  Supple NROM, without any enlargements.  Lymph Node Survey No cervical supraclavicular or inguinal adenopathy Cardiovascular  Pulse normal rate, regularity and rhythm. S1 and S2 normal.  Lungs  Clear to auscultation bilateraly, without wheezes/crackles/rhonchi. Good air movement.  Skin  No rash/lesions/breakdown  Psychiatry  Alert and oriented to person, place, and time  Abdomen  Normoactive bowel sounds, abdomen soft, non-tender and thin without evidence of hernia.  Back No CVA tenderness Genito Urinary  Vulva/vagina:complete resolution of left posterior vulvar lesion. There are nodular hyperkeratotic areas of leukoplakia anteriorally on the right and circumferentially involving labia minora.   Bladder/urethra:  No lesions or masses, well  supported bladder  Vagina: distal 1cm of right vagina involved with lesion.  Cervix: Normal appearing, no lesions.  Uterus: deferred exam  Adnexa: deferred exam  Rectal  Good tone, no masses no cul de sac nodularity.  Extremities  No bilateral cyanosis, clubbing or edema.   Donaciano Eva, MD   07/15/2015, 5:13 PM

## 2015-07-24 NOTE — Anesthesia Preprocedure Evaluation (Addendum)
Anesthesia Evaluation  Patient identified by MRN, date of birth, ID band Patient awake    Reviewed: Allergy & Precautions, NPO status , Patient's Chart, lab work & pertinent test results  Airway Mallampati: II   Neck ROM: Full    Dental  (+) Partial Upper   Pulmonary neg pulmonary ROS, Current Smoker (15 pack year),    breath sounds clear to auscultation       Cardiovascular negative cardio ROS   Rhythm:Regular     Neuro/Psych Depression Schizophrenia negative neurological ROS     GI/Hepatic negative GI ROS, Neg liver ROS,   Endo/Other  negative endocrine ROS  Renal/GU negative Renal ROS   Vaginal CA, SP radiation negative genitourinary   Musculoskeletal negative musculoskeletal ROS (+)   Abdominal   Peds negative pediatric ROS (+)  Hematology negative hematology ROS (+)   Anesthesia Other Findings   Reproductive/Obstetrics negative OB ROS                            Anesthesia Physical Anesthesia Plan  ASA: II  Anesthesia Plan: General   Post-op Pain Management:    Induction: Intravenous  Airway Management Planned: LMA  Additional Equipment:   Intra-op Plan:   Post-operative Plan: Extubation in OR  Informed Consent: I have reviewed the patients History and Physical, chart, labs and discussed the procedure including the risks, benefits and alternatives for the proposed anesthesia with the patient or authorized representative who has indicated his/her understanding and acceptance.     Plan Discussed with:   Anesthesia Plan Comments:         Anesthesia Quick Evaluation

## 2015-07-27 ENCOUNTER — Encounter (HOSPITAL_BASED_OUTPATIENT_CLINIC_OR_DEPARTMENT_OTHER): Payer: Self-pay | Admitting: *Deleted

## 2015-07-27 NOTE — Progress Notes (Signed)
Pt instructed npo pmn 3/3 xcogentin, vesicare w sip of water.  Npo to include no smoking p mn as well.  To [facility] 3/4 @ 1130.  Needs hgb on arrival. Pt has schizophrenic, lives in group home. No assist needed for ADL's.  Pt to be d/c'd back to [facility] .  They will provide transportation.

## 2015-07-28 ENCOUNTER — Encounter (HOSPITAL_BASED_OUTPATIENT_CLINIC_OR_DEPARTMENT_OTHER): Payer: Self-pay | Admitting: *Deleted

## 2015-07-28 ENCOUNTER — Ambulatory Visit (HOSPITAL_BASED_OUTPATIENT_CLINIC_OR_DEPARTMENT_OTHER)
Admission: RE | Admit: 2015-07-28 | Discharge: 2015-07-28 | Disposition: A | Discharge status: Home / Self Care | Payer: Medicare Other | Source: Ambulatory Visit | Attending: Gynecologic Oncology | Admitting: Gynecologic Oncology

## 2015-07-28 ENCOUNTER — Ambulatory Visit (HOSPITAL_BASED_OUTPATIENT_CLINIC_OR_DEPARTMENT_OTHER): Payer: Medicare Other | Admitting: Anesthesiology

## 2015-07-28 ENCOUNTER — Encounter (HOSPITAL_BASED_OUTPATIENT_CLINIC_OR_DEPARTMENT_OTHER)
Admission: RE | Disposition: A | Discharge status: Home / Self Care | Payer: Self-pay | Source: Ambulatory Visit | Attending: Gynecologic Oncology

## 2015-07-28 DIAGNOSIS — F200 Paranoid schizophrenia: Secondary | ICD-10-CM | POA: Insufficient documentation

## 2015-07-28 DIAGNOSIS — D071 Carcinoma in situ of vulva: Secondary | ICD-10-CM | POA: Diagnosis present

## 2015-07-28 DIAGNOSIS — Z79899 Other long term (current) drug therapy: Secondary | ICD-10-CM | POA: Insufficient documentation

## 2015-07-28 DIAGNOSIS — Z923 Personal history of irradiation: Secondary | ICD-10-CM | POA: Insufficient documentation

## 2015-07-28 DIAGNOSIS — F17210 Nicotine dependence, cigarettes, uncomplicated: Secondary | ICD-10-CM | POA: Insufficient documentation

## 2015-07-28 DIAGNOSIS — Z8544 Personal history of malignant neoplasm of other female genital organs: Secondary | ICD-10-CM

## 2015-07-28 HISTORY — PX: CO2 LASER APPLICATION: SHX5778

## 2015-07-28 HISTORY — DX: Presence of dental prosthetic device (complete) (partial): Z97.2

## 2015-07-28 LAB — POCT HEMOGLOBIN-HEMACUE: Hemoglobin: 14.8 g/dL (ref 12.0–15.0)

## 2015-07-28 SURGERY — CO2 LASER APPLICATION
Anesthesia: General | Site: Vulva

## 2015-07-28 MED ORDER — LIDOCAINE HCL 1 % IJ SOLN
INTRAMUSCULAR | Status: DC | PRN
  Administered 2015-07-28: 10 mL

## 2015-07-28 MED ORDER — ACETIC ACID 5 % SOLN
Status: DC | PRN
Start: 2015-07-28 — End: 2015-07-28
  Administered 2015-07-28: 1 via TOPICAL

## 2015-07-28 MED ORDER — MIDAZOLAM HCL 2 MG/2ML IJ SOLN
INTRAMUSCULAR | Status: AC
  Filled 2015-07-28: qty 2

## 2015-07-28 MED ORDER — DEXAMETHASONE SODIUM PHOSPHATE 10 MG/ML IJ SOLN
INTRAMUSCULAR | Status: DC | PRN
  Administered 2015-07-28: 10 mg via INTRAVENOUS

## 2015-07-28 MED ORDER — SILVER SULFADIAZINE 1 % EX CREA
TOPICAL_CREAM | CUTANEOUS | Status: DC | PRN
  Administered 2015-07-28: 1 via TOPICAL

## 2015-07-28 MED ORDER — ONDANSETRON HCL 4 MG/2ML IJ SOLN
INTRAMUSCULAR | Status: AC
  Filled 2015-07-28: qty 2

## 2015-07-28 MED ORDER — PROPOFOL 10 MG/ML IV BOLUS
INTRAVENOUS | Status: AC
  Filled 2015-07-28: qty 20

## 2015-07-28 MED ORDER — PROMETHAZINE HCL 25 MG/ML IJ SOLN
6.2500 mg | INTRAMUSCULAR | Status: DC | PRN
  Filled 2015-07-28: qty 1

## 2015-07-28 MED ORDER — LIDOCAINE HCL (CARDIAC) 20 MG/ML IV SOLN
INTRAVENOUS | Status: DC | PRN
  Administered 2015-07-28: 100 mg via INTRAVENOUS

## 2015-07-28 MED ORDER — MIDAZOLAM HCL 5 MG/5ML IJ SOLN
INTRAMUSCULAR | Status: DC | PRN
  Administered 2015-07-28: 2 mg via INTRAVENOUS

## 2015-07-28 MED ORDER — MEPERIDINE HCL 25 MG/ML IJ SOLN
6.2500 mg | INTRAMUSCULAR | Status: DC | PRN
  Filled 2015-07-28: qty 1

## 2015-07-28 MED ORDER — PROPOFOL 10 MG/ML IV BOLUS
INTRAVENOUS | Status: DC | PRN
Start: 2015-07-28 — End: 2015-07-28
  Administered 2015-07-28: 200 mg via INTRAVENOUS

## 2015-07-28 MED ORDER — FENTANYL CITRATE (PF) 100 MCG/2ML IJ SOLN
25.0000 ug | INTRAMUSCULAR | Status: DC | PRN
  Filled 2015-07-28: qty 1

## 2015-07-28 MED ORDER — LACTATED RINGERS IV SOLN
INTRAVENOUS | Status: DC
  Administered 2015-07-28 (×2): via INTRAVENOUS
  Filled 2015-07-28: qty 1000

## 2015-07-28 MED ORDER — OXYCODONE-ACETAMINOPHEN 10-325 MG PO TABS: 1.0000 | ORAL_TABLET | ORAL | Status: DC | PRN

## 2015-07-28 MED ORDER — KETOROLAC TROMETHAMINE 30 MG/ML IJ SOLN
INTRAMUSCULAR | Status: DC | PRN
  Administered 2015-07-28: 30 mg via INTRAVENOUS

## 2015-07-28 MED ORDER — FENTANYL CITRATE (PF) 100 MCG/2ML IJ SOLN
INTRAMUSCULAR | Status: AC
  Filled 2015-07-28: qty 2

## 2015-07-28 MED ORDER — LIDOCAINE HCL (CARDIAC) 20 MG/ML IV SOLN
INTRAVENOUS | Status: AC
Start: 2015-07-28 — End: 2015-07-28
  Filled 2015-07-28: qty 5

## 2015-07-28 MED ORDER — FENTANYL CITRATE (PF) 100 MCG/2ML IJ SOLN
INTRAMUSCULAR | Status: DC | PRN
  Administered 2015-07-28 (×4): 25 ug via INTRAVENOUS

## 2015-07-28 MED ORDER — ONDANSETRON HCL 4 MG/2ML IJ SOLN
INTRAMUSCULAR | Status: DC | PRN
  Administered 2015-07-28: 4 mg via INTRAVENOUS

## 2015-07-28 SURGICAL SUPPLY — 49 items
APPLICATOR COTTON TIP 6IN STRL (MISCELLANEOUS) ×4 IMPLANT
BAG DRN ANRFLXCHMBR STRAP LEK (BAG)
BAG URINE DRAINAGE (UROLOGICAL SUPPLIES) IMPLANT
BAG URINE LEG 19OZ MD ST LTX (BAG) IMPLANT
BLADE SURG 15 STRL LF DISP TIS (BLADE) IMPLANT
BLADE SURG 15 STRL SS (BLADE)
CANISTER SUCTION 1200CC (MISCELLANEOUS) IMPLANT
CANISTER SUCTION 2500CC (MISCELLANEOUS) IMPLANT
CATH FOLEY 2WAY SLVR  5CC 16FR (CATHETERS)
CATH FOLEY 2WAY SLVR 5CC 16FR (CATHETERS) IMPLANT
CATH ROBINSON RED A/P 16FR (CATHETERS) ×1 IMPLANT
CLOTH BEACON ORANGE TIMEOUT ST (SAFETY) ×2 IMPLANT
COVER BACK TABLE 60X90IN (DRAPES) ×2 IMPLANT
DEPRESSOR TONGUE BLADE STERILE (MISCELLANEOUS) ×2 IMPLANT
DRAPE LG THREE QUARTER DISP (DRAPES) IMPLANT
DRAPE UNDERBUTTOCKS STRL (DRAPE) ×2 IMPLANT
DRSG TELFA 3X8 NADH (GAUZE/BANDAGES/DRESSINGS) IMPLANT
ELECT BALL LEEP 3MM BLK (ELECTRODE) IMPLANT
ELECT REM PT RETURN 9FT ADLT (ELECTROSURGICAL) ×2
ELECTRODE REM PT RTRN 9FT ADLT (ELECTROSURGICAL) ×1 IMPLANT
GLOVE BIO SURGEON STRL SZ 6 (GLOVE) ×2 IMPLANT
GLOVE BIO SURGEON STRL SZ 6.5 (GLOVE) ×1 IMPLANT
GLOVE INDICATOR 6.5 STRL GRN (GLOVE) ×1 IMPLANT
GOWN STRL REUS W/ TWL LRG LVL3 (GOWN DISPOSABLE) ×2 IMPLANT
GOWN STRL REUS W/TWL LRG LVL3 (GOWN DISPOSABLE) ×4
KIT ROOM TURNOVER WOR (KITS) ×2 IMPLANT
LEGGING LITHOTOMY PAIR STRL (DRAPES) ×1 IMPLANT
NDL HYPO 25X1 1.5 SAFETY (NEEDLE) IMPLANT
NEEDLE HYPO 25X1 1.5 SAFETY (NEEDLE) IMPLANT
NS IRRIG 500ML POUR BTL (IV SOLUTION) IMPLANT
PACK BASIN DAY SURGERY FS (CUSTOM PROCEDURE TRAY) ×2 IMPLANT
PAD DRESSING TELFA 3X8 NADH (GAUZE/BANDAGES/DRESSINGS) IMPLANT
PAD OB MATERNITY 4.3X12.25 (PERSONAL CARE ITEMS) ×2 IMPLANT
PAD PREP 24X48 CUFFED NSTRL (MISCELLANEOUS) ×2 IMPLANT
PENCIL BUTTON HOLSTER BLD 10FT (ELECTRODE) IMPLANT
SCOPETTES 8  STERILE (MISCELLANEOUS) ×2
SCOPETTES 8 STERILE (MISCELLANEOUS) ×2 IMPLANT
SUT VIC AB 2-0 SH 27 (SUTURE)
SUT VIC AB 2-0 SH 27XBRD (SUTURE) IMPLANT
SUT VIC AB 3-0 PS2 18 (SUTURE)
SUT VIC AB 3-0 PS2 18XBRD (SUTURE) IMPLANT
SYR CONTROL 10ML LL (SYRINGE) ×1 IMPLANT
TOWEL OR 17X24 6PK STRL BLUE (TOWEL DISPOSABLE) ×4 IMPLANT
TRAY DSU PREP LF (CUSTOM PROCEDURE TRAY) ×2 IMPLANT
TUBE CONNECTING 12X1/4 (SUCTIONS) ×2 IMPLANT
VACUUM HOSE 7/8X10 W/ WAND (MISCELLANEOUS) IMPLANT
VACUUM HOSE/TUBING 7/8INX6FT (MISCELLANEOUS) ×2 IMPLANT
WATER STERILE IRR 500ML POUR (IV SOLUTION) ×3 IMPLANT
YANKAUER SUCT BULB TIP NO VENT (SUCTIONS) ×2 IMPLANT

## 2015-07-28 NOTE — Anesthesia Postprocedure Evaluation (Addendum)
Anesthesia Post Note  Patient: Briana Wiley  Procedure(s) Performed: Procedure(s) (LRB): CO2 LASER OF THE VULVA (N/A)  Patient location during evaluation: PACU Anesthesia Type: General Level of consciousness: awake and alert Pain management: pain level controlled Vital Signs Assessment: post-procedure vital signs reviewed and stable Respiratory status: spontaneous breathing, nonlabored ventilation, respiratory function stable and patient connected to nasal cannula oxygen Cardiovascular status: blood pressure returned to baseline and stable Postop Assessment: no signs of nausea or vomiting Anesthetic complications: no    Last Vitals:  Filed Vitals:   07/28/15 1445 07/28/15 1451  BP:  137/71  Pulse: 72 67  Temp:    Resp: 16 14    Last Pain: There were no vitals filed for this visit.               Tiajuana Amass

## 2015-07-28 NOTE — Discharge Instructions (Signed)
Vulvar Laser, Care After ACTIVITY  Rest as much as possible the first two weeks after discharge.  Arrange to have help from family or others with your daily activities when you go home.  Follow your caregiver's instruction about climbing stairs and driving a car (24 hours)  Increase activity gradually.  Do not exercise until you have permission from your caregiver.  LEG AND FOOT CARE If your doctor has removed lymph nodes from your groin area, there may be an increase in swelling of your legs and feet. You can help prevent swelling by doing the following:  Elevate your legs while sitting or lying down.  If your caregiver has ordered special stockings, wear them according to instructions.  Avoid standing in one place for long periods of time.  Call the physical therapy department if you have any questions about swelling or treatment for swelling.  Avoid salt in your diet. It can cause fluid retention and swelling.  Do not cross your legs, especially when sitting. NUTRITION  You may resume your normal diet.  Drink 6 to 8 glasses of fluids a day.  Eat a healthy, balanced diet including portions of food from the meat (protein), milk, fruit, vegetable, and bread groups.  Your caregiver may recommend you take a multivitamin with iron. ELIMINATION  You may notice that your stream of urine is at a different angle, and may tend to spray. Using a plastic funnel may help to decrease urine spray.  If constipation occurs, drink more liquids, and add more fruits, vegetables, and bran to your diet. You may take a mild laxative, such as Milk of Magnesia, Metamucil, or a stool softener such as Colace, with permission from your caregiver. HYGIENE  You may shower and wash your hair.  Check with your caregiver about tub baths.  Do not add any bath oils or chemicals to your bath water, after you have permission to take baths.  While passing urine, pour water from a bottle or spray over  your vulva to dilute the urine as it passes the incision (this will decrease burning and discomfort).  Clean yourself well after moving your bowels.  After urinating, do not wipe. Dap or pat dry with toilet paper or a dry cleath soft cloth.  A sitz bath will help keep your perineal area clean, reduce swelling, and provide comfort.  Avoid wearing underpants for the first 2 weeks and wear loose skirts to allow circulation of air around the incision  You should apply silvadine cream to the vulva after using the bathroom to urinate or have a bowel movement.  The stitches are self-dissolving and will absorb and disappear over a couple of months (it is normal to notice the knot from the stitches on toilet paper after voiding). HOME CARE INSTRUCTIONS   Apply a soft ice pack (or frozen bag of peas) to your perineum (vulva) every hour in the first 48 hours after surgery. This will reduce swelling.  Avoid activities that involve a lot of friction between your legs.  Avoid wearing pants or underpants in the 1st 2 weeks (skirts are preferable).  Take your temperature twice a day and record it, especially if you feel feverish or have chills.  Follow your caregiver's instructions about medicines, activity, and follow-up appointments after surgery.  Do not drink alcohol while taking pain medicine.  Change your dressing as advised by your caregiver.  You may take over-the-counter medicine for pain, recommended by your caregiver.  If your pain is not relieved with  medicine, call your caregiver.  Do not take aspirin because it can cause bleeding.  Do not douche or use tampons (use a nonperfumed sanitary pad).  Do not have sexual intercourse until your caregiver gives you permission (typically 6 weeks postoperatively). Hugging, kissing, and playful sexual activity is fine with your caregiver's permission.  Warm sitz baths, with your caregiver's permission, are helpful to control swelling and  discomfort.  Take showers instead of baths, until your caregiver gives you permission to take baths.  You may take a mild medicine for constipation, recommended by your caregiver. Bran foods and drinking a lot of fluids will help with constipation.  Make sure your family understands everything about your operation and recovery. SEEK MEDICAL CARE IF:   You notice swelling and redness around the wound area.  You notice a foul smell coming from the wound or on the surgical dressing.  You notice the wound is separating.  You have painful or bloody urination.  You develop nausea and vomiting.  You develop diarrhea.  You develop a rash.  You have a reaction or allergy from the medicine.  You feel dizzy or light-headed.  You need stronger pain medicine. SEEK IMMEDIATE MEDICAL CARE IF:   You develop a temperature of 102 F (38.9 C) or higher.  You pass out.  You develop leg or chest pain.  You develop abdominal pain.  You develop shortness of breath.  You develop bleeding from the wound area.  You see pus in the wound area. MAKE SURE YOU:   Understand these instructions.  Will watch your condition.  Will get help right away if you are not doing well or get worse. Document Released: 11/24/2003 Document Revised: 08/26/2013 Document Reviewed: 03/13/2009 [facility] Patient Information 2015 [place], [place]. This information is not intended to replace advice given to you by your health care provider. Make sure you discuss any questions you have with your health care provider. Post Anesthesia Home Care Instructions  Activity: Get plenty of rest for the remainder of the day. A responsible adult should stay with you for 24 hours following the procedure.  For the next 24 hours, DO NOT: -Drive a car -Paediatric nurse -Drink alcoholic beverages -Take any medication unless instructed by your physician -Make any legal decisions or sign important papers.  Meals: Start with  liquid foods such as gelatin or soup. Progress to regular foods as tolerated. Avoid greasy, spicy, heavy foods. If nausea and/or vomiting occur, drink only clear liquids until the nausea and/or vomiting subsides. Call your physician if vomiting continues.  Special Instructions/Symptoms: Your throat may feel dry or sore from the anesthesia or the breathing tube placed in your throat during surgery. If this causes discomfort, gargle with warm salt water. The discomfort should disappear within 24 hours.  If you had a scopolamine patch placed behind your ear for the management of post- operative nausea and/or vomiting:  1. The medication in the patch is effective for 72 hours, after which it should be removed.  Wrap patch in a tissue and discard in the trash. Wash hands thoroughly with soap and water. 2. You may remove the patch earlier than 72 hours if you experience unpleasant side effects which may include dry mouth, dizziness or visual disturbances. 3. Avoid touching the patch. Wash your hands with soap and water after contact with the patch.

## 2015-07-28 NOTE — Transfer of Care (Signed)
Immediate Anesthesia Transfer of Care Note  Patient: Briana Wiley  Procedure(s) Performed: Procedure(s) (LRB): CO2 LASER OF THE VULVA (N/A)  Patient Location: PACU  Anesthesia Type: General  Level of Consciousness: awake, sedated, patient cooperative and responds to stimulation  Airway & Oxygen Therapy: Patient Spontanous Breathing and Patient connected to face mask oxygen  Post-op Assessment: Report given to PACU RN, Post -op Vital signs reviewed and stable and Patient moving all extremities  Post vital signs: Reviewed and stable  Complications: No apparent anesthesia complications

## 2015-07-28 NOTE — Interval H&P Note (Signed)
History and Physical Interval Note:  07/28/2015 12:59 PM  Briana Wiley  has presented today for surgery, with the diagnosis of VULVA INTRAEPITHELIA NEOPLASIA III  The various methods of treatment have been discussed with the patient and family. After consideration of risks, benefits and other options for treatment, the patient has consented to  Procedure(s): CO2 LASER OF THE VULVA (N/A) as a surgical intervention .  The patient's history has been reviewed, patient examined, no change in status, stable for surgery.  I have reviewed the patient's chart and labs.  Questions were answered to the patient's satisfaction.     Donaciano Eva

## 2015-07-28 NOTE — Anesthesia Procedure Notes (Signed)
Procedure Name: LMA Insertion Date/Time: 07/28/2015 1:34 PM Performed by: Justice Rocher Pre-anesthesia Checklist: Patient identified, Emergency Drugs available, Suction available and Patient being monitored Patient Re-evaluated:Patient Re-evaluated prior to inductionOxygen Delivery Method: Circle System Utilized Preoxygenation: Pre-oxygenation with 100% oxygen Intubation Type: IV induction Ventilation: Mask ventilation without difficulty LMA: LMA inserted LMA Size: 4.0 Number of attempts: 1 Airway Equipment and Method: bite block Placement Confirmation: positive ETCO2 Tube secured with: Tape Dental Injury: Teeth and Oropharynx as per pre-operative assessment

## 2015-07-28 NOTE — Op Note (Signed)
PATIENT: Briana Wiley DATE: 07/28/15   Preop Diagnosis: VIN 3, hx of vulvar cancer  Postoperative Diagnosis: same  Surgery: Partial simple CO2 laser of vulva  Surgeons:  Donaciano Eva, MD Assistant: none  Anesthesia: General   Estimated blood loss: <10  IVF:  200  Urine output: [id number]   Complications: None   Pathology: none   Operative findings: horseshoe shaped displasia across anterior labia minora, clitoris and mid portion of labia minora. No changes posteriorally.  Procedure: The patient was identified in the preoperative holding area. Informed consent was signed on the chart. Patient was seen history was reviewed and exam was performed.   The patient was then taken to the operating room and placed in the supine position with SCD hose on. General anesthesia was then induced without difficulty. She was then placed in the dorsolithotomy position. The perineum was prepped with Betadine. The vagina was prepped with Betadine. The patient was then draped after the prep was dried. An and out Foley catheter was inserted into the bladder under sterile conditions.  Timeout was performed the patient, procedure, antibiotic, allergy, and length of procedure. 5% acetic acid solution was applied to the perineum. The vulvar tissues were inspected for areas of acetowhite changes or leukoplakia. The lesion was identified and the marking pen was used to circumscribe the area with appropriate surgical margins. The subcuticular tissues were infiltrated with 1% lidocaine. Staff had laser eyewear placed as did the patient. The patient was draped with wet towels.   The laser was set to 12 watts continuous and was applied to the entire lesion. The eschar was wiped away with a wet cloth. Silvadine cream was applied.  All instrument, suture, laparotomy, Ray-Tec, and needle counts were correct x2. The patient tolerated the procedure well and was taken recovery room in stable condition. This  is [name] dictating an operative note on [facility].

## 2015-07-28 NOTE — H&P (View-Only) (Signed)
Follow-up Note Note: Gyn-Onc  Consult was initially requested by Dr. Elonda Husky for the evaluation of Briana Wiley 45 y.o. female with squamous cell cancer of the vulva, clinical stage II   Chief Complaint  Patient presents with  . VIN III    Follow up     Assessment/Plan:  [name]  is a 45 y.o.  year old who has chronic paranoid schizophrenia, tobacco abuse and a history of clinical stage II vulvar SCC s/p primary radiation (completed February 13, 2014).  Now with diffuse VIN3 on anterior vulva and circumferential introitus. Will proceed with CO2 laser.  I discussed the procedure, its risks and anticipated recovery and recommended after-care. All questions were answered.  HPI: Briana Wiley is a 45 year old woman who was seen in consultation at the request of Dr. Elonda Husky for clinical stage II vulvar squamous cell carcinoma. 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 She also has a history significant for paranoid schizophrenia and resides in her care facility for this. She is an extremely heavy smoker and continues to smoke 1-2 packs per day. She began experiencing vulvar irritation in the past few months. She was seen by Dr. Elonda Husky for this. He recognized the posterior 5 cm raised erythematous vulva mass and performed a biopsy. Pathology revealed at least high-grade VIN associated with an area highly suspicious for squamous cell carcinoma.  PET scan was performed which revealed positive inguinal and pelvic nodes.   She was treated with primary radiation with external beam radiation (45 gray to the pelvis and 55 gray with simulated integrated boost to the pelvic nodes). The patient then received a boost to the vulva area to a cumulative dose of 59.4 gray. Treatment dates were 12/26/13-02/13/14.  She tolerated therapy well with no issues.  Interval History: She has intermittent mild vulvar pain. Biopsy of vulva on 06/24/15 showed  VIN III.  Current Meds:  Outpatient Encounter Prescriptions as of 07/15/2015  Medication Sig  . albuterol (PROVENTIL HFA;VENTOLIN HFA) 108 (90 BASE) MCG/ACT inhaler Inhale 2 puffs into the lungs every 4 (four) hours as needed for wheezing or shortness of breath.  . benzonatate (TESSALON) 100 MG capsule Take 1 capsule (100 mg total) by mouth 3 (three) times daily as needed for cough.  . benztropine (COGENTIN) 1 MG tablet Take 1 mg by mouth 2 (two) times daily.  Marland Kitchen emollient (BIAFINE) cream Apply topically as needed.  . haloperidol (HALDOL) 5 MG tablet Take 5 mg by mouth at bedtime.  . haloperidol decanoate (HALDOL DECANOATE) 100 MG/ML injection Inject into the muscle every 28 (twenty-eight) days.  Marland Kitchen ibuprofen (ADVIL,MOTRIN) 400 MG tablet Take 400 mg by mouth every 6 (six) hours as needed.  . loperamide (IMODIUM A-D) 2 MG tablet Take 2 mg by mouth 4 (four) times daily as needed for diarrhea or loose stools. Reported on 06/24/2015  . silver sulfADIAZINE (SILVADENE) 1 % cream Apply 1 application topically 2 (two) times daily. Apply to red/open areas twice a day.  Clean site with clean, wet washcloth before each application.  . solifenacin (VESICARE) 10 MG tablet Take by mouth daily.  . traZODone (DESYREL) 50 MG tablet Take 50 mg by mouth at bedtime. Takes 3 tabs at bedtime  . HYDROcodone-acetaminophen (NORCO/VICODIN) 5-325 MG tablet Take 1 tablet by mouth every 4 (four) hours as needed for moderate pain.  . naproxen (NAPROSYN) 250 MG tablet Take 1 tablet (250 mg total) by mouth 2 (  two) times daily as needed for mild pain or moderate pain (take with food).  . [DISCONTINUED] HYDROcodone-acetaminophen (NORCO/VICODIN) 5-325 MG tablet Take 1 tablet by mouth every 4 (four) hours as needed for moderate pain. (Patient not taking: Reported on 07/15/2015)  . [DISCONTINUED] naproxen (NAPROSYN) 250 MG tablet Take 1 tablet (250 mg total) by mouth 2 (two) times daily as needed for mild pain or moderate pain (take with  food). (Patient not taking: Reported on 07/15/2015)   No facility-administered encounter medications on file as of 07/15/2015.    Allergy: No Known Allergies  Social Hx:   Social History   Social History  . Marital Status: Legally Separated    Spouse Name: N/A  . Number of Children: 3  . Years of Education: N/A   Occupational History  .     Social History Main Topics  . Smoking status: Current Every Day Smoker -- 1.00 packs/day for 15 years    Types: Cigars  . Smokeless tobacco: Former Systems developer    Types: Snuff, Chew  . Alcohol Use: No  . Drug Use: No  . Sexual Activity: No   Other Topics Concern  . Not on file   Social History Narrative    Past Surgical Hx:  Past Surgical History  Procedure Laterality Date  . Tubal ligation Bilateral   . Vulva / perineum biopsy  12/02/13  . Vulvectomy partial  2003    Past Medical Hx:  Past Medical History  Diagnosis Date  . Chronic schizophrenia ([place])   . Trouble in sleeping   . Depression   . Vulvar mass 11/07/2013  . Radiation 12/26/13-02/13/14    45 gray to pelvis, pelvic nodes boosted to 55 gray, vulvar area boosted to 59.4 gray    Past Gynecological History:  G3P3, SVD x3  No LMP recorded. Patient has had an ablation.  Family Hx:  Family History  Problem Relation Age of Onset  . Seizures Mother   . Hypertension Sister     Review of Systems:  Constitutional  Feels well,    ENT Normal appearing ears and nares bilaterally Skin/Breast  No rash, sores, jaundice, itching, dryness Cardiovascular  No chest pain, shortness of breath, or edema  Pulmonary  No cough or wheeze.  Gastro Intestinal  No nausea, vomitting, or diarrhoea. No bright red blood per rectum, no abdominal pain, change in bowel movement, or constipation.  Genito Urinary  No frequency, urgency, dysuria, see HPI Musculo Skeletal  No myalgia, arthralgia, joint swelling or pain  Neurologic  No weakness, numbness, change in gait,  Psychology  No  depression, anxiety, insomnia.   Vitals:  Blood pressure 117/71, pulse 65, temperature 98.2 F (36.8 C), temperature source Oral, resp. rate 18, height 5\' 6"  (1.676 m), weight 149 lb 12.8 oz (67.949 kg), SpO2 99 %.  Physical Exam: WD in NAD Neck  Supple NROM, without any enlargements.  Lymph Node Survey No cervical supraclavicular or inguinal adenopathy Cardiovascular  Pulse normal rate, regularity and rhythm. S1 and S2 normal.  Lungs  Clear to auscultation bilateraly, without wheezes/crackles/rhonchi. Good air movement.  Skin  No rash/lesions/breakdown  Psychiatry  Alert and oriented to person, place, and time  Abdomen  Normoactive bowel sounds, abdomen soft, non-tender and thin without evidence of hernia.  Back No CVA tenderness Genito Urinary  Vulva/vagina:complete resolution of left posterior vulvar lesion. There are nodular hyperkeratotic areas of leukoplakia anteriorally on the right and circumferentially involving labia minora.   Bladder/urethra:  No lesions or masses, well  supported bladder  Vagina: distal 1cm of right vagina involved with lesion.  Cervix: Normal appearing, no lesions.  Uterus: deferred exam  Adnexa: deferred exam  Rectal  Good tone, no masses no cul de sac nodularity.  Extremities  No bilateral cyanosis, clubbing or edema.   Donaciano Eva, MD   07/15/2015, 5:13 PM

## 2015-07-29 ENCOUNTER — Encounter (HOSPITAL_BASED_OUTPATIENT_CLINIC_OR_DEPARTMENT_OTHER): Payer: Self-pay | Admitting: Gynecologic Oncology

## 2015-07-29 ENCOUNTER — Telehealth: Payer: Self-pay | Admitting: *Deleted

## 2015-07-29 NOTE — Telephone Encounter (Signed)
Left message on caretakers voicemail with future appointment details. Pt has a scheduled appointment on 08/13/2013 @ 12:00 with Dr. Denman George

## 2015-08-14 ENCOUNTER — Ambulatory Visit: Payer: Medicare Other | Attending: Gynecologic Oncology | Admitting: Gynecologic Oncology

## 2015-08-26 DIAGNOSIS — C519 Malignant neoplasm of vulva, unspecified: Secondary | ICD-10-CM | POA: Diagnosis not present

## 2015-08-26 DIAGNOSIS — F17200 Nicotine dependence, unspecified, uncomplicated: Secondary | ICD-10-CM | POA: Diagnosis not present

## 2015-08-26 DIAGNOSIS — N390 Urinary tract infection, site not specified: Secondary | ICD-10-CM | POA: Diagnosis not present

## 2015-08-26 DIAGNOSIS — F209 Schizophrenia, unspecified: Secondary | ICD-10-CM | POA: Diagnosis not present

## 2015-08-31 IMAGING — MG MM DIGITAL SCREENING
4 series · 4 of 4 positions shown · non-contrast
Comparison: Previous exam(s).

CLINICAL DATA: Screening.

EXAM:
DIGITAL SCREENING BILATERAL MAMMOGRAM WITH CAD

[L CC]
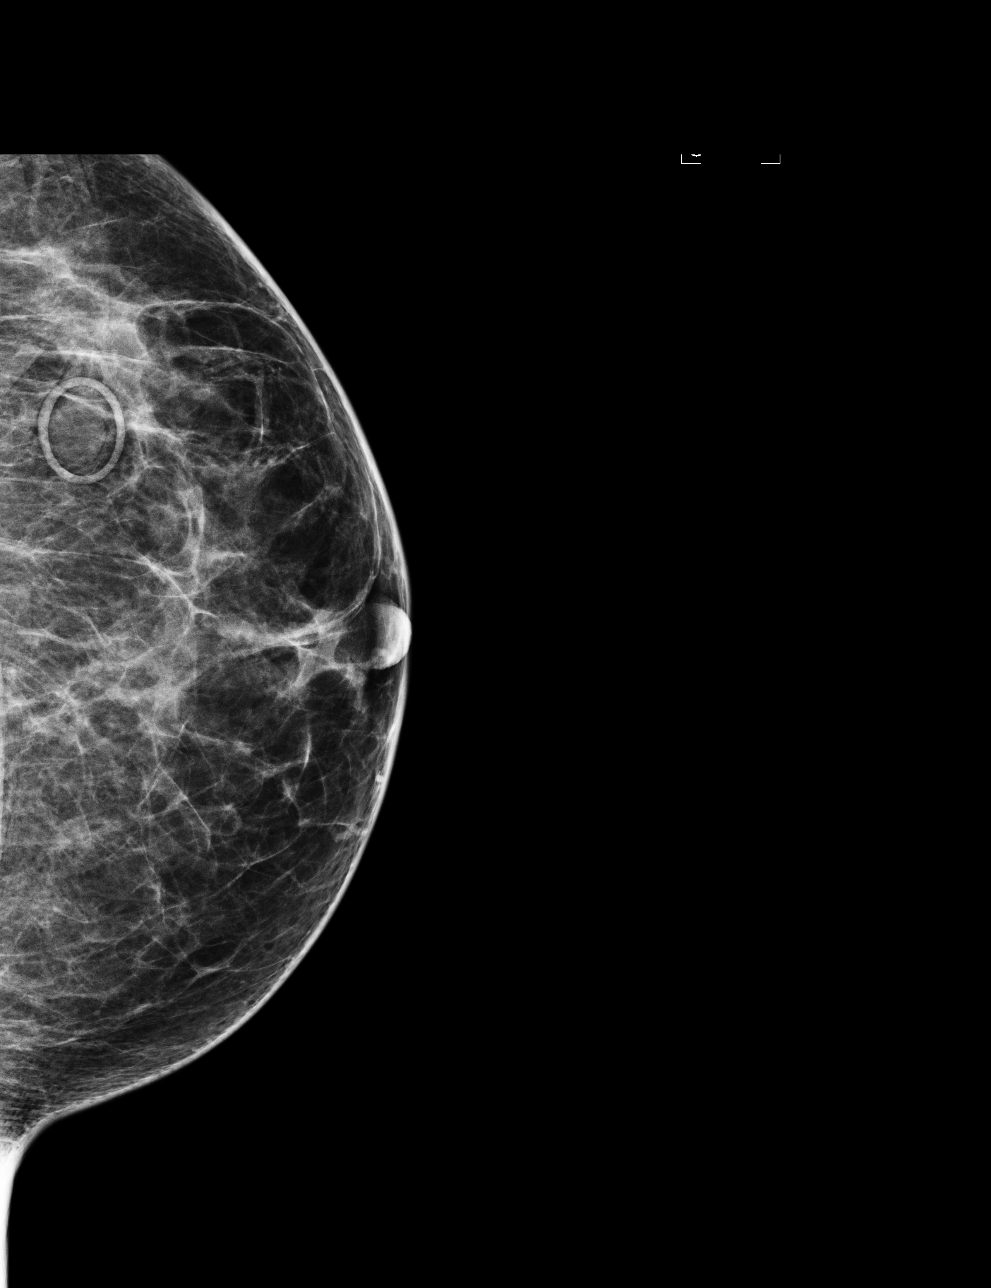

[L MLO]
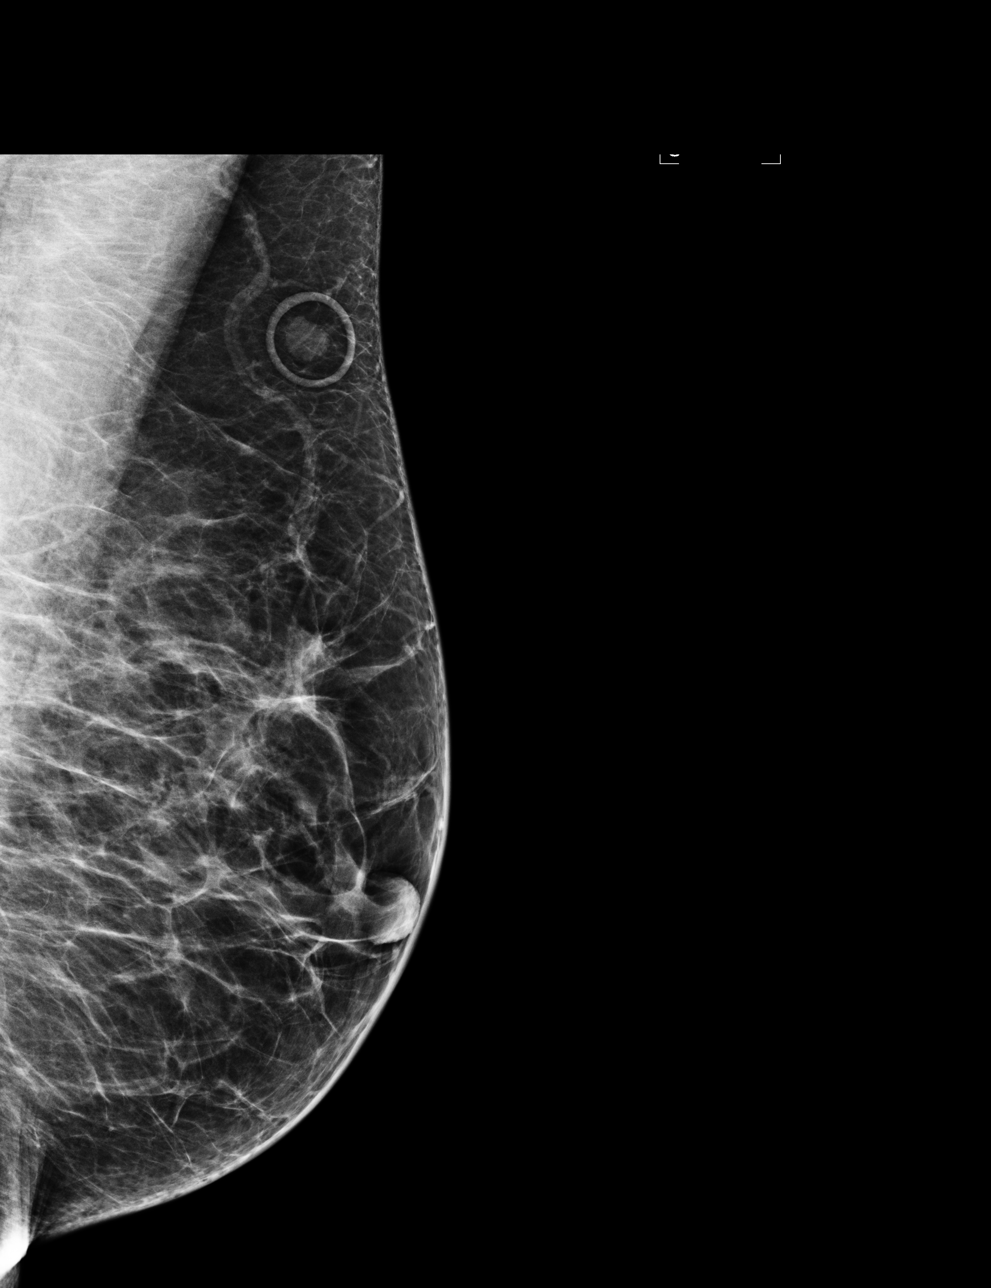

[R CC]
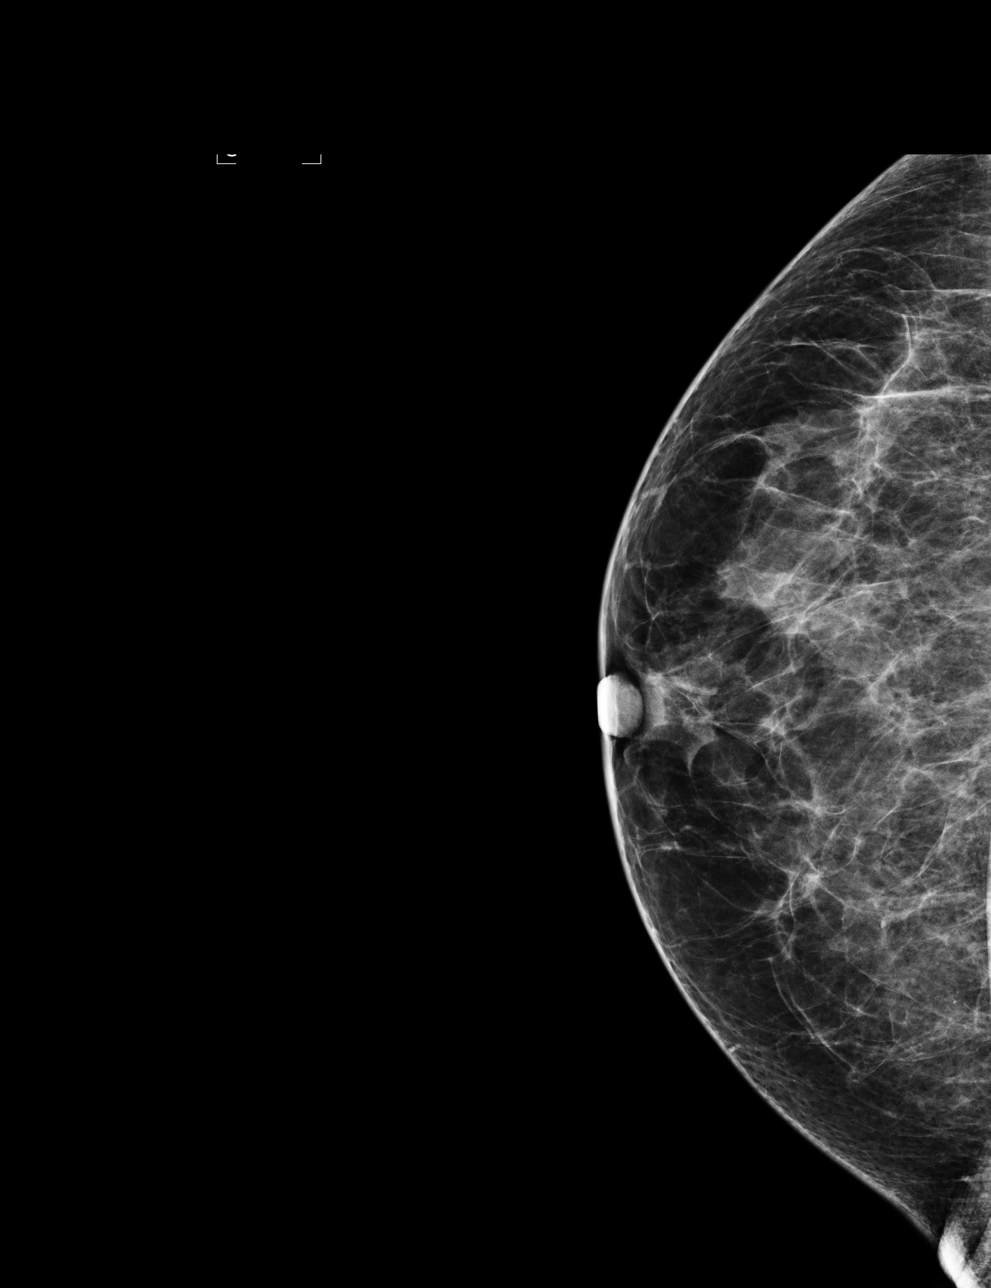

[R MLO]
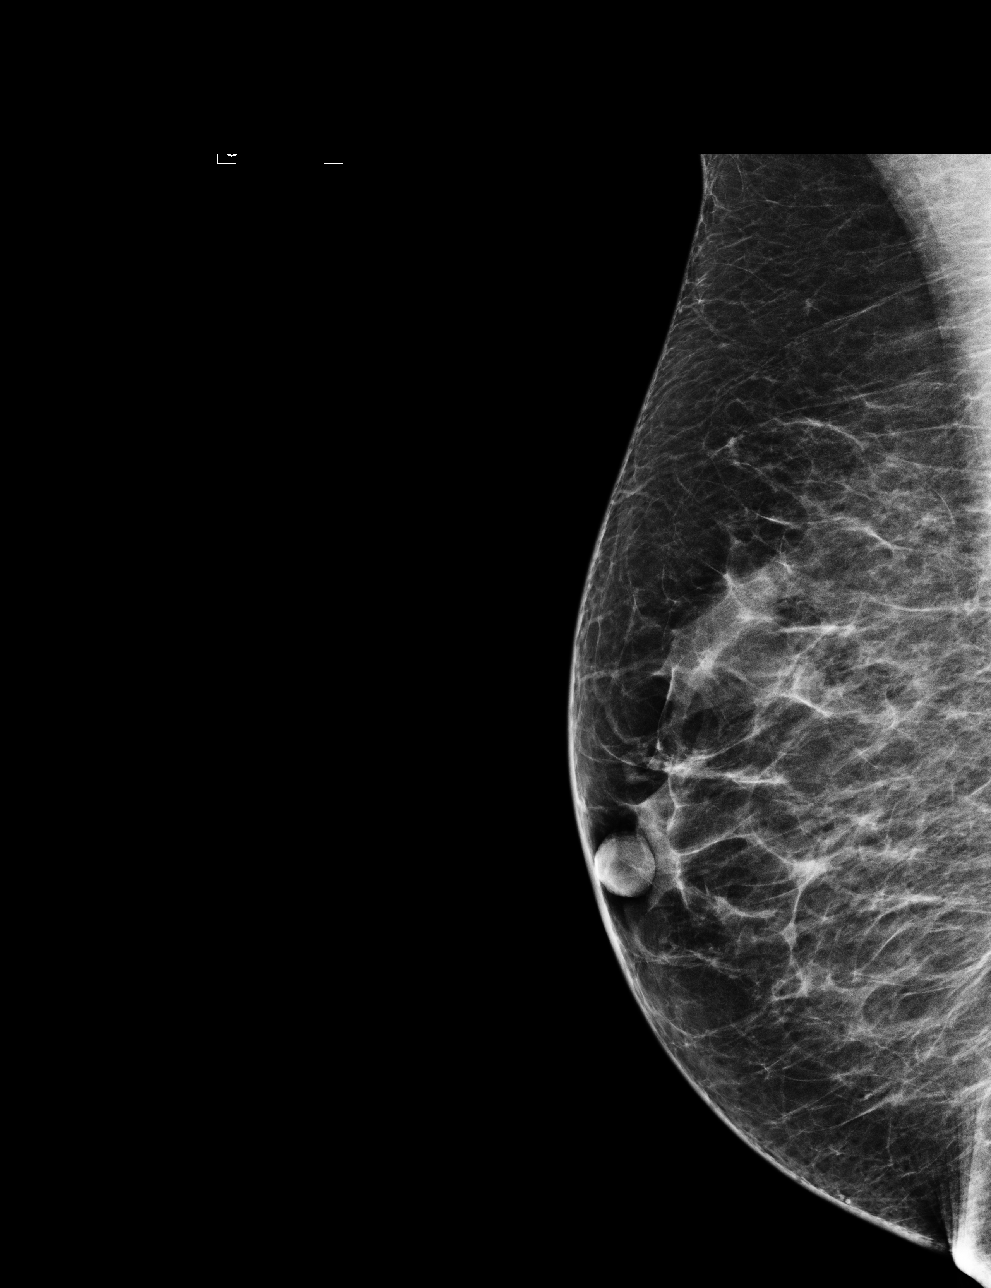

[4 of 4 positions shown; findings below may reference images not displayed]

ACR Breast Density Category b: There are scattered areas of
fibroglandular density.
FINDINGS: There are no findings suspicious for malignancy. Images were
processed with CAD.
IMPRESSION: No mammographic evidence of malignancy. A result letter of this
screening mammogram will be mailed directly to the patient.

RECOMMENDATION:
Screening mammogram in one year. (Code:AS-G-LCT)

BI-RADS CATEGORY  1: Negative.

## 2015-09-03 DIAGNOSIS — F209 Schizophrenia, unspecified: Secondary | ICD-10-CM | POA: Diagnosis not present

## 2015-09-04 ENCOUNTER — Telehealth: Payer: Self-pay | Admitting: *Deleted

## 2015-09-04 NOTE — Telephone Encounter (Signed)
Left message on voicemail requesting patient to please call GYN ONC to rescheduled her missed appointmnet.

## 2015-09-24 ENCOUNTER — Ambulatory Visit
Admission: RE | Admit: 2015-09-24 | Discharge: 2015-09-24 | Disposition: A | Discharge status: Home / Self Care | Payer: Medicare Other | Source: Ambulatory Visit | Attending: Radiation Oncology | Admitting: Radiation Oncology

## 2015-09-24 ENCOUNTER — Encounter: Payer: Self-pay | Admitting: Radiation Oncology

## 2015-09-24 VITALS — BP 133/94 | HR 63 | Temp 97.9°F | Ht 66.0 in | Wt 144.0 lb

## 2015-09-24 DIAGNOSIS — C519 Malignant neoplasm of vulva, unspecified: Secondary | ICD-10-CM | POA: Diagnosis present

## 2015-09-24 DIAGNOSIS — Z923 Personal history of irradiation: Secondary | ICD-10-CM | POA: Insufficient documentation

## 2015-09-24 NOTE — Progress Notes (Signed)
Radiation Oncology         [phone] ________________________________  Name: Briana Wiley MRN: [id number]  Date: 09/24/2015  DOB: 1970-10-06  Follow-Up Visit Note  CC: [name], MD  [name], MD  No diagnosis found.  Diagnosis:    Clinical Stage II Vulvar squamous cell carcinoma with PET scan showing inguinal and pelvic metastasis   Interval Since Last Radiation:  1 year and 7 months  September 3 through February 13, 2014: Initial treatment was 45 gray to the pelvis. The PET positive inguinal and pelvic nodes were boosted to 55 gray with a simultaneous integrated boost.   After completion of this treatment the patient proceeded with a boost to the vulvar area to a cumulative dose of 59.4 gray to this area.  Narrative:  Briana Wiley presents to clinic for a routine follow-up appointment. She denies pain today, but has occasional lower abdominal pain. She takes norco as needed. She denies having any bladder issues/bowel issues or vaginal/rectal bleeding or discharge. She reports her energy level is low. She said she had a CO2 laser of the vulva in April. She reports some bleeding after the surgery.  ALLERGIES:  has No Known Allergies.  Meds: Current Outpatient Prescriptions  Medication Sig Dispense Refill  . albuterol (PROVENTIL HFA;VENTOLIN HFA) 108 (90 BASE) MCG/ACT inhaler Inhale 2 puffs into the lungs every 4 (four) hours as needed for wheezing or shortness of breath. 1 Inhaler 0  . benzonatate (TESSALON) 100 MG capsule Take 1 capsule (100 mg total) by mouth 3 (three) times daily as needed for cough. 15 capsule 0  . benztropine (COGENTIN) 1 MG tablet Take 1 mg by mouth 2 (two) times daily.    . haloperidol (HALDOL) 5 MG tablet Take 5 mg by mouth at bedtime.    . haloperidol decanoate (HALDOL DECANOATE) 100 MG/ML injection Inject into the muscle every 28 (twenty-eight) days.    Marland Kitchen HYDROcodone-acetaminophen (NORCO/VICODIN) 5-325 MG tablet Take 1 tablet by mouth  every 4 (four) hours as needed for moderate pain. 30 tablet 0  . ibuprofen (ADVIL,MOTRIN) 400 MG tablet Take 400 mg by mouth every 6 (six) hours as needed.    . naproxen (NAPROSYN) 250 MG tablet Take 1 tablet (250 mg total) by mouth 2 (two) times daily as needed for mild pain or moderate pain (take with food). 14 tablet 0  . solifenacin (VESICARE) 10 MG tablet Take by mouth daily.    . traZODone (DESYREL) 50 MG tablet Take 50 mg by mouth at bedtime. Takes 3 tabs at bedtime    . loperamide (IMODIUM A-D) 2 MG tablet Take 2 mg by mouth 4 (four) times daily as needed for diarrhea or loose stools. Reported on 09/24/2015    . oxyCODONE-acetaminophen (PERCOCET) 10-325 MG tablet Take 1 tablet by mouth every 4 (four) hours as needed for pain. (Patient not taking: Reported on 09/24/2015) 30 tablet 0  . silver sulfADIAZINE (SILVADENE) 1 % cream Apply 1 application topically 2 (two) times daily. Reported on 09/24/2015     No current facility-administered medications for this encounter.    Physical Findings: The patient is in no acute distress. Patient is alert and oriented.  height is 5\' 6"  (1.676 m) and weight is 144 lb (65.318 kg). Her oral temperature is 97.9 F (36.6 C). Her blood pressure is 133/94 and her pulse is 63. Her oxygen saturation is 99%.   Lungs are clear to auscultation bilaterally. Heart has regular rate and rhythm. No palpable cervical, supraclavicular, or  axillary adenopathy. Abdomen soft, non-tender, normal bowel sounds. No palpable inguinal adenopathy. Healing well from her recent laser surgery. No obvious lesions along the perineum. Speculum exam shows no mucosal lesions in the vaginal vault. No palpable masses on bimanual exam. A small non-thrombosed external hemorrhoid on rectal exam.  Lab Findings: Lab Results  Component Value Date   WBC 4.8 04/01/2015   HGB 14.8 07/28/2015   HCT 44.0 04/01/2015   MCV 93.0 04/01/2015   PLT 134* 04/01/2015    Radiographic Findings: No results  found.  Impression: No evidence of recurrence on clinical exam today. No Pap smear was obtained during this encounter.  Plan: She will follow up in 6 months. She will follow up with Dr. Denman George in 2-3 months. ____________________________________   Blair Promise, PhD, MD  This document serves as a record of services personally performed by Gery Pray, MD. It was created on his behalf by Darcus Austin, a trained medical scribe. The creation of this record is based on the scribe's personal observations and the provider's statements to them. This document has been checked and approved by the attending provider.

## 2015-09-24 NOTE — Progress Notes (Addendum)
Briana Wiley here for follow up.  She denies pain today but has occasional lower abdominal pain.  She takes norco as needed.  She denies having any bladder issues/bowel issues and vaginal/rectal bleeding or discharge.  She reports her energy level is low.  She said she had a CO2 laser application on [id number].  BP 133/94 mmHg  Pulse 63  Temp(Src) 97.9 F (36.6 C) (Oral)  Ht 5\' 6"  (1.676 m)  Wt 144 lb (65.318 kg)  BMI 23.25 kg/m2  SpO2 99%   Wt Readings from Last 3 Encounters:  09/24/15 144 lb (65.318 kg)  07/28/15 147 lb (66.679 kg)  07/15/15 149 lb 12.8 oz (67.949 kg)

## 2015-09-25 NOTE — Addendum Note (Signed)
Encounter addended by: Jacqulyn Liner, RN on: 09/25/2015  1:14 PM<BR>     Documentation filed: Charges VN

## 2015-11-06 ENCOUNTER — Other Ambulatory Visit (HOSPITAL_COMMUNITY): Payer: Self-pay | Admitting: Respiratory Therapy

## 2015-11-06 DIAGNOSIS — J449 Chronic obstructive pulmonary disease, unspecified: Secondary | ICD-10-CM

## 2015-11-06 DIAGNOSIS — F17200 Nicotine dependence, unspecified, uncomplicated: Secondary | ICD-10-CM | POA: Diagnosis not present

## 2015-11-06 DIAGNOSIS — F209 Schizophrenia, unspecified: Secondary | ICD-10-CM | POA: Diagnosis not present

## 2015-11-18 ENCOUNTER — Ambulatory Visit (HOSPITAL_COMMUNITY)
Admission: RE | Admit: 2015-11-18 | Discharge: 2015-11-18 | Disposition: A | Discharge status: Home / Self Care | Payer: Medicare Other | Source: Ambulatory Visit | Attending: Internal Medicine | Admitting: Internal Medicine

## 2015-11-18 DIAGNOSIS — J449 Chronic obstructive pulmonary disease, unspecified: Secondary | ICD-10-CM | POA: Diagnosis not present

## 2015-11-18 LAB — SPIROMETRY WITH GRAPH
FEF 25-75 Pre: 2.94 L/sec
FEF2575-%PRED-PRE: 105 %
FEV1-%PRED-PRE: 95 %
FEV1-PRE: 2.59 L
FEV1FVC-%PRED-PRE: 104 %
FEV6-%Pred-Pre: 92 %
FEV6-PRE: 3.05 L
FEV6FVC-%Pred-Pre: 102 %
FVC-%Pred-Pre: 90 %
FVC-Pre: 3.05 L
Pre FEV1/FVC ratio: 85 %
Pre FEV6/FVC Ratio: 100 %

## 2015-12-23 DIAGNOSIS — F209 Schizophrenia, unspecified: Secondary | ICD-10-CM | POA: Diagnosis not present

## 2016-03-22 DIAGNOSIS — Z23 Encounter for immunization: Secondary | ICD-10-CM | POA: Diagnosis not present

## 2016-03-22 DIAGNOSIS — Z0000 Encounter for general adult medical examination without abnormal findings: Secondary | ICD-10-CM | POA: Diagnosis not present

## 2016-03-22 DIAGNOSIS — J410 Simple chronic bronchitis: Secondary | ICD-10-CM | POA: Diagnosis not present

## 2016-03-22 DIAGNOSIS — E785 Hyperlipidemia, unspecified: Secondary | ICD-10-CM | POA: Diagnosis not present

## 2016-03-22 DIAGNOSIS — F209 Schizophrenia, unspecified: Secondary | ICD-10-CM | POA: Diagnosis not present

## 2016-03-22 DIAGNOSIS — F17200 Nicotine dependence, unspecified, uncomplicated: Secondary | ICD-10-CM | POA: Diagnosis not present

## 2016-03-22 DIAGNOSIS — R32 Unspecified urinary incontinence: Secondary | ICD-10-CM | POA: Diagnosis not present

## 2016-03-22 DIAGNOSIS — J309 Allergic rhinitis, unspecified: Secondary | ICD-10-CM | POA: Diagnosis not present

## 2016-04-07 ENCOUNTER — Ambulatory Visit: Payer: Medicare Other | Admitting: Radiation Oncology

## 2016-04-11 ENCOUNTER — Encounter: Payer: Self-pay | Admitting: Radiation Oncology

## 2016-04-11 ENCOUNTER — Ambulatory Visit
Admission: RE | Admit: 2016-04-11 | Discharge: 2016-04-11 | Disposition: A | Discharge status: Home / Self Care | Payer: Medicare Other | Source: Ambulatory Visit | Attending: Radiation Oncology | Admitting: Radiation Oncology

## 2016-04-11 ENCOUNTER — Other Ambulatory Visit: Payer: Self-pay | Admitting: Gynecologic Oncology

## 2016-04-11 VITALS — BP 132/84 | HR 68 | Temp 97.6°F | Ht 66.0 in | Wt 143.8 lb

## 2016-04-11 DIAGNOSIS — D071 Carcinoma in situ of vulva: Secondary | ICD-10-CM

## 2016-04-11 DIAGNOSIS — N9089 Other specified noninflammatory disorders of vulva and perineum: Secondary | ICD-10-CM | POA: Diagnosis not present

## 2016-04-11 DIAGNOSIS — C519 Malignant neoplasm of vulva, unspecified: Secondary | ICD-10-CM | POA: Diagnosis not present

## 2016-04-11 DIAGNOSIS — K625 Hemorrhage of anus and rectum: Secondary | ICD-10-CM | POA: Insufficient documentation

## 2016-04-11 DIAGNOSIS — Z79899 Other long term (current) drug therapy: Secondary | ICD-10-CM | POA: Diagnosis not present

## 2016-04-11 DIAGNOSIS — Z8544 Personal history of malignant neoplasm of other female genital organs: Secondary | ICD-10-CM | POA: Diagnosis not present

## 2016-04-11 DIAGNOSIS — Z08 Encounter for follow-up examination after completed treatment for malignant neoplasm: Secondary | ICD-10-CM | POA: Diagnosis not present

## 2016-04-11 NOTE — Progress Notes (Signed)
Briana Wiley is here for follow up.  She reports having occasional pain in her vaginal area.  She reports that she has a boil in her vaginal area that started last week.  She reports having a small amount of vaginal bleeding "every now and then."  She denies having any bowel issues.  She reports having a good energy level.  BP 132/84 (BP Location: Right Arm, Patient Position: Sitting)   Pulse 68   Temp 97.6 F (36.4 C) (Oral)   Ht 5\' 6"  (1.676 m)   Wt 143 lb 12.8 oz (65.2 kg)   SpO2 100%   BMI 23.21 kg/m    Wt Readings from Last 3 Encounters:  04/11/16 143 lb 12.8 oz (65.2 kg)  09/24/15 144 lb (65.3 kg)  07/28/15 147 lb (66.7 kg)

## 2016-04-11 NOTE — Progress Notes (Signed)
Radiation Oncology         [phone] ________________________________  Name: Briana Wiley MRN: [id number]  Date: 04/11/2016  DOB: 02-19-1971  Follow-Up Visit Note  CC: [name], MD  [name], MD    ICD-9-CM ICD-10-CM   1. Vulvar cancer ([place]) 184.4 C51.9   2. 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 The PET positive inguinal and pelvic nodes were boosted to 55 gray with a simultaneous integrated boost.   After completion of this treatment the patient proceeded with a boost to the vulvar area to a cumulative dose of 59.4 gray to this area.  Narrative:  Briana Wiley presents to clinic for a routine follow-up appointment. She reports occasional pain in her vaginal area. Patient denies taking pain medication. She reports a boil in her vaginal area that started last week. She reports a small amount of vaginal bleeding intermittently. Patient reports "spots" of rectal bleeding. She denies any other bowel problems. Patient states a good energy level.  ALLERGIES:  is allergic to chicken meat (diagnostic).  Meds: Current Outpatient Prescriptions  Medication Sig Dispense Refill  . albuterol (PROVENTIL HFA;VENTOLIN HFA) 108 (90 BASE) MCG/ACT inhaler Inhale 2 puffs into the lungs every 4 (four) hours as needed for wheezing or shortness of breath. 1 Inhaler 0  . benztropine (COGENTIN) 1 MG tablet Take 1 mg by mouth 2 (two) times daily.    . haloperidol (HALDOL) 5 MG tablet Take 5 mg by mouth at bedtime.    . haloperidol decanoate (HALDOL DECANOATE) 100 MG/ML injection Inject into the muscle every 28 (twenty-eight) days.    Marland Kitchen ibuprofen (ADVIL,MOTRIN) 400 MG tablet Take 400 mg by mouth every 6 (six) hours as needed.    .  solifenacin (VESICARE) 10 MG tablet Take by mouth daily.    . traZODone (DESYREL) 50 MG tablet Take 50 mg by mouth at bedtime. Takes 3 tabs at bedtime    . benzonatate (TESSALON) 100 MG capsule Take 1 capsule (100 mg total) by mouth 3 (three) times daily as needed for cough. (Patient not taking: Reported on 04/11/2016) 15 capsule 0  . HYDROcodone-acetaminophen (NORCO/VICODIN) 5-325 MG tablet Take 1 tablet by mouth every 4 (four) hours as needed for moderate pain. (Patient not taking: Reported on 04/11/2016) 30 tablet 0  . loperamide (IMODIUM A-D) 2 MG tablet Take 2 mg by mouth 4 (four) times daily as needed for diarrhea or loose stools. Reported on 09/24/2015    . naproxen (NAPROSYN) 250 MG tablet Take 1 tablet (250 mg total) by mouth 2 (two) times daily as needed for mild pain or moderate pain (take with food). (Patient not taking: Reported on 04/11/2016) 14 tablet 0  . oxyCODONE-acetaminophen (PERCOCET) 10-325 MG tablet Take 1 tablet by mouth every 4 (four) hours as needed for pain. (Patient not taking: Reported on 04/11/2016) 30 tablet 0  . silver sulfADIAZINE (SILVADENE) 1 % cream Apply 1 application topically 2 (two) times daily. Reported on 09/24/2015     No current facility-administered medications for this encounter.     Physical Findings: The patient is in no acute distress. Patient is alert and oriented.  height is 5\' 6"  (1.676 m) and weight is 143 lb 12.8 oz (65.2 kg). Her oral  temperature is 97.6 F (36.4 C). Her blood pressure is 132/84 and her pulse is 68. Her oxygen saturation is 100%.   Lungs are clear to auscultation bilaterally. Heart has regular rate and rhythm. No palpable cervical, supraclavicular, or axillary adenopathy. Abdomen soft, non-tender, normal bowel sounds. No palpable inguinal adenopathy.  . The patient has a circular crater type lesion along the anterior right labia majora/ minora that measures approximately 1.5 cm in size. Speculum exam shows no mucosal lesions in the  vaginal vault. The cervix is somewhat erythematous.  No palpable masses on bimanual exam. The patient was not be able to undergo a rectal exam in light of her hemorrhoids. Lab Findings: Lab Results  Component Value Date   WBC 4.8 04/01/2015   HGB 14.8 07/28/2015   HCT 44.0 04/01/2015   MCV 93.0 04/01/2015   PLT 134 (L) 04/01/2015    Radiographic Findings: No results found.  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 Exam is concerning for possible recurrence. The patient will be set up for a consultation with gynecologic oncology in the near future. The patient was given an appointment card for Dr. Alycia Rossetti on 04/28/15 today.   Plan: Patient will follow up in radiation oncology 3 months. This appointment may change after the appointment with gynecologic oncology. She will follow up with Dr. Alycia Rossetti on 04/28/15 at 1:15 pm. ____________________________________   Blair Promise, PhD, MD  This document serves as a record of services personally performed by Gery Pray, MD. It was created on his behalf by Bethann Humble, a trained medical scribe. The creation of this record is based on the scribe's personal observations and the provider's statements to them. This document has been checked and approved by the attending provider.

## 2016-04-13 DIAGNOSIS — F209 Schizophrenia, unspecified: Secondary | ICD-10-CM | POA: Diagnosis not present

## 2016-04-27 ENCOUNTER — Other Ambulatory Visit: Payer: Self-pay | Admitting: Gynecologic Oncology

## 2016-04-27 ENCOUNTER — Encounter: Payer: Self-pay | Admitting: Gynecologic Oncology

## 2016-04-27 ENCOUNTER — Ambulatory Visit: Payer: Medicare Other | Attending: Gynecologic Oncology | Admitting: Gynecologic Oncology

## 2016-04-27 VITALS — BP 135/77 | HR 80 | Ht 66.0 in | Wt 141.0 lb

## 2016-04-27 DIAGNOSIS — D071 Carcinoma in situ of vulva: Secondary | ICD-10-CM | POA: Diagnosis not present

## 2016-04-27 DIAGNOSIS — Z8544 Personal history of malignant neoplasm of other female genital organs: Secondary | ICD-10-CM

## 2016-04-27 DIAGNOSIS — F17210 Nicotine dependence, cigarettes, uncomplicated: Secondary | ICD-10-CM | POA: Insufficient documentation

## 2016-04-27 DIAGNOSIS — F200 Paranoid schizophrenia: Secondary | ICD-10-CM | POA: Diagnosis not present

## 2016-04-27 DIAGNOSIS — Z79899 Other long term (current) drug therapy: Secondary | ICD-10-CM | POA: Diagnosis not present

## 2016-04-27 DIAGNOSIS — C519 Malignant neoplasm of vulva, unspecified: Secondary | ICD-10-CM

## 2016-04-27 DIAGNOSIS — Z923 Personal history of irradiation: Secondary | ICD-10-CM | POA: Diagnosis not present

## 2016-04-27 NOTE — Progress Notes (Signed)
Follow-up Note Note: Gyn-Onc  Consult was initially requested by Dr. Elonda Wiley for the evaluation of Briana Wiley 46 y.o. female with squamous cell cancer of the vulva, clinical stage II.   Chief Complaint  Patient presents with  . vin III     Assessment/Plan:  [name]  is a 46 y.o.  year old who has chronic paranoid schizophrenia, tobacco abuse and a history of clinical stage II vulvar SCC s/p primary radiation (completed February 13, 2014). She then had recurrent vulvar dysplasia and underwent CO2 laser ablation with Dr. Denman Wiley in April of this year. She was mostly recently seen by Dr. Sondra Wiley in December and a new lesion was identified on the vulva.  I believe that this represents recurrence of her vulvar cancer. We sent for biopsy and will call her at her facility with the results. Should this represent a recurrence I do believe it can be excised primarily to achieve negative margins.   HPI: Briana Wiley is a 46 year old woman who was seen in consultation at the request of Dr. Elonda Wiley for clinical stage II vulvar squamous cell carcinoma. 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 She also has a history significant for paranoid schizophrenia and resides in her care facility for this. She is an extremely heavy smoker and continues to smoke 1-2 packs per day. She began experiencing vulvar irritation in the past few months. She was seen by Dr. Elonda Wiley for this. He recognized the posterior 5 cm raised erythematous vulva mass and performed a biopsy. Pathology revealed at least high-grade VIN associated with an area highly suspicious for squamous cell carcinoma.  PET scan was performed which revealed positive inguinal and pelvic nodes.   She was treated with primary radiation with external beam radiation (45 gray to the pelvis and 55 gray with simulated integrated boost to the pelvic nodes). The patient then received a boost to the vulva  area to a cumulative dose of 59.4 gray. Treatment dates were 12/26/13-02/13/14.  She tolerated therapy well with no issues.  Interval History: She has intermittent mild vulvar pain. Biopsy of vulva on 06/24/15 showed VIN III and she underwent CO2 laser with Dr. Denman Wiley in April 2017. That was last time that she was seen by our service. She was seen by radiation oncology in December was noted to have a 1.5 cm ulcerative lesion on the labia and was subsequently sent in to see [place] today. She states that she believes that this lesion is a portal and a while was hurting and having some bleeding earlier is no longer hurting and she is no longer having any bleeding.  Current Meds:  Outpatient Encounter Prescriptions as of 04/27/2016  Medication Sig  . albuterol (PROVENTIL HFA;VENTOLIN HFA) 108 (90 BASE) MCG/ACT inhaler Inhale 2 puffs into the lungs every 4 (four) hours as needed for wheezing or shortness of breath.  . benzonatate (TESSALON) 100 MG capsule Take 1 capsule (100 mg total) by mouth 3 (three) times daily as needed for cough.  . benztropine (COGENTIN) 1 MG tablet Take 1 mg by mouth 2 (two) times daily.  . haloperidol (HALDOL) 5 MG tablet Take 5 mg by mouth at bedtime.  . haloperidol decanoate (HALDOL DECANOATE) 100 MG/ML injection Inject into the muscle every 28 (twenty-eight) days.  Marland Kitchen HYDROcodone-acetaminophen (NORCO/VICODIN) 5-325 MG tablet Take 1 tablet by mouth every 4 (four) hours as needed for moderate pain.  Marland Kitchen ibuprofen (ADVIL,MOTRIN) 400 MG  tablet Take 400 mg by mouth every 6 (six) hours as needed.  . loperamide (IMODIUM A-D) 2 MG tablet Take 2 mg by mouth 4 (four) times daily as needed for diarrhea or loose stools. Reported on 09/24/2015  . naproxen (NAPROSYN) 250 MG tablet Take 1 tablet (250 mg total) by mouth 2 (two) times daily as needed for mild pain or moderate pain (take with food).  Marland Kitchen oxyCODONE-acetaminophen (PERCOCET) 10-325 MG tablet Take 1 tablet by mouth every 4 (four) hours as needed  for pain.  . silver sulfADIAZINE (SILVADENE) 1 % cream Apply 1 application topically 2 (two) times daily. Reported on 09/24/2015  . solifenacin (VESICARE) 10 MG tablet Take by mouth daily.  . traZODone (DESYREL) 50 MG tablet Take 50 mg by mouth at bedtime. Takes 3 tabs at bedtime   No facility-administered encounter medications on file as of 04/27/2016.     Allergy:  Allergies  Allergen Reactions  . Chicken Meat (Diagnostic) Nausea Only    Social Hx:   Social History   Social History  . Marital status: Legally Separated    Spouse name: N/A  . Number of children: 3  . Years of education: N/A   Occupational History  .  Unemployed   Social History Main Topics  . Smoking status: Current Every Day Smoker    Packs/day: 1.00    Years: 15.00    Types: Cigars, Cigarettes  . Smokeless tobacco: Former Systems developer    Types: Snuff, Chew  . Alcohol use Not on file  . Drug use: Unknown  . Sexual activity: No   Other Topics Concern  . Not on file   Social History Narrative  . No narrative on file    Past Surgical Hx:  Past Surgical History:  Procedure Laterality Date  . CO2 LASER APPLICATION N/A [id number]   Procedure: CO2 LASER OF THE VULVA;  Surgeon: Briana Amber, MD;  Location: [facility];  Service: Gynecology;  Laterality: N/A;  . DILATION AND CURETTAGE OF UTERUS  2009   w hysteroscopy/ ablation  . TUBAL LIGATION Bilateral   . VULVA / PERINEUM BIOPSY  12/02/13  . VULVECTOMY PARTIAL  2003    Past Medical Hx:  Past Medical History:  Diagnosis Date  . Chronic schizophrenia ([place])   . Depression   . Radiation 12/26/13-02/13/14   45 gray to pelvis, pelvic nodes boosted to 55 gray, vulvar area boosted to 59.4 gray  . Trouble in sleeping   . Vulvar mass 11/07/2013  . Wears partial dentures     Past Gynecological History:  G3P3, SVD x3  No LMP recorded. Patient has had an ablation.  Family Hx:  Family History  Problem Relation Age of Onset  . Seizures Mother   .  Hypertension Sister    Vitals:  Blood pressure 135/77, pulse 80, height 5\' 6"  (1.676 m), weight 141 lb (64 kg).  Physical Exam: WD in NAD  Groins: Negative for lymphadenopathy.  Genito Urinary  Vulva/vagina:There are nodular hyperkeratotic areas of leukoplakia on the right towards the vagina. There is a 2 x1.5 cm raised lesion on the right vulva. After obtaining the patient's verbal consent to 1.5 x 2 cm lesion was cleansed with Betadine. 2 mils of 1% lidocaine was injected. A punch biopsy was performed. Hemostasis obtained with silver nitrate. The patient tolerated the procedure well. Pathology was sent.      Mirella Gueye A., MD   04/27/2016, 4:00 PM

## 2016-04-27 NOTE — Patient Instructions (Signed)
We will call you with the results of your biopsy. Please call our office with any questions or concerns.

## 2016-04-29 ENCOUNTER — Ambulatory Visit: Payer: Medicare Other | Admitting: Gynecology

## 2016-05-12 NOTE — Progress Notes (Deleted)
Follow-up Note Note: Gyn-Onc  Consult was initially requested by Dr. Elonda Husky for the evaluation of Briana Wiley 46 y.o. 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   No chief complaint on file.    Assessment/Plan:  [name]  is a 46 y.o.  year old who has chronic paranoid schizophrenia, tobacco abuse and recurrent vulvar SCC s/p primary radiation (primary therapy completed February 13, 2014).  New recurrence of at least VIN3. Recommend surgical excision with simple partial vulvectomy.   We discussed the surgery, its complications and risk profile and anticipated recovery. I discussed that infection and wound non-healing risk are substantially higher in patients with prior radiation and tobacco abuse.  She is not a candidate for additional radiation to the area.   HPI: Briana Wiley is a 46 year old woman who was seen in consultation at the request of Dr. Elonda Husky for clinical stage II vulvar squamous cell carcinoma. 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 She also has a history significant for paranoid schizophrenia and resides in her care facility for this. She is an extremely heavy smoker and continues to smoke 1-2 packs per day. She began experiencing vulvar irritation in the past few months. She was seen by Dr. Elonda Husky for this. He recognized the posterior 5 cm raised erythematous vulva mass and performed a biopsy. Pathology revealed at least high-grade VIN associated with an area highly suspicious for squamous cell carcinoma.  PET scan was performed which revealed positive inguinal and pelvic nodes.   She was treated with primary radiation with external beam radiation (45 gray to the pelvis and 55 gray with simulated integrated boost to the pelvic nodes). The patient then received a boost to the vulva area to a cumulative dose of 59.4 gray. Treatment dates were 12/26/13-02/13/14.  She tolerated therapy well with  no issues.  Interval History: She has intermittent mild vulvar pain. Biopsy of vulva on 06/24/15 showed VIN III and she underwent CO2 laser with Dr. Denman George in April 2017. That was last time that she was seen by our service. She was seen by radiation oncology in December was noted to have a 1.5 cm ulcerative lesion on the labia and was subsequently sent in to see [place] today. She states that she believes that this lesion is a portal and a while was hurting and having some bleeding earlier is no longer hurting and she is no longer having any bleeding.  Biopsy on 04/27/16 with Dr Alycia Rossetti showed at least [place].   Current Meds:  Outpatient Encounter Prescriptions as of 05/13/2016  Medication Sig  . albuterol (PROVENTIL HFA;VENTOLIN HFA) 108 (90 BASE) MCG/ACT inhaler Inhale 2 puffs into the lungs every 4 (four) hours as needed for wheezing or shortness of breath.  . benzonatate (TESSALON) 100 MG capsule Take 1 capsule (100 mg total) by mouth 3 (three) times daily as needed for cough.  . benztropine (COGENTIN) 1 MG tablet Take 1 mg by mouth 2 (two) times daily.  . haloperidol (HALDOL) 5 MG tablet Take 5 mg by mouth at bedtime.  . haloperidol decanoate (HALDOL DECANOATE) 100 MG/ML injection Inject into the muscle every 28 (twenty-eight) days.  Marland Kitchen HYDROcodone-acetaminophen (NORCO/VICODIN) 5-325 MG tablet Take 1 tablet by mouth every 4 (four) hours as needed for moderate pain.  Marland Kitchen ibuprofen (ADVIL,MOTRIN) 400 MG tablet Take 400 mg by mouth every 6 (six) hours as needed.  . loperamide (IMODIUM A-D) 2 MG tablet  Take 2 mg by mouth 4 (four) times daily as needed for diarrhea or loose stools. Reported on 09/24/2015  . naproxen (NAPROSYN) 250 MG tablet Take 1 tablet (250 mg total) by mouth 2 (two) times daily as needed for mild pain or moderate pain (take with food).  Marland Kitchen oxyCODONE-acetaminophen (PERCOCET) 10-325 MG tablet Take 1 tablet by mouth every 4 (four) hours as needed for pain.  . silver sulfADIAZINE (SILVADENE) 1 % cream  Apply 1 application topically 2 (two) times daily. Reported on 09/24/2015  . solifenacin (VESICARE) 10 MG tablet Take by mouth daily.  . traZODone (DESYREL) 50 MG tablet Take 50 mg by mouth at bedtime. Takes 3 tabs at bedtime   No facility-administered encounter medications on file as of 05/13/2016.     Allergy:  Allergies  Allergen Reactions  . Chicken Meat (Diagnostic) Nausea Only    Social Hx:   Social History   Social History  . Marital status: Legally Separated    Spouse name: N/A  . Number of children: 3  . Years of education: N/A   Occupational History  .  Unemployed   Social History Main Topics  . Smoking status: Current Every Day Smoker    Packs/day: 1.00    Years: 15.00    Types: Cigars, Cigarettes  . Smokeless tobacco: Former Systems developer    Types: Snuff, Chew  . Alcohol use Not on file  . Drug use: Unknown  . Sexual activity: No   Other Topics Concern  . Not on file   Social History Narrative  . No narrative on file    Past Surgical Hx:  Past Surgical History:  Procedure Laterality Date  . CO2 LASER APPLICATION N/A [id number]   Procedure: CO2 LASER OF THE VULVA;  Surgeon: [name], MD;  Location: [facility];  Service: Gynecology;  Laterality: N/A;  . DILATION AND CURETTAGE OF UTERUS  2009   w hysteroscopy/ ablation  . TUBAL LIGATION Bilateral   . VULVA / PERINEUM BIOPSY  12/02/13  . VULVECTOMY PARTIAL  2003    Past Medical Hx:  Past Medical History:  Diagnosis Date  . Chronic schizophrenia ([place])   . Depression   . Radiation 12/26/13-02/13/14   45 gray to pelvis, pelvic nodes boosted to 55 gray, vulvar area boosted to 59.4 gray  . Trouble in sleeping   . Vulvar mass 11/07/2013  . Wears partial dentures     Past Gynecological History:  G3P3, SVD x3  No LMP recorded. Patient has had an ablation.  Family Hx:  Family History  Problem Relation Age of Onset  . Seizures Mother   . Hypertension Sister    Vitals:  There were no vitals  taken for this visit.  Physical Exam: WD in NAD  Groins: Negative for lymphadenopathy.  Genito Urinary  Vulva/vagina:There are nodular hyperkeratotic areas of leukoplakia on the right towards the vagina. There is a 2 x1.5 cm raised lesion on the right vulva.       Donaciano Eva, MD   05/12/2016, 2:45 PM

## 2016-05-13 ENCOUNTER — Telehealth: Payer: Self-pay | Admitting: Gynecologic Oncology

## 2016-05-13 ENCOUNTER — Ambulatory Visit: Payer: Medicare Other | Admitting: Gynecologic Oncology

## 2016-05-13 NOTE — Telephone Encounter (Signed)
Patient is not able to make the 1:15 appointment today and needs to reschedule

## 2016-06-02 NOTE — Progress Notes (Signed)
Follow-up Note Note: Gyn-Onc  Consult was initially requested by Dr. Elonda Husky for the evaluation of Briana Wiley 46 y.o. 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   Chief Complaint  Patient presents with  . VIN III  recurrent vulvar cancer   Assessment/Plan:  [name]  is a 46 y.o.  year old who has chronic paranoid schizophrenia, tobacco abuse and recurrent vulvar SCC s/p primary radiation (primary therapy completed February 13, 2014).  New recurrence of at least VIN3. Highly suspicious for recurrent vulvar cancer based on appearance. Unclear margins. Recommend surgical excision with right radical vulvectomy.   We discussed the surgery, its complications and risk profile and anticipated recovery. I discussed that infection and wound non-healing risk are substantially higher in patients with prior radiation and tobacco abuse.  She is not a candidate for additional radiation to the area. I will use the results of today's biopsies to guide the extent of the medial margins.  She firmly desires same day discharge from the hospital. I discussed the importance of minimal activity if she does this to minimize the likelihood of wound separation.  HPI: Briana Wiley is a 46 year old woman who was seen in consultation at the request of Dr. Elonda Husky for clinical stage II vulvar squamous cell carcinoma. 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 She also has a history significant for paranoid schizophrenia and resides in her care facility for this. She is an extremely heavy smoker and continues to smoke 1-2 packs per day. She began experiencing vulvar irritation in the past few months. She was seen by Dr. Elonda Husky for this. He recognized the posterior 5 cm raised erythematous vulva mass and performed a biopsy. Pathology revealed at least high-grade VIN associated with an area highly suspicious for squamous cell carcinoma.  PET  scan was performed which revealed positive inguinal and pelvic nodes.   She was treated with primary radiation with external beam radiation (45 gray to the pelvis and 55 gray with simulated integrated boost to the pelvic nodes). The patient then received a boost to the vulva area to a cumulative dose of 59.4 gray. Treatment dates were 12/26/13-02/13/14.  She tolerated therapy well with no issues.  Interval History: She has intermittent mild vulvar pain. Biopsy of vulva on 06/24/15 showed VIN III and she underwent CO2 laser with Dr. Denman George in April 2017. That was last time that she was seen by our service. She was seen by radiation oncology in December was noted to have a 1.5 cm ulcerative lesion on the labia and was subsequently sent in to see [place] today. She states that she believes that this lesion is a portal and a while was hurting and having some bleeding earlier is no longer hurting and she is no longer having any bleeding.  Biopsy on 04/27/16 with Dr Briana Wiley showed at least [place].   She failed to show for appointments after this biopsy. She reports increasing vulvar pain.  Current Meds:  Outpatient Encounter Prescriptions as of 06/03/2016  Medication Sig  . albuterol (PROVENTIL HFA;VENTOLIN HFA) 108 (90 BASE) MCG/ACT inhaler Inhale 2 puffs into the lungs every 4 (four) hours as needed for wheezing or shortness of breath.  . benzonatate (TESSALON) 100 MG capsule Take 1 capsule (100 mg total) by mouth 3 (three) times daily as needed for cough.  . benztropine (COGENTIN) 1 MG tablet Take 1 mg by mouth 2 (two) times daily.  Marland Kitchen  haloperidol (HALDOL) 5 MG tablet Take 5 mg by mouth at bedtime.  . haloperidol decanoate (HALDOL DECANOATE) 100 MG/ML injection Inject into the muscle every 28 (twenty-eight) days.  Marland Kitchen HYDROcodone-acetaminophen (NORCO/VICODIN) 5-325 MG tablet Take 1 tablet by mouth every 4 (four) hours as needed for moderate pain.  Marland Kitchen ibuprofen (ADVIL,MOTRIN) 400 MG tablet Take 400 mg by mouth every 6  (six) hours as needed.  . loperamide (IMODIUM A-D) 2 MG tablet Take 2 mg by mouth 4 (four) times daily as needed for diarrhea or loose stools. Reported on 09/24/2015  . naproxen (NAPROSYN) 250 MG tablet Take 1 tablet (250 mg total) by mouth 2 (two) times daily as needed for mild pain or moderate pain (take with food).  Marland Kitchen oxyCODONE-acetaminophen (PERCOCET) 10-325 MG tablet Take 1 tablet by mouth every 4 (four) hours as needed for pain.  . silver sulfADIAZINE (SILVADENE) 1 % cream Apply 1 application topically 2 (two) times daily. Reported on 09/24/2015  . solifenacin (VESICARE) 10 MG tablet Take by mouth daily.  . traZODone (DESYREL) 50 MG tablet Take 50 mg by mouth at bedtime. Takes 3 tabs at bedtime  . [DISCONTINUED] HYDROcodone-acetaminophen (NORCO/VICODIN) 5-325 MG tablet Take 1 tablet by mouth every 4 (four) hours as needed for moderate pain.   No facility-administered encounter medications on file as of 06/03/2016.     Allergy:  Allergies  Allergen Reactions  . Chicken Meat (Diagnostic) Nausea Only    Social Hx:   Social History   Social History  . Marital status: Legally Separated    Spouse name: N/A  . Number of children: 3  . Years of education: N/A   Occupational History  .  Unemployed   Social History Main Topics  . Smoking status: Current Every Day Smoker    Packs/day: 1.00    Years: 15.00    Types: Cigars, Cigarettes  . Smokeless tobacco: Former Systems developer    Types: Snuff, Chew  . Alcohol use No  . Drug use: No  . Sexual activity: No   Other Topics Concern  . Not on file   Social History Narrative  . No narrative on file    Past Surgical Hx:  Past Surgical History:  Procedure Laterality Date  . CO2 LASER APPLICATION N/A [id number]   Procedure: CO2 LASER OF THE VULVA;  Surgeon: [name], MD;  Location: [facility];  Service: Gynecology;  Laterality: N/A;  . DILATION AND CURETTAGE OF UTERUS  2009   w hysteroscopy/ ablation  . TUBAL LIGATION  Bilateral   . VULVA / PERINEUM BIOPSY  12/02/13  . VULVECTOMY PARTIAL  2003    Past Medical Hx:  Past Medical History:  Diagnosis Date  . Chronic schizophrenia ([place])   . Depression   . Radiation 12/26/13-02/13/14   45 gray to pelvis, pelvic nodes boosted to 55 gray, vulvar area boosted to 59.4 gray  . Trouble in sleeping   . Vulvar mass 11/07/2013  . Wears partial dentures     Past Gynecological History:  G3P3, SVD x3  No LMP recorded. Patient has had an ablation.  Family Hx:  Family History  Problem Relation Age of Onset  . Seizures Mother   . Hypertension Sister    Vitals:  Blood pressure (!) 114/92, pulse 86, temperature 98.2 F (36.8 C), temperature source Oral, resp. rate 18, height 5\' 6"  (1.676 m), weight 143 lb 6.4 oz (65 kg), SpO2 96 %.  Physical Exam: WD in NAD  Groins: Negative for lymphadenopathy.  Genito Urinary  Vulva/vagina:There are nodular hyperkeratotic areas of leukoplakia on the right towards the vagina. There is a 2 x1.5 cm raised lesion on the right vulva. Leukoplakia on right anterior and posterior(vaginal introitus) labia minora biopsied (see below).  PROCEDURE NOTE:  Verbal consent obtained. Time out performed. Vulvar prepped with betadine. 1% lidocaine (1cc) infiltrated into labia minora kevorkian biopsy forcep used to take pinch of tissue from anterior and posterior labia minora. Hemostasis achieved with silver nitrate. Specimens: 1/ posterior labia minora 2/ anterior labia minora to pathology EBL [id number] Complications: none Dispo: discharged to home stable    Donaciano Eva, MD   06/03/2016, 3:46 PM

## 2016-06-03 ENCOUNTER — Encounter: Payer: Self-pay | Admitting: Gynecologic Oncology

## 2016-06-03 ENCOUNTER — Ambulatory Visit: Payer: Medicare Other | Attending: Gynecologic Oncology | Admitting: Gynecologic Oncology

## 2016-06-03 VITALS — BP 114/92 | HR 86 | Temp 98.2°F | Resp 18 | Ht 66.0 in | Wt 143.4 lb

## 2016-06-03 DIAGNOSIS — D071 Carcinoma in situ of vulva: Secondary | ICD-10-CM | POA: Diagnosis present

## 2016-06-03 DIAGNOSIS — F200 Paranoid schizophrenia: Secondary | ICD-10-CM | POA: Diagnosis not present

## 2016-06-03 DIAGNOSIS — F17210 Nicotine dependence, cigarettes, uncomplicated: Secondary | ICD-10-CM | POA: Insufficient documentation

## 2016-06-03 DIAGNOSIS — Z79899 Other long term (current) drug therapy: Secondary | ICD-10-CM | POA: Diagnosis not present

## 2016-06-03 DIAGNOSIS — Z923 Personal history of irradiation: Secondary | ICD-10-CM

## 2016-06-03 DIAGNOSIS — C519 Malignant neoplasm of vulva, unspecified: Secondary | ICD-10-CM

## 2016-06-03 DIAGNOSIS — Z720 Tobacco use: Secondary | ICD-10-CM

## 2016-06-03 MED ORDER — HYDROCODONE-ACETAMINOPHEN 5-325 MG PO TABS: 1.0000 | ORAL_TABLET | ORAL | 0 refills | Status: DC | PRN

## 2016-06-03 NOTE — Patient Instructions (Signed)
                Preparing for your Surgery  Plan for surgery on June 23, 2016 with Dr. Denman George.  Pre-operative Testing -You will receive a phone call from presurgical testing at [facility]    before your surgery.   --You should not be taking blood thinners or aspirin at least ten days prior to surgery unless instructed by your surgeon.  Day Before Surgery at [facility] will be asked to take in a light diet the day before surgery.  Avoid carbonated beverages.  You will be advised to have nothing to eat or drink after midnight the evening before.     Eat a light diet the day before surgery.  Examples including soups, broths,  toast, yogurt, mashed potatoes.  Things to avoid include carbonated beverages  (fizzy beverages), raw fruits and raw vegetables, or beans.    If your bowels are filled with gas, your surgeon will have difficulty  visualizing your pelvic organs which increases your surgical risks.  Your role in recovery Your role is to become active as soon as directed by your doctor, while still giving yourself time to heal.  Rest when you feel tired. You will be asked to do the following in order to speed your recovery:   -Your final pathology results from surgery should be available by the Friday after surgery and the results will be relayed to you when available.

## 2016-06-07 ENCOUNTER — Telehealth: Payer: Self-pay

## 2016-06-07 NOTE — Telephone Encounter (Signed)
RN discussed with pt's case worker her upcoming surgical appointment and test results.

## 2016-06-07 NOTE — Telephone Encounter (Signed)
-----   Message from [name], MD sent at 06/07/2016 10:03 AM EST ----- The biopsies show precancerous areas outside of the nodule on the labia.  We will proceed with surgery as scheduled.

## 2016-06-20 NOTE — Patient Instructions (Addendum)
Briana Wiley  06/20/2016   Your procedure is scheduled on: 06-23-16  Report to [facility]  Entrance take [facility]  elevators to 3rd floor to  [place] at 530  AM.  Call this number if you have problems the morning of surgery [phone]   Remember: ONLY 1 PERSON MAY GO WITH YOU TO SHORT STAY TO GET  READY MORNING OF [place].  Do not eat food or drink liquids :After Midnight. LIGHT DIET ON DAY BEFORE SURGERY 06-22-16 SEE INSTRUCTINS BELOW     Take these medicines the morning of surgery with A SIP OF WATER:                                You may not have any metal on your body including hair pins and              piercings  Do not wear jewelry, make-up, lotions, powders or perfumes, deodorant             Do not wear nail polish.  Do not shave  48 hours prior to surgery.              Men may shave face and neck.   Do not bring valuables to the hospital. Van.  Contacts, dentures or bridgework may not be worn into surgery.  Leave suitcase in the car. After surgery it may be brought to your room.                 Please read over the following fact sheets you were given: _____________________________________________________________________             Eat a light diet the day before surgery.  Examples including soups, broths, toast, yogurt, mashed potatoes.  Things to avoid include carbonated beverages (fizzy beverages), raw fruits and raw vegetables, or beans.   If your bowels are filled with gas, your surgeon will have difficulty visualizing your pelvic organs which increases your surgical risks.[facility] - Preparing for Surgery Before surgery, you can play an important role.  Because skin is not sterile, your skin needs to be as free of germs as possible.  You can reduce the number of germs on your skin by washing with CHG (chlorahexidine gluconate) soap before surgery.  CHG is an  antiseptic cleaner which kills germs and bonds with the skin to continue killing germs even after washing. Please DO NOT use if you have an allergy to CHG or antibacterial soaps.  If your skin becomes reddened/irritated stop using the CHG and inform your nurse when you arrive at Short Stay. Do not shave (including legs and underarms) for at least 48 hours prior to the first CHG shower.  You may shave your face/neck. Please follow these instructions carefully:  1.  Shower with CHG Soap the night before surgery and the  morning of Surgery.  2.  If you choose to wash your hair, wash your hair first as usual with your  normal  shampoo.  3.  After you shampoo, rinse your hair and body thoroughly to remove the  shampoo.  4.  Use CHG as you would any other liquid soap.  You can apply chg directly  to the skin and wash                       Gently with a scrungie or clean washcloth.  5.  Apply the CHG Soap to your body ONLY FROM THE NECK DOWN.   Do not use on face/ open                           Wound or open sores. Avoid contact with eyes, ears mouth and genitals (private parts).                       Wash face,  Genitals (private parts) with your normal soap.             6.  Wash thoroughly, paying special attention to the area where your surgery  will be performed.  7.  Thoroughly rinse your body with warm water from the neck down.  8.  DO NOT shower/wash with your normal soap after using and rinsing off  the CHG Soap.                9.  Pat yourself dry with a clean towel.            10.  Wear clean pajamas.            11.  Place clean sheets on your bed the night of your first shower and do not  sleep with pets. Day of Surgery : Do not apply any lotions/deodorants the morning of surgery.  Please wear clean clothes to the hospital/surgery center.  FAILURE TO FOLLOW THESE INSTRUCTIONS MAY RESULT IN THE CANCELLATION OF YOUR SURGERY PATIENT  SIGNATURE_________________________________  NURSE SIGNATURE__________________________________  ________________________________________________________________________   Adam Phenix  An incentive spirometer is a tool that can help keep your lungs clear and active. This tool measures how well you are filling your lungs with each breath. Taking long deep breaths may help reverse or decrease the chance of developing breathing (pulmonary) problems (especially infection) following:  A long period of time when you are unable to move or be active. BEFORE THE PROCEDURE   If the spirometer includes an indicator to show your best effort, your nurse or respiratory therapist will set it to a desired goal.  If possible, sit up straight or lean slightly forward. Try not to slouch.  Hold the incentive spirometer in an upright position. INSTRUCTIONS FOR USE  1. Sit on the edge of your bed if possible, or sit up as far as you can in bed or on a chair. 2. Hold the incentive spirometer in an upright position. 3. Breathe out normally. 4. Place the mouthpiece in your mouth and seal your lips tightly around it. 5. Breathe in slowly and as deeply as possible, raising the piston or the ball toward the top of the column. 6. Hold your breath for 3-5 seconds or for as long as possible. Allow the piston or ball to fall to the bottom of the column. 7. Remove the mouthpiece from your mouth and breathe out normally. 8. Rest for a few seconds and repeat Steps 1 through 7 at least 10 times every 1-2 hours when you are awake. Take your time and take a few normal breaths between deep breaths. 9. The spirometer may include an indicator to show  your best effort. Use the indicator as a goal to work toward during each repetition. 10. After each set of 10 deep breaths, practice coughing to be sure your lungs are clear. If you have an incision (the cut made at the time of surgery), support your incision when coughing  by placing a pillow or rolled up towels firmly against it. Once you are able to get out of bed, walk around indoors and cough well. You may stop using the incentive spirometer when instructed by your caregiver.  RISKS AND COMPLICATIONS  Take your time so you do not get dizzy or light-headed.  If you are in pain, you may need to take or ask for pain medication before doing incentive spirometry. It is harder to take a deep breath if you are having pain. AFTER USE  Rest and breathe slowly and easily.  It can be helpful to keep track of a log of your progress. Your caregiver can provide you with a simple table to help with this. If you are using the spirometer at home, follow these instructions: [place] IF:   You are having difficultly using the spirometer.  You have trouble using the spirometer as often as instructed.  Your pain medication is not giving enough relief while using the spirometer.  You develop fever of 100.5 F (38.1 C) or higher. SEEK IMMEDIATE MEDICAL CARE IF:   You cough up bloody sputum that had not been present before.  You develop fever of 102 F (38.9 C) or greater.  You develop worsening pain at or near the incision site. MAKE SURE YOU:   Understand these instructions.  Will watch your condition.  Will get help right away if you are not doing well or get worse. Document Released: 08/22/2006 Document Revised: 07/04/2011 Document Reviewed: 10/23/2006 ExitCare Patient Information 2014 ExitCare, [place].   ________________________________________________________________________  WHAT IS A BLOOD TRANSFUSION? Blood Transfusion Information  A transfusion is the replacement of blood or some of its parts. Blood is made up of multiple cells which provide different functions.  Red blood cells carry oxygen and are used for blood loss replacement.  White blood cells fight against infection.  Platelets control bleeding.  Plasma helps clot  blood.  Other blood products are available for specialized needs, such as hemophilia or other clotting disorders. BEFORE THE TRANSFUSION  Who gives blood for transfusions?   Healthy volunteers who are fully evaluated to make sure their blood is safe. This is blood bank blood. Transfusion therapy is the safest it has ever been in the practice of medicine. Before blood is taken from a donor, a complete history is taken to make sure that person has no history of diseases nor engages in risky social behavior (examples are intravenous drug use or sexual activity with multiple partners). The donor's travel history is screened to minimize risk of transmitting infections, such as malaria. The donated blood is tested for signs of infectious diseases, such as HIV and hepatitis. The blood is then tested to be sure it is compatible with you in order to minimize the chance of a transfusion reaction. If you or a relative donates blood, this is often done in anticipation of surgery and is not appropriate for emergency situations. It takes many days to process the donated blood. RISKS AND COMPLICATIONS Although transfusion therapy is very safe and saves many lives, the main dangers of transfusion include:   Getting an infectious disease.  Developing a transfusion reaction. This is an allergic reaction to  something in the blood you were given. Every precaution is taken to prevent this. The decision to have a blood transfusion has been considered carefully by your caregiver before blood is given. Blood is not given unless the benefits outweigh the risks. AFTER THE TRANSFUSION  Right after receiving a blood transfusion, you will usually feel much better and more energetic. This is especially true if your red blood cells have gotten low (anemic). The transfusion raises the level of the red blood cells which carry oxygen, and this usually causes an energy increase.  The nurse administering the transfusion will monitor  you carefully for complications. HOME CARE INSTRUCTIONS  No special instructions are needed after a transfusion. You may find your energy is better. Speak with your caregiver about any limitations on activity for underlying diseases you may have. SEEK MEDICAL CARE IF:   Your condition is not improving after your transfusion.  You develop redness or irritation at the intravenous (IV) site. SEEK IMMEDIATE MEDICAL CARE IF:  Any of the following symptoms occur over the next 12 hours:  Shaking chills.  You have a temperature by mouth above 102 F (38.9 C), not controlled by medicine.  Chest, back, or muscle pain.  People around you feel you are not acting correctly or are confused.  Shortness of breath or difficulty breathing.  Dizziness and fainting.  You get a rash or develop hives.  You have a decrease in urine output.  Your urine turns a dark color or changes to pink, red, or brown. Any of the following symptoms occur over the next 10 days:  You have a temperature by mouth above 102 F (38.9 C), not controlled by medicine.  Shortness of breath.  Weakness after normal activity.  The white part of the eye turns yellow (jaundice).  You have a decrease in the amount of urine or are urinating less often.  Your urine turns a dark color or changes to pink, red, or brown. Document Released: 04/08/2000 Document Revised: 07/04/2011 Document Reviewed: 11/26/2007 [facility] Patient Information 2014 [place], [place].  _______________________________________________________________________

## 2016-06-21 ENCOUNTER — Inpatient Hospital Stay (HOSPITAL_COMMUNITY)
Admission: RE | Admit: 2016-06-21 | Discharge: 2016-06-21 | Disposition: A | Discharge status: Home / Self Care | Payer: Medicare Other | Source: Ambulatory Visit

## 2016-06-21 ENCOUNTER — Telehealth: Payer: Self-pay

## 2016-06-21 NOTE — Telephone Encounter (Signed)
Pt resides at group home and the [facility] called to inform our office that Briana Wiley wanted to cancel her surgery for Right Radical vulvectomy on 06/23/16. Briana Wiley stated that she had several discussions with Briana Wiley stressing the importance of the procedure. However, the patient has chosen to not have surgery at this time.  RN contacted OR scheduling and will inform Dr Denman George.

## 2016-06-21 NOTE — Progress Notes (Signed)
SPOKE Meade MANGER PATIENT REFUSING SURGERY, CALL TRANSFERRED TO LORI DR ROSSI NURSE.

## 2016-06-22 ENCOUNTER — Encounter (HOSPITAL_COMMUNITY): Payer: Self-pay | Admitting: Anesthesiology

## 2016-06-22 ENCOUNTER — Encounter (HOSPITAL_COMMUNITY): Payer: Self-pay

## 2016-06-22 ENCOUNTER — Encounter (HOSPITAL_COMMUNITY)
Admission: RE | Admit: 2016-06-22 | Discharge: 2016-06-22 | Disposition: A | Discharge status: Home / Self Care | Payer: Medicare Other | Source: Ambulatory Visit | Attending: Gynecologic Oncology | Admitting: Gynecologic Oncology

## 2016-06-22 ENCOUNTER — Telehealth: Payer: Self-pay

## 2016-06-22 ENCOUNTER — Emergency Department (HOSPITAL_COMMUNITY)
Admission: EM | Admit: 2016-06-22 | Discharge: 2016-06-22 | Disposition: A | Discharge status: Home / Self Care | Payer: Medicare Other

## 2016-06-22 DIAGNOSIS — F329 Major depressive disorder, single episode, unspecified: Secondary | ICD-10-CM | POA: Diagnosis not present

## 2016-06-22 DIAGNOSIS — Z79899 Other long term (current) drug therapy: Secondary | ICD-10-CM | POA: Diagnosis not present

## 2016-06-22 DIAGNOSIS — Z791 Long term (current) use of non-steroidal anti-inflammatories (NSAID): Secondary | ICD-10-CM | POA: Diagnosis not present

## 2016-06-22 DIAGNOSIS — F200 Paranoid schizophrenia: Secondary | ICD-10-CM | POA: Diagnosis not present

## 2016-06-22 DIAGNOSIS — Z7951 Long term (current) use of inhaled steroids: Secondary | ICD-10-CM | POA: Diagnosis not present

## 2016-06-22 DIAGNOSIS — C519 Malignant neoplasm of vulva, unspecified: Secondary | ICD-10-CM | POA: Diagnosis not present

## 2016-06-22 DIAGNOSIS — Z79891 Long term (current) use of opiate analgesic: Secondary | ICD-10-CM | POA: Diagnosis not present

## 2016-06-22 DIAGNOSIS — Z923 Personal history of irradiation: Secondary | ICD-10-CM | POA: Diagnosis not present

## 2016-06-22 LAB — HCG, SERUM, QUALITATIVE: Preg, Serum: POSITIVE — AB

## 2016-06-22 LAB — CBC WITH DIFFERENTIAL/PLATELET
BASOS ABS: 0 10*3/uL (ref 0.0–0.1)
BASOS PCT: 0 %
EOS ABS: 0.2 10*3/uL (ref 0.0–0.7)
EOS PCT: 2 %
HCT: 42.6 % (ref 36.0–46.0)
Hemoglobin: 14.6 g/dL (ref 12.0–15.0)
LYMPHS PCT: 18 %
Lymphs Abs: 1.4 10*3/uL (ref 0.7–4.0)
MCH: 30.7 pg (ref 26.0–34.0)
MCHC: 34.3 g/dL (ref 30.0–36.0)
MCV: 89.5 fL (ref 78.0–100.0)
MONO ABS: 0.6 10*3/uL (ref 0.1–1.0)
Monocytes Relative: 8 %
Neutro Abs: 5.3 10*3/uL (ref 1.7–7.7)
Neutrophils Relative %: 72 %
PLATELETS: 156 10*3/uL (ref 150–400)
RBC: 4.76 MIL/uL (ref 3.87–5.11)
RDW: 14.8 % (ref 11.5–15.5)
WBC: 7.5 10*3/uL (ref 4.0–10.5)

## 2016-06-22 LAB — COMPREHENSIVE METABOLIC PANEL
ALBUMIN: 4.3 g/dL (ref 3.5–5.0)
ALT: 11 U/L — ABNORMAL LOW (ref 14–54)
AST: 15 U/L (ref 15–41)
Alkaline Phosphatase: 140 U/L — ABNORMAL HIGH (ref 38–126)
Anion gap: 7 (ref 5–15)
BUN: 8 mg/dL (ref 6–20)
CHLORIDE: 108 mmol/L (ref 101–111)
CO2: 27 mmol/L (ref 22–32)
CREATININE: 0.77 mg/dL (ref 0.44–1.00)
Calcium: 9.4 mg/dL (ref 8.9–10.3)
GFR calc Af Amer: >60 mL/min (ref >60)
GLUCOSE: 77 mg/dL (ref 65–99)
POTASSIUM: 4.1 mmol/L (ref 3.5–5.1)
SODIUM: 142 mmol/L (ref 135–145)
Total Bilirubin: 0.5 mg/dL (ref 0.3–1.2)
Total Protein: 6.8 g/dL (ref 6.5–8.1)

## 2016-06-22 LAB — URINALYSIS, ROUTINE W REFLEX MICROSCOPIC
BILIRUBIN URINE: NEGATIVE
GLUCOSE, UA: NEGATIVE mg/dL
Ketones, ur: NEGATIVE mg/dL
NITRITE: NEGATIVE
PROTEIN: NEGATIVE mg/dL
Specific Gravity, Urine: 1.003 — ABNORMAL LOW (ref 1.005–1.030)
pH: 5 (ref 5.0–8.0)

## 2016-06-22 LAB — ABO/RH: ABO/RH(D): O POS

## 2016-06-22 NOTE — Patient Instructions (Addendum)
Briana Wiley  06/22/2016   Your procedure is scheduled on: 06/23/2016    Report to [facility] Main  Entrance take [place]  elevators to 3rd floor to  [place] at    [place] AM.  Call this number if you have problems the morning of surgery [phone]   Remember: ONLY 1 PERSON MAY GO WITH YOU TO SHORT STAY TO GET  READY MORNING OF [place].  Do not eat food or drink liquids :After Midnight.             Eat a light diet the day before surgery.  Examples include; toast, yogurt, soups, broths and mashed potatoes. Avoid carbonated beverages, raw fruits and vegetables and beans.               Drink plenty of clear liquids the day before surgery.       Take these medicines the morning of surgery with A SIP OF WATER: Albuterol Inhaler if needed and bring , cogentin                                 You may not have any metal on your body including hair pins and              piercings  Do not wear jewelry, make-up, lotions, powders or perfumes, deodorant             Do not wear nail polish.  Do not shave  48 hours prior to surgery.     Do not bring valuables to the hospital. [place].  Contacts, dentures or bridgework may not be worn into surgery.  Leave suitcase in the car. After surgery it may be brought to your room.     Marland Kitchen       CLEAR LIQUID DIET   Foods Allowed                                                                     Foods Excluded  Coffee and tea, regular and decaf                             liquids that you cannot  Plain Jell-O in any flavor                                             see through such as: Fruit ices (not with fruit pulp)                                     milk, soups, orange juice  Iced Popsicles  All solid food Carbonated beverages, regular and diet                                    Cranberry, grape and apple juices Sports  drinks like Gatorade Lightly seasoned clear broth or consume(fat free) Sugar, honey syrup  Sample Menu Breakfast                                Lunch                                     Supper Cranberry juice                    Beef broth                            Chicken broth Jell-O                                     Grape juice                           Apple juice Coffee or tea                        Jell-O                                      Popsicle                                                Coffee or tea                        Coffee or tea  _____________________________________________________________________                Please read over the following fact sheets you were given: _____________________________________________________________________             [facility] - Preparing for Surgery Before surgery, you can play an important role.  Because skin is not sterile, your skin needs to be as free of germs as possible.  You can reduce the number of germs on your skin by washing with CHG (chlorahexidine gluconate) soap before surgery.  CHG is an antiseptic cleaner which kills germs and bonds with the skin to continue killing germs even after washing. Please DO NOT use if you have an allergy to CHG or antibacterial soaps.  If your skin becomes reddened/irritated stop using the CHG and inform your nurse when you arrive at Short Stay. Do not shave (including legs and underarms) for at least 48 hours prior to the first CHG shower.  You may shave your face/neck. Please follow these instructions carefully:  1.  Shower with CHG Soap the night before surgery and the  morning of Surgery.  2.  If you choose to wash your hair, wash your  hair first as usual with your  normal  shampoo.  3.  After you shampoo, rinse your hair and body thoroughly to remove the  shampoo.                           4.  Use CHG as you would any other liquid soap.  You can apply chg directly  to the skin  and wash                       Gently with a scrungie or clean washcloth.  5.  Apply the CHG Soap to your body ONLY FROM THE NECK DOWN.   Do not use on face/ open                           Wound or open sores. Avoid contact with eyes, ears mouth and genitals (private parts).                       Wash face,  Genitals (private parts) with your normal soap.             6.  Wash thoroughly, paying special attention to the area where your surgery  will be performed.  7.  Thoroughly rinse your body with warm water from the neck down.  8.  DO NOT shower/wash with your normal soap after using and rinsing off  the CHG Soap.                9.  Pat yourself dry with a clean towel.            10.  Wear clean pajamas.            11.  Place clean sheets on your bed the night of your first shower and do not  sleep with pets. Day of Surgery : Do not apply any lotions/deodorants the morning of surgery.  Please wear clean clothes to the hospital/surgery center.  FAILURE TO FOLLOW THESE INSTRUCTIONS MAY RESULT IN THE CANCELLATION OF YOUR SURGERY PATIENT SIGNATURE_________________________________  NURSE SIGNATURE__________________________________  ________________________________________________________________________   Adam Phenix  An incentive spirometer is a tool that can help keep your lungs clear and active. This tool measures how well you are filling your lungs with each breath. Taking long deep breaths may help reverse or decrease the chance of developing breathing (pulmonary) problems (especially infection) following:  A long period of time when you are unable to move or be active. BEFORE THE PROCEDURE   If the spirometer includes an indicator to show your best effort, your nurse or respiratory therapist will set it to a desired goal.  If possible, sit up straight or lean slightly forward. Try not to slouch.  Hold the incentive spirometer in an upright position. INSTRUCTIONS FOR USE   1. Sit on the edge of your bed if possible, or sit up as far as you can in bed or on a chair. 2. Hold the incentive spirometer in an upright position. 3. Breathe out normally. 4. Place the mouthpiece in your mouth and seal your lips tightly around it. 5. Breathe in slowly and as deeply as possible, raising the piston or the ball toward the top of the column. 6. Hold your breath for 3-5 seconds or for as long as possible. Allow the piston or ball to fall to the bottom of the column. 7.  Remove the mouthpiece from your mouth and breathe out normally. 8. Rest for a few seconds and repeat Steps 1 through 7 at least 10 times every 1-2 hours when you are awake. Take your time and take a few normal breaths between deep breaths. 9. The spirometer may include an indicator to show your best effort. Use the indicator as a goal to work toward during each repetition. 10. After each set of 10 deep breaths, practice coughing to be sure your lungs are clear. If you have an incision (the cut made at the time of surgery), support your incision when coughing by placing a pillow or rolled up towels firmly against it. Once you are able to get out of bed, walk around indoors and cough well. You may stop using the incentive spirometer when instructed by your caregiver.  RISKS AND COMPLICATIONS  Take your time so you do not get dizzy or light-headed.  If you are in pain, you may need to take or ask for pain medication before doing incentive spirometry. It is harder to take a deep breath if you are having pain. AFTER USE  Rest and breathe slowly and easily.  It can be helpful to keep track of a log of your progress. Your caregiver can provide you with a simple table to help with this. If you are using the spirometer at home, follow these instructions: [place] IF:   You are having difficultly using the spirometer.  You have trouble using the spirometer as often as instructed.  Your pain medication is  not giving enough relief while using the spirometer.  You develop fever of 100.5 F (38.1 C) or higher. SEEK IMMEDIATE MEDICAL CARE IF:   You cough up bloody sputum that had not been present before.  You develop fever of 102 F (38.9 C) or greater.  You develop worsening pain at or near the incision site. MAKE SURE YOU:   Understand these instructions.  Will watch your condition.  Will get help right away if you are not doing well or get worse. Document Released: 08/22/2006 Document Revised: 07/04/2011 Document Reviewed: 10/23/2006 ExitCare Patient Information 2014 ExitCare, [place].   ________________________________________________________________________  WHAT IS A BLOOD TRANSFUSION? Blood Transfusion Information  A transfusion is the replacement of blood or some of its parts. Blood is made up of multiple cells which provide different functions.  Red blood cells carry oxygen and are used for blood loss replacement.  White blood cells fight against infection.  Platelets control bleeding.  Plasma helps clot blood.  Other blood products are available for specialized needs, such as hemophilia or other clotting disorders. BEFORE THE TRANSFUSION  Who gives blood for transfusions?   Healthy volunteers who are fully evaluated to make sure their blood is safe. This is blood bank blood. Transfusion therapy is the safest it has ever been in the practice of medicine. Before blood is taken from a donor, a complete history is taken to make sure that person has no history of diseases nor engages in risky social behavior (examples are intravenous drug use or sexual activity with multiple partners). The donor's travel history is screened to minimize risk of transmitting infections, such as malaria. The donated blood is tested for signs of infectious diseases, such as HIV and hepatitis. The blood is then tested to be sure it is compatible with you in order to minimize the chance of a  transfusion reaction. If you or a relative donates blood, this is often done in anticipation  of surgery and is not appropriate for emergency situations. It takes many days to process the donated blood. RISKS AND COMPLICATIONS Although transfusion therapy is very safe and saves many lives, the main dangers of transfusion include:   Getting an infectious disease.  Developing a transfusion reaction. This is an allergic reaction to something in the blood you were given. Every precaution is taken to prevent this. The decision to have a blood transfusion has been considered carefully by your caregiver before blood is given. Blood is not given unless the benefits outweigh the risks. AFTER THE TRANSFUSION  Right after receiving a blood transfusion, you will usually feel much better and more energetic. This is especially true if your red blood cells have gotten low (anemic). The transfusion raises the level of the red blood cells which carry oxygen, and this usually causes an energy increase.  The nurse administering the transfusion will monitor you carefully for complications. HOME CARE INSTRUCTIONS  No special instructions are needed after a transfusion. You may find your energy is better. Speak with your caregiver about any limitations on activity for underlying diseases you may have. SEEK MEDICAL CARE IF:   Your condition is not improving after your transfusion.  You develop redness or irritation at the intravenous (IV) site. SEEK IMMEDIATE MEDICAL CARE IF:  Any of the following symptoms occur over the next 12 hours:  Shaking chills.  You have a temperature by mouth above 102 F (38.9 C), not controlled by medicine.  Chest, back, or muscle pain.  People around you feel you are not acting correctly or are confused.  Shortness of breath or difficulty breathing.  Dizziness and fainting.  You get a rash or develop hives.  You have a decrease in urine output.  Your urine turns a dark  color or changes to pink, red, or brown. Any of the following symptoms occur over the next 10 days:  You have a temperature by mouth above 102 F (38.9 C), not controlled by medicine.  Shortness of breath.  Weakness after normal activity.  The white part of the eye turns yellow (jaundice).  You have a decrease in the amount of urine or are urinating less often.  Your urine turns a dark color or changes to pink, red, or brown. Document Released: 04/08/2000 Document Revised: 07/04/2011 Document Reviewed: 11/26/2007 [facility] Patient Information 2014 [place], [place].  _______________________________________________________________________

## 2016-06-22 NOTE — Anesthesia Preprocedure Evaluation (Addendum)
Anesthesia Evaluation  Patient identified by MRN, date of birth, ID band Patient awake    Reviewed: Allergy & Precautions, NPO status , Patient's Chart, lab work & pertinent test results  Airway Mallampati: II  TM Distance: >3 FB Neck ROM: Full    Dental no notable dental hx. (+) Missing, Poor Dentition   Pulmonary shortness of breath and with exertion, Current Smoker,    Pulmonary exam normal breath sounds clear to auscultation       Cardiovascular hypertension, Pt. on medications + DOE  Normal cardiovascular exam Rhythm:Regular Rate:Normal     Neuro/Psych  Headaches, PSYCHIATRIC DISORDERS Depression Schizophrenia    GI/Hepatic negative GI ROS, Neg liver ROS,   Endo/Other  negative endocrine ROS  Renal/GU negative Renal ROS  negative genitourinary   Musculoskeletal negative musculoskeletal ROS (+)   Abdominal   Peds  Hematology negative hematology ROS (+)   Anesthesia Other Findings   Reproductive/Obstetrics (+) Pregnancy Vulvar Ca Positive uHCG                          Anesthesia Physical Anesthesia Plan  ASA: III  Anesthesia Plan: General   Post-op Pain Management:    Induction: Intravenous  Airway Management Planned: LMA  Additional Equipment:   Intra-op Plan:   Post-operative Plan: Extubation in OR  Informed Consent: I have reviewed the patients History and Physical, chart, labs and discussed the procedure including the risks, benefits and alternatives for the proposed anesthesia with the patient or authorized representative who has indicated his/her understanding and acceptance.   Dental advisory given  Plan Discussed with: Anesthesiologist, CRNA and Surgeon  Anesthesia Plan Comments:        Anesthesia Quick Evaluation

## 2016-06-22 NOTE — Progress Notes (Addendum)
CMp, U/A,  Serum PReg  Done 06/22/16 faxed via EPIC to Dr Denman George.  Called Lorie Shutt RN at [facility]  and made her aware labs had been routed above to Dr Denman George and what abnormal results were.

## 2016-06-22 NOTE — Telephone Encounter (Signed)
Call from PAT to report that positive serum HCG and abnormal urine results, nitrate negative, RN notified Dr Denman George and plan is to keep current surgery as planned.

## 2016-06-23 ENCOUNTER — Ambulatory Visit (HOSPITAL_COMMUNITY): Payer: Medicare Other | Admitting: Anesthesiology

## 2016-06-23 ENCOUNTER — Encounter (HOSPITAL_COMMUNITY): Payer: Self-pay | Admitting: *Deleted

## 2016-06-23 ENCOUNTER — Encounter (HOSPITAL_COMMUNITY)
Admission: RE | Disposition: A | Discharge status: Home / Self Care | Payer: Self-pay | Source: Ambulatory Visit | Attending: Gynecologic Oncology

## 2016-06-23 ENCOUNTER — Ambulatory Visit (HOSPITAL_COMMUNITY)
Admission: RE | Admit: 2016-06-23 | Discharge: 2016-06-23 | Disposition: A | Discharge status: Home / Self Care | Payer: Medicare Other | Source: Ambulatory Visit | Attending: Gynecologic Oncology | Admitting: Gynecologic Oncology

## 2016-06-23 ENCOUNTER — Encounter (HOSPITAL_COMMUNITY): Admission: RE | Payer: Self-pay | Source: Ambulatory Visit

## 2016-06-23 ENCOUNTER — Ambulatory Visit (HOSPITAL_COMMUNITY): Admission: RE | Admit: 2016-06-23 | Payer: Medicare Other | Source: Ambulatory Visit | Admitting: Gynecologic Oncology

## 2016-06-23 DIAGNOSIS — N390 Urinary tract infection, site not specified: Secondary | ICD-10-CM | POA: Diagnosis present

## 2016-06-23 DIAGNOSIS — Z79891 Long term (current) use of opiate analgesic: Secondary | ICD-10-CM | POA: Insufficient documentation

## 2016-06-23 DIAGNOSIS — Z79899 Other long term (current) drug therapy: Secondary | ICD-10-CM | POA: Insufficient documentation

## 2016-06-23 DIAGNOSIS — Z791 Long term (current) use of non-steroidal anti-inflammatories (NSAID): Secondary | ICD-10-CM | POA: Insufficient documentation

## 2016-06-23 DIAGNOSIS — F209 Schizophrenia, unspecified: Secondary | ICD-10-CM | POA: Diagnosis not present

## 2016-06-23 DIAGNOSIS — C510 Malignant neoplasm of labium majus: Secondary | ICD-10-CM | POA: Diagnosis not present

## 2016-06-23 DIAGNOSIS — F200 Paranoid schizophrenia: Secondary | ICD-10-CM | POA: Insufficient documentation

## 2016-06-23 DIAGNOSIS — D071 Carcinoma in situ of vulva: Secondary | ICD-10-CM | POA: Diagnosis not present

## 2016-06-23 DIAGNOSIS — C519 Malignant neoplasm of vulva, unspecified: Secondary | ICD-10-CM | POA: Diagnosis not present

## 2016-06-23 DIAGNOSIS — Z7951 Long term (current) use of inhaled steroids: Secondary | ICD-10-CM | POA: Diagnosis not present

## 2016-06-23 DIAGNOSIS — Z923 Personal history of irradiation: Secondary | ICD-10-CM | POA: Insufficient documentation

## 2016-06-23 DIAGNOSIS — F329 Major depressive disorder, single episode, unspecified: Secondary | ICD-10-CM | POA: Diagnosis not present

## 2016-06-23 DIAGNOSIS — I10 Essential (primary) hypertension: Secondary | ICD-10-CM | POA: Diagnosis not present

## 2016-06-23 DIAGNOSIS — R51 Headache: Secondary | ICD-10-CM | POA: Diagnosis not present

## 2016-06-23 HISTORY — PX: RADICAL VULVECTOMY: SHX6584

## 2016-06-23 LAB — TYPE AND SCREEN
ABO/RH(D): O POS
Antibody Screen: NEGATIVE

## 2016-06-23 SURGERY — VULVECTOMY, RADICAL
Anesthesia: General | Laterality: Right

## 2016-06-23 MED ORDER — CEFAZOLIN SODIUM-DEXTROSE 2-4 GM/100ML-% IV SOLN
2.0000 g | INTRAVENOUS | Status: AC
  Administered 2016-06-23: 2 g via INTRAVENOUS

## 2016-06-23 MED ORDER — LACTATED RINGERS IV SOLN
INTRAVENOUS | Status: DC | PRN
  Administered 2016-06-23: 07:00:00 via INTRAVENOUS

## 2016-06-23 MED ORDER — DEXAMETHASONE SODIUM PHOSPHATE 10 MG/ML IJ SOLN
INTRAMUSCULAR | Status: DC | PRN
  Administered 2016-06-23: 10 mg via INTRAVENOUS

## 2016-06-23 MED ORDER — HYDROCODONE-ACETAMINOPHEN 5-325 MG PO TABS: 1.0000 | ORAL_TABLET | Freq: Four times a day (QID) | ORAL | 0 refills | Status: DC | PRN

## 2016-06-23 MED ORDER — BUPIVACAINE HCL 0.25 % IJ SOLN
INTRAMUSCULAR | Status: DC | PRN
  Administered 2016-06-23: 20 mL

## 2016-06-23 MED ORDER — BUPIVACAINE HCL (PF) 0.25 % IJ SOLN
INTRAMUSCULAR | Status: AC
  Filled 2016-06-23: qty 30

## 2016-06-23 MED ORDER — PROPOFOL 10 MG/ML IV BOLUS
INTRAVENOUS | Status: DC | PRN
  Administered 2016-06-23: 150 mg via INTRAVENOUS

## 2016-06-23 MED ORDER — HYDROCODONE-ACETAMINOPHEN 7.5-325 MG PO TABS: 1.0000 | ORAL_TABLET | Freq: Once | ORAL | Status: DC | PRN

## 2016-06-23 MED ORDER — SUFENTANIL CITRATE 50 MCG/ML IV SOLN
INTRAVENOUS | Status: DC | PRN
  Administered 2016-06-23: 10 ug via INTRAVENOUS
  Administered 2016-06-23: 5 ug via INTRAVENOUS

## 2016-06-23 MED ORDER — FENTANYL CITRATE (PF) 100 MCG/2ML IJ SOLN
25.0000 ug | INTRAMUSCULAR | Status: DC | PRN
  Administered 2016-06-23: 50 ug via INTRAVENOUS

## 2016-06-23 MED ORDER — LIDOCAINE 2% (20 MG/ML) 5 ML SYRINGE
INTRAMUSCULAR | Status: DC | PRN
  Administered 2016-06-23: 100 mg via INTRAVENOUS

## 2016-06-23 MED ORDER — ACETIC ACID 5 % SOLN: 1.0000 "application " | Freq: Once | Status: DC

## 2016-06-23 MED ORDER — ONDANSETRON HCL 4 MG/2ML IJ SOLN: 4.0000 mg | Freq: Once | INTRAMUSCULAR | Status: DC | PRN

## 2016-06-23 MED ORDER — ENOXAPARIN SODIUM 40 MG/0.4ML ~~LOC~~ SOLN
40.0000 mg | SUBCUTANEOUS | Status: AC
  Administered 2016-06-23: 40 mg via SUBCUTANEOUS
  Filled 2016-06-23: qty 0.4

## 2016-06-23 MED ORDER — ONDANSETRON HCL 4 MG/2ML IJ SOLN
INTRAMUSCULAR | Status: DC | PRN
  Administered 2016-06-23: 4 mg via INTRAVENOUS

## 2016-06-23 MED ORDER — FENTANYL CITRATE (PF) 100 MCG/2ML IJ SOLN
INTRAMUSCULAR | Status: AC
  Filled 2016-06-23: qty 2

## 2016-06-23 SURGICAL SUPPLY — 39 items
BLADE 15 SAFETY STRL DISP (BLADE) ×2 IMPLANT
BLADE SURG 15 STRL LF DISP TIS (BLADE) ×2 IMPLANT
BLADE SURG 15 STRL SS (BLADE) ×4
COVER SURGICAL LIGHT HANDLE (MISCELLANEOUS) ×2 IMPLANT
DRAPE SURG IRRIG POUCH 19X23 (DRAPES) ×2 IMPLANT
DRSG TELFA 3X8 NADH (GAUZE/BANDAGES/DRESSINGS) ×2 IMPLANT
ELECT PENCIL ROCKER SW 15FT (MISCELLANEOUS) ×2 IMPLANT
GAUZE SPONGE 4X4 12PLY STRL (GAUZE/BANDAGES/DRESSINGS) ×2 IMPLANT
GAUZE SPONGE 4X4 16PLY XRAY LF (GAUZE/BANDAGES/DRESSINGS) ×4 IMPLANT
GLOVE BIO SURGEON STRL SZ 6 (GLOVE) ×4 IMPLANT
GOWN STRL REUS W/ TWL LRG LVL3 (GOWN DISPOSABLE) ×2 IMPLANT
GOWN STRL REUS W/TWL LRG LVL3 (GOWN DISPOSABLE) ×4
KIT BASIN OR (CUSTOM PROCEDURE TRAY) ×2 IMPLANT
NDL SPNL 22GX7 QUINCKE BK (NEEDLE) IMPLANT
NEEDLE HYPO 22GX1.5 SAFETY (NEEDLE) ×2 IMPLANT
NEEDLE SPNL 22GX7 QUINCKE BK (NEEDLE) IMPLANT
NS IRRIG 1000ML POUR BTL (IV SOLUTION) ×2 IMPLANT
PACK LITHOTOMY IV (CUSTOM PROCEDURE TRAY) ×2 IMPLANT
PAD DRESSING TELFA 3X8 NADH (GAUZE/BANDAGES/DRESSINGS) ×1 IMPLANT
SCOPETTES 8  STERILE (MISCELLANEOUS)
SCOPETTES 8 STERILE (MISCELLANEOUS) IMPLANT
SHEARS HARMONIC 9CM CVD (BLADE) ×2 IMPLANT
SUT VIC AB 0 CT1 27 (SUTURE)
SUT VIC AB 0 CT1 27XBRD ANTBC (SUTURE) IMPLANT
SUT VIC AB 2-0 SH 27 (SUTURE) ×8
SUT VIC AB 2-0 SH 27X BRD (SUTURE) ×4 IMPLANT
SUT VIC AB 3-0 SH 27 (SUTURE) ×8
SUT VIC AB 3-0 SH 27XBRD (SUTURE) ×5 IMPLANT
SUT VIC AB 4-0 P2 18 (SUTURE) IMPLANT
SUT VIC AB 4-0 SH 27 (SUTURE) ×6
SUT VIC AB 4-0 SH 27XBRD (SUTURE) ×5 IMPLANT
SYR BULB IRRIGATION 50ML (SYRINGE) ×2 IMPLANT
SYR CONTROL 10ML LL (SYRINGE) ×2 IMPLANT
TOWEL OR 17X26 10 PK STRL BLUE (TOWEL DISPOSABLE) ×2 IMPLANT
TOWEL OR NON WOVEN STRL DISP B (DISPOSABLE) ×2 IMPLANT
TRAY FOLEY W/METER SILVER 16FR (SET/KITS/TRAYS/PACK) ×4 IMPLANT
UNDERPAD 30X30 INCONTINENT (UNDERPADS AND DIAPERS) ×2 IMPLANT
WATER STERILE IRR 1500ML POUR (IV SOLUTION) ×2 IMPLANT
YANKAUER SUCT BULB TIP NO VENT (SUCTIONS) ×2 IMPLANT

## 2016-06-23 NOTE — H&P (View-Only) (Signed)
Follow-up Note Note: Gyn-Onc  Consult was initially requested by Dr. Elonda Husky for the evaluation of Briana Wiley 46 y.o. 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   Chief Complaint  Patient presents with  . VIN III  recurrent vulvar cancer   Assessment/Plan:  Briana Wiley  is a 46 y.o.  year old who has chronic paranoid schizophrenia, tobacco abuse and recurrent vulvar SCC s/p primary radiation (primary therapy completed February 13, 2014).  New recurrence of at least VIN3. Highly suspicious for recurrent vulvar cancer based on appearance. Unclear margins. Recommend surgical excision with right radical vulvectomy.   We discussed the surgery, its complications and risk profile and anticipated recovery. I discussed that infection and wound non-healing risk are substantially higher in patients with prior radiation and tobacco abuse.  She is not a candidate for additional radiation to the area. I will use the results of today's biopsies to guide the extent of the medial margins.  She firmly desires same day discharge from the hospital. I discussed the importance of minimal activity if she does this to minimize the likelihood of wound separation.  HPI: Briana Wiley is a 46 year old woman who was seen in consultation at the request of Dr. Elonda Husky for clinical stage II vulvar squamous cell carcinoma. 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 She also has a history significant for paranoid schizophrenia and resides in her care facility for this. She is an extremely heavy smoker and continues to smoke 1-2 packs per day. She began experiencing vulvar irritation in the past few months. She was seen by Dr. Elonda Husky for this. He recognized the posterior 5 cm raised erythematous vulva mass and performed a biopsy. Pathology revealed at least high-grade VIN associated with an area highly suspicious for squamous cell carcinoma.  PET  scan was performed which revealed positive inguinal and pelvic nodes.   She was treated with primary radiation with external beam radiation (45 gray to the pelvis and 55 gray with simulated integrated boost to the pelvic nodes). The patient then received a boost to the vulva area to a cumulative dose of 59.4 gray. Treatment dates were 12/26/13-02/13/14.  She tolerated therapy well with no issues.  Interval History: She has intermittent mild vulvar pain. Biopsy of vulva on 06/24/15 showed VIN III and she underwent CO2 laser with Dr. Denman George in April 2017. That was last time that she was seen by our service. She was seen by radiation oncology in December was noted to have a 1.5 cm ulcerative lesion on the labia and was subsequently sent in to see [place] today. She states that she believes that this lesion is a portal and a while was hurting and having some bleeding earlier is no longer hurting and she is no longer having any bleeding.  Biopsy on 04/27/16 with Dr Alycia Rossetti showed at least [place].   She failed to show for appointments after this biopsy. She reports increasing vulvar pain.  Current Meds:  Outpatient Encounter Prescriptions as of 06/03/2016  Medication Sig  . albuterol (PROVENTIL HFA;VENTOLIN HFA) 108 (90 BASE) MCG/ACT inhaler Inhale 2 puffs into the lungs every 4 (four) hours as needed for wheezing or shortness of breath.  . benzonatate (TESSALON) 100 MG capsule Take 1 capsule (100 mg total) by mouth 3 (three) times daily as needed for cough.  . benztropine (COGENTIN) 1 MG tablet Take 1 mg by mouth 2 (two) times daily.  Marland Kitchen  haloperidol (HALDOL) 5 MG tablet Take 5 mg by mouth at bedtime.  . haloperidol decanoate (HALDOL DECANOATE) 100 MG/ML injection Inject into the muscle every 28 (twenty-eight) days.  Marland Kitchen HYDROcodone-acetaminophen (NORCO/VICODIN) 5-325 MG tablet Take 1 tablet by mouth every 4 (four) hours as needed for moderate pain.  Marland Kitchen ibuprofen (ADVIL,MOTRIN) 400 MG tablet Take 400 mg by mouth every 6  (six) hours as needed.  . loperamide (IMODIUM A-D) 2 MG tablet Take 2 mg by mouth 4 (four) times daily as needed for diarrhea or loose stools. Reported on 09/24/2015  . naproxen (NAPROSYN) 250 MG tablet Take 1 tablet (250 mg total) by mouth 2 (two) times daily as needed for mild pain or moderate pain (take with food).  Marland Kitchen oxyCODONE-acetaminophen (PERCOCET) 10-325 MG tablet Take 1 tablet by mouth every 4 (four) hours as needed for pain.  . silver sulfADIAZINE (SILVADENE) 1 % cream Apply 1 application topically 2 (two) times daily. Reported on 09/24/2015  . solifenacin (VESICARE) 10 MG tablet Take by mouth daily.  . traZODone (DESYREL) 50 MG tablet Take 50 mg by mouth at bedtime. Takes 3 tabs at bedtime  . [DISCONTINUED] HYDROcodone-acetaminophen (NORCO/VICODIN) 5-325 MG tablet Take 1 tablet by mouth every 4 (four) hours as needed for moderate pain.   No facility-administered encounter medications on file as of 06/03/2016.     Allergy:  Allergies  Allergen Reactions  . Chicken Meat (Diagnostic) Nausea Only    Social Hx:   Social History   Social History  . Marital status: Legally Separated    Spouse name: N/A  . Number of children: 3  . Years of education: N/A   Occupational History  .  Unemployed   Social History Main Topics  . Smoking status: Current Every Day Smoker    Packs/day: 1.00    Years: 15.00    Types: Cigars, Cigarettes  . Smokeless tobacco: Former Systems developer    Types: Snuff, Chew  . Alcohol use No  . Drug use: No  . Sexual activity: No   Other Topics Concern  . Not on file   Social History Narrative  . No narrative on file    Past Surgical Hx:  Past Surgical History:  Procedure Laterality Date  . CO2 LASER APPLICATION N/A [id number]   Procedure: CO2 LASER OF THE VULVA;  Surgeon: [name], MD;  Location: [facility];  Service: Gynecology;  Laterality: N/A;  . DILATION AND CURETTAGE OF UTERUS  2009   w hysteroscopy/ ablation  . TUBAL LIGATION  Bilateral   . VULVA / PERINEUM BIOPSY  12/02/13  . VULVECTOMY PARTIAL  2003    Past Medical Hx:  Past Medical History:  Diagnosis Date  . Chronic schizophrenia ([place])   . Depression   . Radiation 12/26/13-02/13/14   45 gray to pelvis, pelvic nodes boosted to 55 gray, vulvar area boosted to 59.4 gray  . Trouble in sleeping   . Vulvar mass 11/07/2013  . Wears partial dentures     Past Gynecological History:  G3P3, SVD x3  No LMP recorded. Patient has had an ablation.  Family Hx:  Family History  Problem Relation Age of Onset  . Seizures Mother   . Hypertension Sister    Vitals:  Blood pressure (!) 114/92, pulse 86, temperature 98.2 F (36.8 C), temperature source Oral, resp. rate 18, height 5\' 6"  (1.676 m), weight 143 lb 6.4 oz (65 kg), SpO2 96 %.  Physical Exam: WD in NAD  Groins: Negative for lymphadenopathy.  Genito Urinary  Vulva/vagina:There are nodular hyperkeratotic areas of leukoplakia on the right towards the vagina. There is a 2 x1.5 cm raised lesion on the right vulva. Leukoplakia on right anterior and posterior(vaginal introitus) labia minora biopsied (see below).  PROCEDURE NOTE:  Verbal consent obtained. Time out performed. Vulvar prepped with betadine. 1% lidocaine (1cc) infiltrated into labia minora kevorkian biopsy forcep used to take pinch of tissue from anterior and posterior labia minora. Hemostasis achieved with silver nitrate. Specimens: 1/ posterior labia minora 2/ anterior labia minora to pathology EBL [id number] Complications: none Dispo: discharged to home stable    Donaciano Eva, MD   06/03/2016, 3:46 PM

## 2016-06-23 NOTE — Anesthesia Procedure Notes (Signed)
Procedure Name: LMA Insertion Date/Time: 06/23/2016 7:43 AM Performed by: Kordell Jafri, Virgel Gess Pre-anesthesia Checklist: Patient identified, Emergency Drugs available, Suction available and Patient being monitored Patient Re-evaluated:Patient Re-evaluated prior to inductionOxygen Delivery Method: Circle System Utilized Preoxygenation: Pre-oxygenation with 100% oxygen Intubation Type: IV induction Ventilation: Mask ventilation without difficulty LMA: LMA with gastric port inserted LMA Size: 4.0 Number of attempts: 1 Airway Equipment and Method: Bite block Placement Confirmation: positive ETCO2 Tube secured with: Tape Dental Injury: Teeth and Oropharynx as per pre-operative assessment

## 2016-06-23 NOTE — Interval H&P Note (Signed)
History and Physical Interval Note:  06/23/2016 7:15 AM  Briana Wiley  has presented today for surgery, with the diagnosis of VIN 3  The various methods of treatment have been discussed with the patient and family. After consideration of risks, benefits and other options for treatment, the patient has consented to  Procedure(s): RADICAL VULVECTOMY (Right) as a surgical intervention .  The patient's history has been reviewed, patient examined, no change in status, stable for surgery.  I have reviewed the patient's chart and labs.  Questions were answered to the patient's satisfaction.     Donaciano Eva

## 2016-06-23 NOTE — Op Note (Signed)
OPERATIVE NOTE  PATIENT: Briana Wiley DATE: 06/23/16   Preop Diagnosis: recurrent vulvar cancer  Postoperative Diagnosis: same  Surgery: Radical right vulvectomy  Surgeons:  Donaciano Eva, MD Assistant: none  Anesthesia: General  Estimated blood loss: <50 ml  IVF:  286ml   Urine output: 123XX123 ml   Complications: None   Pathology: right labia majora with marking stitch at 12 o'clock  Operative findings: 2cm circular raised lesion on right labia majora. VIN3 extending inferiorally to vaginal introitus.  Procedure: The patient was identified in the preoperative holding area. Informed consent was signed on the chart. Patient was seen history was reviewed and exam was performed.   The patient was then taken to the operating room and placed in the supine position with SCD hose on. General anesthesia was then induced without difficulty. She was then placed in the dorsolithotomy position. The perineum was prepped with Betadine. The vagina was prepped with Betadine. The patient was then draped after the prep was dried. A Foley catheter was inserted into the bladder under sterile conditions.  Timeout was performed the patient, procedure, antibiotic, allergy, and length of procedure. The vulvar tissues were inspected for areas of acetowhite changes or leukoplakia. The lesion was identified and the marking pen was used to circumscribe the area with appropriate surgical margins. The subcuticular tissues were infiltrated with 0.25% marcaine. The 15 blade scalpel was used to make an incision through the skin circumferentially as marked. Deep dissection took place to dissect to the underlying genital fascia as the deep margin. The skin elipse was grasped and was separated from the underlying deep dermal, fatty and fascial tissues with the bovie device and harmonic scalpel. After the specimen had been completely resected, it was oriented and marked at 12 o'clock with a 0-vicryl  suture. The bovie was used to obtain hemostasis at the surgical bed. The subcutaneous tissues were irrigated and made hemostatic.   The deep dermal layer was approximated with 2-0 and 3-0vicryl mattress sutures to bring the skin edges into approximation and off tension. The wound was closed following langher's lines. The cutaneous layer was closed with interrupted 4-0 vicryl stitches and mattress sutures to ensure a tension free and hemostatic closure. The perineum was again irrigated. The foley was removed.  All instrument, suture, laparotomy, Ray-Tec, and needle counts were correct x2. The patient tolerated the procedure well and was taken recovery room in stable condition. This is [name] dictating an operative note on [facility].  Donaciano Eva, MD

## 2016-06-23 NOTE — Transfer of Care (Signed)
Immediate Anesthesia Transfer of Care Note  Patient: Briana Wiley  Procedure(s) Performed: Procedure(s): RADICAL VULVECTOMY (Right)  Patient Location: PACU  Anesthesia Type:General  Level of Consciousness:  sedated, patient cooperative and responds to stimulation  Airway & Oxygen Therapy:Patient Spontanous Breathing and Patient connected to face mask oxgen  Post-op Assessment:  Report given to PACU RN and Post -op Vital signs reviewed and stable  Post vital signs:  Reviewed and stable  Last Vitals:  Vitals:   06/23/16 0520  BP: 113/67  Pulse: 67  Resp: 16  Temp: [id number]    Complications: No apparent anesthesia complications

## 2016-06-23 NOTE — Discharge Instructions (Signed)
Vulvectomy, Care After °The vulva is the external female genitalia, outside and around the vagina and pubic bone. It consists of: °· The skin on, and in front of, the pubic bone. °· The clitoris. °· The labia majora (large lips) on the outside of the vagina. °· The labia minora (small lips) around the opening of the vagina. °· The opening and the skin in and around the vagina. °A vulvectomy is the removal of the tissue of the vulva, which sometimes includes removal of the lymph nodes and tissue in the groin areas. °These discharge instructions provide you with general information on caring for yourself after you leave the hospital. It is also important that you know the warning signs of complications, so that you can seek treatment. Please read the instructions outlined below and refer to this sheet in the next few weeks. Your caregiver may also give you specific information and medicines. If you have any questions or complications after discharge, please call your caregiver. °ACTIVITY °· Rest as much as possible the first two weeks after discharge. °· Arrange to have help from family or others with your daily activities when you go home. °· Avoid heavy lifting (more than 5 pounds), pushing, or pulling. °· If you feel tired, balance your activity with rest periods. °· Follow your caregiver's instruction about climbing stairs and driving a car. °· Increase activity gradually. °· Do not exercise until you have permission from your caregiver. °LEG AND FOOT CARE °If your doctor has removed lymph nodes from your groin area, there may be an increase in swelling of your legs and feet. You can help prevent swelling by doing the following: °· Elevate your legs while sitting or lying down. °· If your caregiver has ordered special stockings, wear them according to instructions. °· Avoid standing in one place for long periods of time. °· Call the physical therapy department if you have any questions about swelling or treatment  for swelling. °· Avoid salt in your diet. It can cause fluid retention and swelling. °· Do not cross your legs, especially when sitting. °NUTRITION °· You may resume your normal diet. °· Drink 6 to 8 glasses of fluids a day. °· Eat a healthy, balanced diet including portions of food from the meat (protein), milk, fruit, vegetable, and bread groups. °· Your caregiver may recommend you take a multivitamin with iron. °ELIMINATION °· You may notice that your stream of urine is at a different angle, and may tend to spray. Using a plastic funnel may help to decrease urine spray. °· If constipation occurs, drink more liquids, and add more fruits, vegetables, and bran to your diet. You may take a mild laxative, such as Milk of Magnesia, Metamucil, or a stool softener such as Colace, with permission from your caregiver. °HYGIENE °· You may shower and wash your hair. °· Check with your caregiver about tub baths. °· Do not add any bath oils or chemicals to your bath water, after you have permission to take baths. °· While passing urine, pour water from a bottle or spray over your vulva to dilute the urine as it passes the incision (this will decrease burning and discomfort). °· Clean yourself well after moving your bowels. °· After urinating, do not wipe. Dap or pat dry with toilet paper or a dry cleath soft cloth. °· A sitz bath will help keep your perineal area clean, reduce swelling, and provide comfort. °· Avoid wearing underpants for the first 2 weeks and wear loose skirts to   to allow circulation of air around the incision °· You do not need to apply dressings, salves or lotions to the wound. °· The stitches are self-dissolving and will absorb and disappear over a couple of months (it is normal to notice the knot from the stitches on toilet paper after voiding). °HOME CARE INSTRUCTIONS  °· Apply a soft ice pack (or frozen bag of peas) to your perineum (vulva) every hour in the first 48 hours after surgery. This  will reduce swelling. °· Avoid activities that involve a lot of friction between your legs. °· Avoid wearing pants or underpants in the 1st 2 weeks (skirts are preferable). °· Take your temperature twice a day and record it, especially if you feel feverish or have chills. °· Follow your caregiver's instructions about medicines, activity, and follow-up appointments after surgery. °· Do not drink alcohol while taking pain medicine. °· Change your dressing as advised by your caregiver. °· You may take over-the-counter medicine for pain, recommended by your caregiver. °· If your pain is not relieved with medicine, call your caregiver. °· Do not take aspirin because it can cause bleeding. °· Do not douche or use tampons (use a nonperfumed sanitary pad). °· Do not have sexual intercourse until your caregiver gives you permission (typically 6 weeks postoperatively). Hugging, kissing, and playful sexual activity is fine with your caregiver's permission. °· Warm sitz baths, with your caregiver's permission, are helpful to control swelling and discomfort. °· Take showers instead of baths, until your caregiver gives you permission to take baths. °· You may take a mild medicine for constipation, recommended by your caregiver. Bran foods and drinking a lot of fluids will help with constipation. °· Make sure your family understands everything about your operation and recovery. °SEEK MEDICAL CARE IF:  °· You notice swelling and redness around the wound area. °· You notice a foul smell coming from the wound or on the surgical dressing. °· You notice the wound is separating. °· You have painful or bloody urination. °· You develop nausea and vomiting. °· You develop diarrhea. °· You develop a rash. °· You have a reaction or allergy from the medicine. °· You feel dizzy or light-headed. °· You need stronger pain medicine. °SEEK IMMEDIATE MEDICAL CARE IF:  °· You develop a temperature of 102° F (38.9° C) or higher. °· You pass  out. °· You develop leg or chest pain. °· You develop abdominal pain. °· You develop shortness of breath. °· You develop bleeding from the wound area. °· You see pus in the wound area. °MAKE SURE YOU:  °· Understand these instructions. °· Will watch your condition. °· Will get help right away if you are not doing well or get worse. °Document Released: 11/24/2003 Document Revised: 08/26/2013 Document Reviewed: 03/13/2009 °ExitCare® Patient Information ©2015 [employer]. This information is not intended to replace advice given to you by your health care provider. Make sure you discuss any questions you have with your health care provider. °

## 2016-06-23 NOTE — Anesthesia Postprocedure Evaluation (Signed)
Anesthesia Post Note  Patient: Briana Wiley  Procedure(s) Performed: Procedure(s) (LRB): RADICAL VULVECTOMY (Right)  Patient location during evaluation: PACU Anesthesia Type: General Level of consciousness: awake and alert and oriented Pain management: pain level controlled Vital Signs Assessment: post-procedure vital signs reviewed and stable Respiratory status: spontaneous breathing, nonlabored ventilation and respiratory function stable Cardiovascular status: blood pressure returned to baseline and stable Postop Assessment: no signs of nausea or vomiting Anesthetic complications: no       Last Vitals:  Vitals:   06/23/16 0908 06/23/16 0915  BP:  135/90  Pulse: 82 85  Resp: (!) 22 14  Temp:      Last Pain:  Vitals:   06/23/16 0908  TempSrc:   PainSc: 2                  Makell Cyr A.

## 2016-07-04 ENCOUNTER — Telehealth: Payer: Self-pay

## 2016-07-04 NOTE — Telephone Encounter (Signed)
Pt aware of test results

## 2016-07-04 NOTE — Telephone Encounter (Signed)
Pt's case worker, [name], notified of path results and post op appt on 3/15

## 2016-07-07 ENCOUNTER — Ambulatory Visit: Payer: Medicare Other | Admitting: Gynecologic Oncology

## 2016-07-07 ENCOUNTER — Telehealth: Payer: Self-pay | Admitting: *Deleted

## 2016-07-07 NOTE — Telephone Encounter (Signed)
Pt no show appt today./ Called home # spoke with care taker Helene Kelp who advised pt is not here call Mrs Huel Cote her guardian. Called Mrs Huel Cote lmovm asking where pt is, if needs to reschedule or are they late. Request call back with additional information.

## 2016-07-11 ENCOUNTER — Ambulatory Visit: Payer: Medicare Other | Attending: Gynecologic Oncology | Admitting: Gynecologic Oncology

## 2016-07-11 ENCOUNTER — Encounter: Payer: Self-pay | Admitting: Gynecologic Oncology

## 2016-07-11 VITALS — BP 109/63 | HR 68 | Temp 98.3°F | Resp 18 | Ht 66.0 in | Wt 143.0 lb

## 2016-07-11 DIAGNOSIS — Z720 Tobacco use: Secondary | ICD-10-CM | POA: Diagnosis not present

## 2016-07-11 DIAGNOSIS — Z9889 Other specified postprocedural states: Secondary | ICD-10-CM | POA: Insufficient documentation

## 2016-07-11 DIAGNOSIS — Z79899 Other long term (current) drug therapy: Secondary | ICD-10-CM | POA: Diagnosis not present

## 2016-07-11 DIAGNOSIS — F200 Paranoid schizophrenia: Secondary | ICD-10-CM | POA: Insufficient documentation

## 2016-07-11 DIAGNOSIS — C519 Malignant neoplasm of vulva, unspecified: Secondary | ICD-10-CM | POA: Insufficient documentation

## 2016-07-11 DIAGNOSIS — F17210 Nicotine dependence, cigarettes, uncomplicated: Secondary | ICD-10-CM | POA: Insufficient documentation

## 2016-07-11 DIAGNOSIS — N762 Acute vulvitis: Secondary | ICD-10-CM | POA: Insufficient documentation

## 2016-07-11 MED ORDER — OXYCODONE-ACETAMINOPHEN 10-325 MG PO TABS: 1.0000 | ORAL_TABLET | ORAL | 0 refills | Status: DC | PRN

## 2016-07-11 MED ORDER — CEPHALEXIN 500 MG PO CAPS: 500.0000 mg | ORAL_CAPSULE | Freq: Four times a day (QID) | ORAL | 1 refills | Status: AC

## 2016-07-11 NOTE — Patient Instructions (Addendum)
Your surgical site has still not healed. Dr Denman George has given you an antibiotic medication to take.  This medicine will help with the infection that you have at the surgical site.  Continue to clean the surgical site as you have been. And follow up with Dr Denman George, in 2 weeks.   It is important that you take the antibiotic that Dr Denman George has prescribed for you.

## 2016-07-11 NOTE — Progress Notes (Signed)
Follow-up Note Note: Gyn-Onc  Consult was initially requested by Dr. Elonda Husky for the evaluation of Briana Wiley 46 y.o. 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   Chief Complaint  Patient presents with  . Vulvar Cancer    post op check  recurrent vulvar cancer   Assessment/Plan:  [name]  is a 46 y.o.  year old who has chronic paranoid schizophrenia, tobacco abuse and recurrent vulvar SCC s/p primary radiation (primary therapy completed February 13, 2014).  s/p right radical vulvectomy.   Wound separation and cellulitis of right vulva. Without signs of systemic infection and without erysipelas.   I counseled Bryn that this had happened likely due to poor wound healing capabilities in a radiated region, and in the setting of heavy tobacco use. Recommended continued wound cleaning efforts with regular sitz baths. Recommended monitoring for signs of progressive infection - spreading redness and warmth, increased drainage, increased pain, fever and chills. Recommended she return in 2 weeks for re-evaluation of wound or present sooner should she have the above symptoms. Have prescribed Keflex PO 500mg  QID x 2 weeks. Re-prescribed percocet.  HPI: Briana Wiley is a 46 year old woman who was seen in consultation at the request of Dr. Elonda Husky for clinical stage II vulvar squamous cell carcinoma. 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 She also has a history significant for paranoid schizophrenia and resides in her care facility for this. She is an extremely heavy smoker and continues to smoke 1-2 packs per day. She began experiencing vulvar irritation in the past few months. She was seen by Dr. Elonda Husky for this. He recognized the posterior 5 cm raised erythematous vulva mass and performed a biopsy. Pathology revealed at least high-grade VIN associated with an area highly suspicious for squamous cell carcinoma.  PET  scan was performed which revealed positive inguinal and pelvic nodes.   She was treated with primary radiation with external beam radiation (45 gray to the pelvis and 55 gray with simulated integrated boost to the pelvic nodes). The patient then received a boost to the vulva area to a cumulative dose of 59.4 gray. Treatment dates were 12/26/13-02/13/14. She tolerated therapy well with no issues or delays.  She developed intermittent mild vulvar pain. Biopsy of vulva on 06/24/15 showed VIN III and she underwent CO2 laser with Dr. Denman George in April 2017. That was last time that she was seen by our service. She was seen by radiation oncology in December was noted to have a 1.5 cm ulcerative lesion on the labia and was subsequently sent in to see [place] today. She states that she believes that this lesion is a portal and a while was hurting and having some bleeding earlier is no longer hurting and she is no longer having any bleeding.  Biopsy on 04/27/16 with Dr Alycia Rossetti showed at least [place].   She failed to show for appointments after this biopsy. She reported increasing vulvar pain.  Interval Hx: On 06/23/16 she was able to be scheduled for a radical right vulvectomy. Pathology confirmed invasive SCC associated with VIN III. VIN III extended to the medial and inferior margin, however the margins were negative for SCC.  Postoperatively she failed to appear for her scheduled post-op visit. She has experienced progressive pain and drainage. She reports being compliant with wound care and sitz baths.  Current Meds:  Outpatient Encounter Prescriptions as of 07/11/2016  Medication Sig  .  albuterol (PROVENTIL HFA;VENTOLIN HFA) 108 (90 BASE) MCG/ACT inhaler Inhale 2 puffs into the lungs every 4 (four) hours as needed for wheezing or shortness of breath.  . benzonatate (TESSALON) 100 MG capsule Take 1 capsule (100 mg total) by mouth 3 (three) times daily as needed for cough.  . benztropine (COGENTIN) 1 MG tablet Take 1 mg  by mouth 2 (two) times daily.  . haloperidol (HALDOL) 5 MG tablet Take 5 mg by mouth at bedtime.  . haloperidol decanoate (HALDOL DECANOATE) 100 MG/ML injection Inject into the muscle every 28 (twenty-eight) days.  Marland Kitchen HYDROcodone-acetaminophen (NORCO/VICODIN) 5-325 MG tablet Take 1 tablet by mouth every 6 (six) hours as needed for moderate pain.  Marland Kitchen ibuprofen (ADVIL,MOTRIN) 400 MG tablet Take 400 mg by mouth every 6 (six) hours as needed.  . loperamide (IMODIUM A-D) 2 MG tablet Take 2 mg by mouth 4 (four) times daily as needed for diarrhea or loose stools. Reported on 09/24/2015  . naproxen (NAPROSYN) 250 MG tablet Take 1 tablet (250 mg total) by mouth 2 (two) times daily as needed for mild pain or moderate pain (take with food).  Marland Kitchen oxyCODONE-acetaminophen (PERCOCET) 10-325 MG tablet Take 1 tablet by mouth every 4 (four) hours as needed for pain.  . silver sulfADIAZINE (SILVADENE) 1 % cream Apply 1 application topically 2 (two) times daily. Reported on 09/24/2015  . solifenacin (VESICARE) 10 MG tablet Take by mouth daily.  . traZODone (DESYREL) 50 MG tablet Take 50 mg by mouth at bedtime. Takes 3 tabs at bedtime  . [DISCONTINUED] oxyCODONE-acetaminophen (PERCOCET) 10-325 MG tablet Take 1 tablet by mouth every 4 (four) hours as needed for pain.  . cephALEXin (KEFLEX) 500 MG capsule Take 1 capsule (500 mg total) by mouth 4 (four) times daily.   No facility-administered encounter medications on file as of 07/11/2016.     Allergy:  Allergies  Allergen Reactions  . Chicken Meat (Diagnostic) Nausea Only    Social Hx:   Social History   Social History  . Marital status: Single    Spouse name: N/A  . Number of children: 3  . Years of education: N/A   Occupational History  .  Unemployed   Social History Main Topics  . Smoking status: Current Every Day Smoker    Packs/day: 0.50    Years: 30.00    Types: Cigars, Cigarettes  . Smokeless tobacco: Former Systems developer    Types: Snuff, Chew  . Alcohol use  No  . Drug use: No  . Sexual activity: No   Other Topics Concern  . Not on file   Social History Narrative  . No narrative on file    Past Surgical Hx:  Past Surgical History:  Procedure Laterality Date  . CO2 LASER APPLICATION N/A 09/26/1495   Procedure: CO2 LASER OF THE VULVA;  Surgeon: [name], MD;  Location: [facility];  Service: Gynecology;  Laterality: N/A;  . DILATION AND CURETTAGE OF UTERUS  2009   w hysteroscopy/ ablation  . RADICAL VULVECTOMY Right 06/23/2016   Procedure: RADICAL VULVECTOMY;  Surgeon: [name], MD;  Location: [facility];  Service: Gynecology;  Laterality: Right;  . TUBAL LIGATION Bilateral   . VULVA / PERINEUM BIOPSY  12/02/13  . VULVECTOMY PARTIAL  2003    Past Medical Hx:  Past Medical History:  Diagnosis Date  . Chronic schizophrenia ([place])   . Depression   . Dyspnea    with exertion   . Headache   . Hypertension  patient reports but not on meds   . Radiation 12/26/13-02/13/14   45 gray to pelvis, pelvic nodes boosted to 55 gray, vulvar area boosted to 59.4 gray  . Trouble in sleeping   . Vulvar mass 11/07/2013  . Wears partial dentures     Past Gynecological History:  G3P3, SVD x3  No LMP recorded. Patient has had an ablation.  Family Hx:  Family History  Problem Relation Age of Onset  . Seizures Mother   . Hypertension Sister    Vitals:  Blood pressure 109/63, pulse 68, temperature 98.3 F (36.8 C), temperature source Oral, resp. rate 18, height 5\' 6"  (1.676 m), weight 143 lb (64.9 kg), SpO2 99 %.  Physical Exam: WD in NAD  Groins: Negative for lymphadenopathy.  Genito Urinary  Vulva/vagina: incision on right vulva superficially open with base including necrotic tissue  - debrided bluntly. No underlying fluctuance or undrained collections. Surrounding vulva midly edematous. No erysipelas. Mildly erythematous skin of labia majora on right. Tender to touch.    30 minutes of direct face to face counseling time was  spent with the patient. This included discussion about prognosis, therapy recommendations and postoperative side effects and are beyond the scope of routine postoperative care.  Donaciano Eva, MD   07/11/2016, 12:37 PM

## 2016-07-14 ENCOUNTER — Ambulatory Visit
Admission: RE | Admit: 2016-07-14 | Discharge: 2016-07-14 | Disposition: A | Discharge status: Home / Self Care | Payer: Medicare Other | Source: Ambulatory Visit | Attending: Radiation Oncology | Admitting: Radiation Oncology

## 2016-07-14 DIAGNOSIS — Z79899 Other long term (current) drug therapy: Secondary | ICD-10-CM | POA: Insufficient documentation

## 2016-07-14 DIAGNOSIS — Z8544 Personal history of malignant neoplasm of other female genital organs: Secondary | ICD-10-CM | POA: Diagnosis not present

## 2016-07-14 DIAGNOSIS — C519 Malignant neoplasm of vulva, unspecified: Secondary | ICD-10-CM | POA: Diagnosis present

## 2016-07-14 DIAGNOSIS — D071 Carcinoma in situ of vulva: Secondary | ICD-10-CM | POA: Diagnosis not present

## 2016-07-14 DIAGNOSIS — Z08 Encounter for follow-up examination after completed treatment for malignant neoplasm: Secondary | ICD-10-CM | POA: Diagnosis not present

## 2016-07-14 DIAGNOSIS — F17210 Nicotine dependence, cigarettes, uncomplicated: Secondary | ICD-10-CM | POA: Diagnosis not present

## 2016-07-14 DIAGNOSIS — Z923 Personal history of irradiation: Secondary | ICD-10-CM | POA: Diagnosis not present

## 2016-07-14 NOTE — Progress Notes (Signed)
Radiation Oncology         [phone] ________________________________  Name: Briana Wiley MRN: [id number]  Date: 07/14/2016  DOB: 14-Aug-1970  Follow-Up Visit Note  CC: [name], MD  [name], MD    ICD-9-CM ICD-10-CM   1. Vulvar cancer [facility]) 184.4 C51.9     Diagnosis: Recurrent squamous cell carcinoma of the right vulva  Interval Since Last Radiation:  2 years and 5 months  12/26/13-02/13/14: Initial treatment was 45 gray to the pelvis. The PET positive inguinal and pelvic nodes were boosted to 55 gray with a simultaneous integrated boost.   After completion of this treatment the patient proceeded with a boost to the vulvar area to a cumulative dose of 59.4 gray to this area.  Narrative:  Briana Wiley presents to clinic for a routine follow-up appointment. After the patient's last follow up on 04/11/16, she saw Dr. Alycia Rossetti on 04/27/16 for concern of possible recurrence. Dr. Alycia Rossetti took a punch biopsy of the right vulvar lesion. This revealed high grade vulvar inraepithelial neoplasia (VIN-III, severe dysplasia/CIS) with foci suspicious for invasive squamous cell carcinoma. The patient saw Dr. Denman George on 06/03/16 who recommended surgical excision with a right radical vulvectomy. The patient proceeded with surgery on 06/23/16. Pathology of the right labia majora revealed grade 2 invasive squamous cell carcinoma (measuring 2 cm) associated with high grade vulvar intraepithelial neoplasia (VIN-III), margins not involved by invasive carcinoma, VIN-III involved the 3:00 edge and the 6:00 tip. Depth of stromal invasion was 0.6 cm. (pT1b, pNX) The patient followed up with Dr. Denman George on 07/11/16 who noted wound separation and cellulitis of the right vulva without signs of systemic infection and without erysipelas. Dr. Denman George recommended sitz baths for wound healing and she prescribed Keflex PO 500mg  QID for 2 weeks.  The patient complains of pain in the surgical area. She reports pus in that  area. She has a decreased appetite and fatigue. She denies diarrhea, constipation, or incontinence.  ALLERGIES:  is allergic to chicken meat (diagnostic).  Meds: Current Outpatient Prescriptions  Medication Sig Dispense Refill  . albuterol (PROVENTIL HFA;VENTOLIN HFA) 108 (90 BASE) MCG/ACT inhaler Inhale 2 puffs into the lungs every 4 (four) hours as needed for wheezing or shortness of breath. 1 Inhaler 0  . benzonatate (TESSALON) 100 MG capsule Take 1 capsule (100 mg total) by mouth 3 (three) times daily as needed for cough. 15 capsule 0  . benztropine (COGENTIN) 1 MG tablet Take 1 mg by mouth 2 (two) times daily.    . cephALEXin (KEFLEX) 500 MG capsule Take 1 capsule (500 mg total) by mouth 4 (four) times daily. 60 capsule 1  . haloperidol (HALDOL) 5 MG tablet Take 5 mg by mouth at bedtime.    . haloperidol decanoate (HALDOL DECANOATE) 100 MG/ML injection Inject into the muscle every 28 (twenty-eight) days.    Marland Kitchen ibuprofen (ADVIL,MOTRIN) 400 MG tablet Take 400 mg by mouth every 6 (six) hours as needed.    . naproxen (NAPROSYN) 250 MG tablet Take 1 tablet (250 mg total) by mouth 2 (two) times daily as needed for mild pain or moderate pain (take with food). 14 tablet 0  . silver sulfADIAZINE (SILVADENE) 1 % cream Apply 1 application topically 2 (two) times daily. Reported on 09/24/2015    . solifenacin (VESICARE) 10 MG tablet Take by mouth daily.    . traZODone (DESYREL) 50 MG tablet Take 50 mg by mouth at bedtime. Takes 3 tabs at bedtime    . HYDROcodone-acetaminophen (NORCO/VICODIN)  5-325 MG tablet Take 1 tablet by mouth every 6 (six) hours as needed for moderate pain. (Patient not taking: Reported on 07/14/2016) 30 tablet 0  . loperamide (IMODIUM A-D) 2 MG tablet Take 2 mg by mouth 4 (four) times daily as needed for diarrhea or loose stools. Reported on 09/24/2015    . oxyCODONE-acetaminophen (PERCOCET) 10-325 MG tablet Take 1 tablet by mouth every 4 (four) hours as needed for pain. (Patient not  taking: Reported on 07/14/2016) 30 tablet 0   No current facility-administered medications for this encounter.     Physical Findings: The patient is in no acute distress. Patient is alert and oriented.  weight is 143 lb 3.2 oz (65 kg). Her oral temperature is 98.3 F (36.8 C). Her blood pressure is 152/90 (abnormal) and her pulse is 94. Her respiration is 20 and oxygen saturation is 100%.   Lungs are clear to auscultation bilaterally. Heart has regular rate and rhythm. No palpable cervical, supraclavicular, or axillary adenopathy. Abdomen soft, non-tender, normal bowel sounds. No palpable inguinal adenopathy.  Pelvis exam: Right labia majora surgically absent. Sutures in place. Slight separation anteriorly. Detailed exam not performed in light of recent surgery   Lab Findings: Lab Results  Component Value Date   WBC 7.5 06/22/2016   HGB 14.6 06/22/2016   HCT 42.6 06/22/2016   MCV 89.5 06/22/2016   PLT 156 06/22/2016    Radiographic Findings: No results found.  Impression: Recurrent squamous cell carcinoma of the right vulva  The patient underwent a right radical vulvectomy on 06/23/16. Pathology of the right labia majora revealed grade 2 invasive squamous cell carcinoma associated with high grade vulvar intraepithelial neoplasia (VIN-III). The margins were not involved by invasive carcinoma.  The patient has been placed on antibiotics for cellulitis. I recommended she continue Keflex. She has no major symptoms from the medicine beside bowel frequency, but no diarrhea. I recommend close follow up with Dr. Denman George to ensure complete healing and for her to decrease her cigarette use. She reports only smoking 10 per day. From a radiation standpoint, the patient has clear margins from surgery (except for VIN) and she has received a full dose of radiation. She would not be a candidate for additional radiation.  Plan: The patient is scheduled to follow up with Dr. Denman George on 07/27/16 for  re-evaluation of wound healing. The patient will follow up with me on a PRN basis. ____________________________________   Blair Promise, PhD, MD  This document serves as a record of services personally performed by Gery Pray, MD. It was created on his behalf by Darcus Austin, a trained medical scribe. The creation of this record is based on the scribe's personal observations and the provider's statements to them. This document has been checked and approved by the attending provider.

## 2016-07-14 NOTE — Progress Notes (Addendum)
Briana Wiley here today for followup of vulvar squamous cell carcinoma.  She had surgery in March 2018 by Dr. Denman George for [place].  Patient complains of pain from this rating 5 out of 10.  She states she is taking Ibuprofen and/or Naproxen for the pain.  Patient is also on antibiotic Keflex for infection that started up after surgery.  Patient states that has pus from surgical site, Dr. Denman George is aware and instructed to keep clean, dry and complete antibiotic.  Patient states she has noticed some spotting, unsure if from urine or surgical site.  Patient states her appetite has decreased.  Patient also states that she is having a great deal of fatigue.  She says she is still working.  Patient denies any diarrhea or constipation.  Denies any incontinence.    Vitals:   07/14/16 0846  BP: (!) 152/90  Pulse: 94  Resp: 20  Temp: 98.3 F (36.8 C)  TempSrc: Oral  SpO2: 100%  Weight: 143 lb 3.2 oz (65 kg)    Wt Readings from Last 3 Encounters:  07/14/16 143 lb 3.2 oz (65 kg)  07/11/16 143 lb (64.9 kg)  06/23/16 145 lb (65.8 kg)

## 2016-07-20 DIAGNOSIS — F209 Schizophrenia, unspecified: Secondary | ICD-10-CM | POA: Diagnosis not present

## 2016-07-20 DIAGNOSIS — F17200 Nicotine dependence, unspecified, uncomplicated: Secondary | ICD-10-CM | POA: Diagnosis not present

## 2016-07-20 DIAGNOSIS — R32 Unspecified urinary incontinence: Secondary | ICD-10-CM | POA: Diagnosis not present

## 2016-07-20 DIAGNOSIS — J410 Simple chronic bronchitis: Secondary | ICD-10-CM | POA: Diagnosis not present

## 2016-07-26 NOTE — Progress Notes (Signed)
Follow-up Note Note: Gyn-Onc  Consult was initially requested by Dr. Elonda Wiley for the evaluation of Briana Wiley 46 y.o. 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   Chief Complaint  Patient presents with  . Vulvar Cancer  recurrent vulvar cancer   Assessment/Plan:  [name]  is a 46 y.o.  year old who has chronic paranoid schizophrenia, tobacco abuse and recurrent vulvar SCC s/p primary radiation (primary therapy completed February 13, 2014).  s/p right radical vulvectomy.   Wound separation and cellulitis of right vulva. Without signs of systemic infection and without erysipelas.   Infection resolved. Incision healing by secondary intention.  Plan on follow-up in 3 months for cancer surveillance.  HPI: Briana Wiley is a 46 year old woman who was seen in consultation at the request of Dr. Elonda Wiley for clinical stage II vulvar squamous cell carcinoma. 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 She also has a history significant for paranoid schizophrenia and resides in her care facility for this. She is an extremely heavy smoker and continues to smoke 1-2 packs per day. She began experiencing vulvar irritation in the past few months. She was seen by Dr. Elonda Wiley for this. He recognized the posterior 5 cm raised erythematous vulva mass and performed a biopsy. Pathology revealed at least high-grade VIN associated with an area highly suspicious for squamous cell carcinoma.  PET scan was performed which revealed positive inguinal and pelvic nodes.   She was treated with primary radiation with external beam radiation (45 gray to the pelvis and 55 gray with simulated integrated boost to the pelvic nodes). The patient then received a boost to the vulva area to a cumulative dose of 59.4 gray. Treatment dates were 12/26/13-02/13/14. She tolerated therapy well with no issues or delays.  She developed intermittent mild vulvar  pain. Biopsy of vulva on 06/24/15 showed VIN III and she underwent CO2 laser with Dr. Denman Wiley in April 2017. That was last time that she was seen by our service. She was seen by radiation oncology in December was noted to have a 1.5 cm ulcerative lesion on the labia and was subsequently sent in to see [place] today. She states that she believes that this lesion is a portal and a while was hurting and having some bleeding earlier is no longer hurting and she is no longer having any bleeding.  Biopsy on 04/27/16 with Dr Briana Wiley showed at least [place].   She failed to show for appointments after this biopsy. She reported increasing vulvar pain.  On 06/23/16 she was able to be scheduled for a radical right vulvectomy. Pathology confirmed invasive SCC associated with VIN III. VIN III extended to the medial and inferior margin, however the margins were negative for SCC.  Interval Hx: Postoperatively she failed to appear for her scheduled post-op visit, however, when she did return, she demonstrated wound separation and cellulitis. It was treated with a course of keflex x 2 weeks.  Since starting antibiotics the patient has felt much better with decreased pain and drainage. She has no concerns or complaints. She took the full course of antibiotics.  Current Meds:  Outpatient Encounter Prescriptions as of 07/27/2016  Medication Sig  . albuterol (PROVENTIL HFA;VENTOLIN HFA) 108 (90 BASE) MCG/ACT inhaler Inhale 2 puffs into the lungs every 4 (four) hours as needed for wheezing or shortness of breath.  . benzonatate (TESSALON) 100 MG capsule Take 1 capsule (100 mg total)  by mouth 3 (three) times daily as needed for cough.  . benztropine (COGENTIN) 1 MG tablet Take 1 mg by mouth 2 (two) times daily.  . haloperidol (HALDOL) 5 MG tablet Take 5 mg by mouth at bedtime.  . haloperidol decanoate (HALDOL DECANOATE) 100 MG/ML injection Inject into the muscle every 28 (twenty-eight) days.  Marland Kitchen HYDROcodone-acetaminophen (NORCO/VICODIN)  5-325 MG tablet Take 1 tablet by mouth every 6 (six) hours as needed for moderate pain.  Marland Kitchen ibuprofen (ADVIL,MOTRIN) 400 MG tablet Take 400 mg by mouth every 6 (six) hours as needed.  . loperamide (IMODIUM A-D) 2 MG tablet Take 2 mg by mouth 4 (four) times daily as needed for diarrhea or loose stools. Reported on 09/24/2015  . naproxen (NAPROSYN) 250 MG tablet Take 1 tablet (250 mg total) by mouth 2 (two) times daily as needed for mild pain or moderate pain (take with food).  Marland Kitchen oxyCODONE-acetaminophen (PERCOCET) 10-325 MG tablet Take 1 tablet by mouth every 4 (four) hours as needed for pain.  . silver sulfADIAZINE (SILVADENE) 1 % cream Apply 1 application topically 2 (two) times daily. Reported on 09/24/2015  . solifenacin (VESICARE) 10 MG tablet Take by mouth daily.  . traZODone (DESYREL) 50 MG tablet Take 50 mg by mouth at bedtime. Takes 3 tabs at bedtime  . [DISCONTINUED] HYDROcodone-acetaminophen (NORCO/VICODIN) 5-325 MG tablet Take 1 tablet by mouth every 6 (six) hours as needed for moderate pain.   No facility-administered encounter medications on file as of 07/27/2016.     Allergy:  Allergies  Allergen Reactions  . Chicken Meat (Diagnostic) Nausea Only    Social Hx:   Social History   Social History  . Marital status: Single    Spouse name: N/A  . Number of children: 3  . Years of education: N/A   Occupational History  .  Unemployed   Social History Main Topics  . Smoking status: Current Every Day Smoker    Packs/day: 0.50    Years: 30.00    Types: Cigars, Cigarettes  . Smokeless tobacco: Former Systems developer    Types: Snuff, Chew  . Alcohol use No  . Drug use: No  . Sexual activity: No   Other Topics Concern  . Not on file   Social History Narrative  . No narrative on file    Past Surgical Hx:  Past Surgical History:  Procedure Laterality Date  . CO2 LASER APPLICATION N/A 0/11/6576   Procedure: CO2 LASER OF THE VULVA;  Surgeon: [name], MD;  Location: [facility];  Service: Gynecology;  Laterality: N/A;  . DILATION AND CURETTAGE OF UTERUS  2009   w hysteroscopy/ ablation  . RADICAL VULVECTOMY Right 06/23/2016   Procedure: RADICAL VULVECTOMY;  Surgeon: [name], MD;  Location: [facility];  Service: Gynecology;  Laterality: Right;  . TUBAL LIGATION Bilateral   . VULVA / PERINEUM BIOPSY  12/02/13  . VULVECTOMY PARTIAL  2003    Past Medical Hx:  Past Medical History:  Diagnosis Date  . Chronic schizophrenia ([place])   . Depression   . Dyspnea    with exertion   . Headache   . Hypertension    patient reports but not on meds   . Radiation 12/26/13-02/13/14   45 gray to pelvis, pelvic nodes boosted to 55 gray, vulvar area boosted to 59.4 gray  . Trouble in sleeping   . Vulvar mass 11/07/2013  . Wears partial dentures     Past Gynecological History:  G3P3, SVD x3  No LMP recorded. Patient has had an ablation.  Family Hx:  Family History  Problem Relation Age of Onset  . Seizures Mother   . Hypertension Sister    Vitals:  Blood pressure 110/74, pulse 70, temperature 98.6 F (37 C), temperature source Oral, resp. rate 20, weight 143 lb 1.6 oz (64.9 kg).  Physical Exam: WD in NAD  Groins: Negative for lymphadenopathy.  Genito Urinary  Vulva/vagina: incision on right vulva superficially open without signs of active infection.    Donaciano Eva, MD   07/27/2016, 9:55 AM

## 2016-07-27 ENCOUNTER — Encounter: Payer: Self-pay | Admitting: Gynecologic Oncology

## 2016-07-27 ENCOUNTER — Ambulatory Visit: Payer: Medicare Other | Attending: Gynecologic Oncology | Admitting: Gynecologic Oncology

## 2016-07-27 VITALS — BP 110/74 | HR 70 | Temp 98.6°F | Resp 20 | Wt 143.1 lb

## 2016-07-27 DIAGNOSIS — C519 Malignant neoplasm of vulva, unspecified: Secondary | ICD-10-CM | POA: Diagnosis not present

## 2016-07-27 DIAGNOSIS — F200 Paranoid schizophrenia: Secondary | ICD-10-CM | POA: Insufficient documentation

## 2016-07-27 DIAGNOSIS — F17210 Nicotine dependence, cigarettes, uncomplicated: Secondary | ICD-10-CM | POA: Diagnosis not present

## 2016-07-27 DIAGNOSIS — Z720 Tobacco use: Secondary | ICD-10-CM

## 2016-07-27 DIAGNOSIS — Z79899 Other long term (current) drug therapy: Secondary | ICD-10-CM | POA: Insufficient documentation

## 2016-07-27 MED ORDER — HYDROCODONE-ACETAMINOPHEN 5-325 MG PO TABS
1.0000 | ORAL_TABLET | Freq: Four times a day (QID) | ORAL | 0 refills | Status: DC | PRN
Start: 2016-07-27 — End: 2017-06-05

## 2016-07-27 NOTE — Patient Instructions (Signed)
Continue to rinse in epsom salt baths. Continue to rinse area with water after toileting.  You do not require additional antibiotics.  We have refilled your prescriptions.

## 2016-08-10 DIAGNOSIS — F209 Schizophrenia, unspecified: Secondary | ICD-10-CM | POA: Diagnosis not present

## 2016-09-28 ENCOUNTER — Ambulatory Visit: Payer: Medicare Other | Attending: Gynecologic Oncology | Admitting: Gynecologic Oncology

## 2016-09-28 ENCOUNTER — Encounter: Payer: Self-pay | Admitting: Gynecologic Oncology

## 2016-09-28 VITALS — BP 113/80 | HR 60 | Temp 97.9°F | Resp 20 | Wt 144.5 lb

## 2016-09-28 DIAGNOSIS — N904 Leukoplakia of vulva: Secondary | ICD-10-CM

## 2016-09-28 DIAGNOSIS — Z923 Personal history of irradiation: Secondary | ICD-10-CM | POA: Diagnosis not present

## 2016-09-28 DIAGNOSIS — F329 Major depressive disorder, single episode, unspecified: Secondary | ICD-10-CM | POA: Insufficient documentation

## 2016-09-28 DIAGNOSIS — C519 Malignant neoplasm of vulva, unspecified: Secondary | ICD-10-CM | POA: Diagnosis present

## 2016-09-28 DIAGNOSIS — Z79899 Other long term (current) drug therapy: Secondary | ICD-10-CM | POA: Insufficient documentation

## 2016-09-28 DIAGNOSIS — Z8249 Family history of ischemic heart disease and other diseases of the circulatory system: Secondary | ICD-10-CM | POA: Insufficient documentation

## 2016-09-28 DIAGNOSIS — D071 Carcinoma in situ of vulva: Secondary | ICD-10-CM | POA: Diagnosis not present

## 2016-09-28 DIAGNOSIS — F200 Paranoid schizophrenia: Secondary | ICD-10-CM | POA: Diagnosis not present

## 2016-09-28 DIAGNOSIS — L298 Other pruritus: Secondary | ICD-10-CM | POA: Insufficient documentation

## 2016-09-28 DIAGNOSIS — F17210 Nicotine dependence, cigarettes, uncomplicated: Secondary | ICD-10-CM | POA: Insufficient documentation

## 2016-09-28 DIAGNOSIS — Z8544 Personal history of malignant neoplasm of other female genital organs: Secondary | ICD-10-CM

## 2016-09-28 DIAGNOSIS — N762 Acute vulvitis: Secondary | ICD-10-CM

## 2016-09-28 MED ORDER — OXYCODONE-ACETAMINOPHEN 10-325 MG PO TABS: 1.0000 | ORAL_TABLET | ORAL | 0 refills | Status: DC | PRN

## 2016-09-28 NOTE — Patient Instructions (Signed)
We will call you with the results.  Plan to follow up with Dr. Denman George in three months unless intervention is necessary.

## 2016-09-28 NOTE — Progress Notes (Signed)
Follow-up Note Note: Gyn-Onc  Consult was initially requested by Dr. Elonda Husky for the evaluation of Briana Wiley 46 y.o. 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   Chief Complaint  Patient presents with  . Vulvar Cancer  recurrent vulvar cancer   Assessment/Plan:  [name]  is a 46 y.o.  year old who has chronic paranoid schizophrenia, tobacco abuse and recurrent vulvar SCC s/p primary radiation (primary therapy completed February 13, 2014).  s/p right radical vulvectomy.   New area on left consistent with recurrent dysplasia versus malignancy - follow-up today's biopsy. If recurrence or dysplasia, recommend excision of left vulva.  Plan on follow-up in 3 months for cancer surveillance if biopsy normal.  HPI: Briana Wiley is a 46 year old woman who was seen in consultation at the request of Dr. Elonda Husky for clinical stage II vulvar squamous cell carcinoma. 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 She also has a history significant for paranoid schizophrenia and resides in her care facility for this. She is an extremely heavy smoker and continues to smoke 1-2 packs per day. She began experiencing vulvar irritation in the past few months. She was seen by Dr. Elonda Husky for this. He recognized the posterior 5 cm raised erythematous vulva mass and performed a biopsy. Pathology revealed at least high-grade VIN associated with an area highly suspicious for squamous cell carcinoma.  PET scan was performed which revealed positive inguinal and pelvic nodes.   She was treated with primary radiation with external beam radiation (45 gray to the pelvis and 55 gray with simulated integrated boost to the pelvic nodes). The patient then received a boost to the vulva area to a cumulative dose of 59.4 gray. Treatment dates were 12/26/13-02/13/14. She tolerated therapy well with no issues or delays.  She developed intermittent mild  vulvar pain. Biopsy of vulva on 06/24/15 showed VIN III and she underwent CO2 laser with Dr. Denman George in April 2017. That was last time that she was seen by our service. She was seen by radiation oncology in December was noted to have a 1.5 cm ulcerative lesion on the labia and was subsequently sent in to see [place] today. She states that she believes that this lesion is a portal and a while was hurting and having some bleeding earlier is no longer hurting and she is no longer having any bleeding.  Biopsy on 04/27/16 with Dr Alycia Rossetti showed at least [place].   She failed to show for appointments after this biopsy. She reported increasing vulvar pain.  On 06/23/16 she was able to be scheduled for a radical right vulvectomy. Pathology confirmed invasive SCC associated with VIN III. VIN III extended to the medial and inferior margin, however the margins were negative for SCC. Postoperatively she failed to appear for her scheduled post-op visit, however, when she did return, she demonstrated wound separation and cellulitis. It was treated with a course of keflex x 2 weeks.  Interval Hx: She returns today for scheduled follow-up.  She reports pruritis on left vulva. She reports pain in vulva and requests refill.  Current Meds:  Outpatient Encounter Prescriptions as of 09/28/2016  Medication Sig  . albuterol (PROVENTIL HFA;VENTOLIN HFA) 108 (90 BASE) MCG/ACT inhaler Inhale 2 puffs into the lungs every 4 (four) hours as needed for wheezing or shortness of breath.  . benzonatate (TESSALON) 100 MG capsule Take 1 capsule (100 mg total) by mouth 3 (three) times  daily as needed for cough.  . benztropine (COGENTIN) 1 MG tablet Take 1 mg by mouth 2 (two) times daily.  . haloperidol (HALDOL) 5 MG tablet Take 5 mg by mouth at bedtime.  . haloperidol decanoate (HALDOL DECANOATE) 100 MG/ML injection Inject into the muscle every 28 (twenty-eight) days.  Marland Kitchen HYDROcodone-acetaminophen (NORCO/VICODIN) 5-325 MG tablet Take 1 tablet by  mouth every 6 (six) hours as needed for moderate pain.  Marland Kitchen ibuprofen (ADVIL,MOTRIN) 400 MG tablet Take 400 mg by mouth every 6 (six) hours as needed.  . loperamide (IMODIUM A-D) 2 MG tablet Take 2 mg by mouth 4 (four) times daily as needed for diarrhea or loose stools. Reported on 09/24/2015  . naproxen (NAPROSYN) 250 MG tablet Take 1 tablet (250 mg total) by mouth 2 (two) times daily as needed for mild pain or moderate pain (take with food).  Marland Kitchen oxyCODONE-acetaminophen (PERCOCET) 10-325 MG tablet Take 1 tablet by mouth every 4 (four) hours as needed for pain.  . silver sulfADIAZINE (SILVADENE) 1 % cream Apply 1 application topically 2 (two) times daily. Reported on 09/24/2015  . solifenacin (VESICARE) 10 MG tablet Take by mouth daily.  . traZODone (DESYREL) 50 MG tablet Take 50 mg by mouth at bedtime. Takes 3 tabs at bedtime   No facility-administered encounter medications on file as of 09/28/2016.     Allergy:  Allergies  Allergen Reactions  . Chicken Meat (Diagnostic) Nausea Only    Social Hx:   Social History   Social History  . Marital status: Single    Spouse name: N/A  . Number of children: 3  . Years of education: N/A   Occupational History  .  Unemployed   Social History Main Topics  . Smoking status: Current Every Day Smoker    Packs/day: 0.50    Years: 30.00    Types: Cigars, Cigarettes  . Smokeless tobacco: Former Systems developer    Types: Snuff, Chew  . Alcohol use No  . Drug use: No  . Sexual activity: No   Other Topics Concern  . Not on file   Social History Narrative  . No narrative on file    Past Surgical Hx:  Past Surgical History:  Procedure Laterality Date  . CO2 LASER APPLICATION N/A 07/29/5033   Procedure: CO2 LASER OF THE VULVA;  Surgeon: [name], MD;  [facility];  Service: Gynecology;  Laterality: N/A;  . DILATION AND CURETTAGE OF UTERUS  2009   w hysteroscopy/ ablation  . RADICAL VULVECTOMY Right 06/23/2016   Procedure: RADICAL  VULVECTOMY;  Surgeon: [name], MD;  Location: [facility];  Service: Gynecology;  Laterality: Right;  . TUBAL LIGATION Bilateral   . VULVA / PERINEUM BIOPSY  12/02/13  . VULVECTOMY PARTIAL  2003    Past Medical Hx:  Past Medical History:  Diagnosis Date  . Chronic schizophrenia ([place])   . Depression   . Dyspnea    with exertion   . Headache   . Hypertension    patient reports but not on meds   . Radiation 12/26/13-02/13/14   45 gray to pelvis, pelvic nodes boosted to 55 gray, vulvar area boosted to 59.4 gray  . Trouble in sleeping   . Vulvar mass 11/07/2013  . Wears partial dentures     Past Gynecological History:  G3P3, SVD x3  No LMP recorded. Patient has had an ablation.  Family Hx:  Family History  Problem Relation Age of Onset  . Seizures Mother   .  Hypertension Sister    Vitals:  Blood pressure 113/80, pulse 60, temperature 97.9 F (36.6 C), resp. rate 20, weight 144 lb 8 oz (65.5 kg).  Physical Exam: WD in NAD  Groins: Negative for lymphadenopathy.  Genito Urinary  Vulva/vagina: incision on right vulva healed.  New irregular skin - leukoplakia, roughened, on left anterior labia minora,   PROCEDURE NOTE:  Verbal consent obtained. Time out performed. Vulva swabbed with betadine. Left labia minora infiltrated with 1% lidocaine 1cc. 23mm punch taken. Hemostasis obtained with silver nitrate. EBL: minimal Specimen: left vulva biopsy for surgical pathology Dispo: discharge to home.   Donaciano Eva, MD   09/28/2016, 2:42 PM

## 2016-10-04 ENCOUNTER — Telehealth: Payer: Self-pay

## 2016-10-04 NOTE — Telephone Encounter (Signed)
Left message for Ms Ciavarella's guardian Levie Heritage to call Dr. Serita Grit office to discuss biopsy results and surgical planning.

## 2016-10-04 NOTE — Telephone Encounter (Addendum)
-----   Message from [name], MD sent at 10/03/2016  4:03 PM EDT -----Pt notifeid of results on 10-04-16 and requested this office to contact her guardian Levie Heritage to schedule surgery. Can we please let Ms Servantes know that her biopsy showed precancer. Can we please book her for a wide local excision of the vulva in the [place] (outpatient procedure). I had already discussed this plan with Ms Diprima if her biopsy showed this.  Donaciano Eva, MD

## 2016-10-06 NOTE — Telephone Encounter (Signed)
Left message for Ms Cordts's guardian Levie Heritage to call Dr. Serita Grit office to discuss biopsy results and surgical planning.

## 2016-10-07 NOTE — Telephone Encounter (Signed)
Spoke with Ms Briana Wiley and patient will be scheduled for surgery on 10-25-16. Melissa Cross to post case. Told Ms Briana Wiley that she will be called by  Out patine t Surgery to be informed of time to bring [name] to the hospital for surgery on 10-25-16. Ms Briana Wiley verbalized understanding.

## 2016-10-12 DIAGNOSIS — D4959 Neoplasm of unspecified behavior of other genitourinary organ: Secondary | ICD-10-CM | POA: Diagnosis not present

## 2016-10-12 DIAGNOSIS — F17210 Nicotine dependence, cigarettes, uncomplicated: Secondary | ICD-10-CM | POA: Diagnosis not present

## 2016-10-12 DIAGNOSIS — J410 Simple chronic bronchitis: Secondary | ICD-10-CM | POA: Diagnosis not present

## 2016-10-12 DIAGNOSIS — F209 Schizophrenia, unspecified: Secondary | ICD-10-CM | POA: Diagnosis not present

## 2016-10-12 DIAGNOSIS — F17209 Nicotine dependence, unspecified, with unspecified nicotine-induced disorders: Secondary | ICD-10-CM | POA: Diagnosis not present

## 2016-10-17 ENCOUNTER — Encounter (HOSPITAL_BASED_OUTPATIENT_CLINIC_OR_DEPARTMENT_OTHER): Payer: Self-pay | Admitting: *Deleted

## 2016-10-18 ENCOUNTER — Encounter (HOSPITAL_BASED_OUTPATIENT_CLINIC_OR_DEPARTMENT_OTHER): Payer: Self-pay | Admitting: *Deleted

## 2016-10-18 NOTE — Progress Notes (Addendum)
SPOKE W/ PT'S CAREGIVER, [facility], AT [facility]. ([place]).  NPO AFTER MN.  ARRIVE AT 0945.  NEEDS HG.  WILL TAKE AM MEDS W/ SIPS OF WATER.  PT INDEPENDENT W/ ADL'S.  WILL DISCHARGE TO [facility] AND THEY ARE PROVIDING TRANSPORTATION.

## 2016-10-20 ENCOUNTER — Telehealth: Payer: Self-pay | Admitting: *Deleted

## 2016-10-20 NOTE — Telephone Encounter (Signed)
Patient's caregiver called regarding the time to bring the patient on July 3rd for surgery. Informed the patient that per-admitting should call her tomorrow,but the that surgery is for 11am, so couple hours before that.

## 2016-10-25 ENCOUNTER — Telehealth: Payer: Self-pay | Admitting: *Deleted

## 2016-10-25 ENCOUNTER — Ambulatory Visit (HOSPITAL_BASED_OUTPATIENT_CLINIC_OR_DEPARTMENT_OTHER): Admission: RE | Admit: 2016-10-25 | Payer: Medicare Other | Source: Ambulatory Visit | Admitting: Gynecologic Oncology

## 2016-10-25 HISTORY — DX: Personal history of irradiation: Z92.3

## 2016-10-25 HISTORY — DX: Personal history of malignant neoplasm of other female genital organs: Z85.44

## 2016-10-25 HISTORY — DX: Paranoid schizophrenia: F20.0

## 2016-10-25 HISTORY — DX: Carcinoma in situ of vulva: D07.1

## 2016-10-25 SURGERY — WIDE EXCISION VULVECTOMY
Anesthesia: Choice

## 2016-10-25 NOTE — Telephone Encounter (Signed)
Patient's caregiver Levie Heritage called and stated that "Bernece Gall is refusing surgery for today. She said she is tired of being cut on." Contacted the [facility] and was told "We tried to talk to her last night and this morning, and she flat refuses to come for surgery. Can you please call and talk to her yourself." Attempted to contact that patient, a caregiver answered the phone and stated that "she said she didn't want to talk to [place]." Melissa APP notified.

## 2016-11-23 DIAGNOSIS — F209 Schizophrenia, unspecified: Secondary | ICD-10-CM | POA: Diagnosis not present

## 2016-12-13 ENCOUNTER — Other Ambulatory Visit (HOSPITAL_COMMUNITY): Payer: Self-pay | Admitting: Internal Medicine

## 2016-12-13 DIAGNOSIS — Z1231 Encounter for screening mammogram for malignant neoplasm of breast: Secondary | ICD-10-CM

## 2016-12-16 ENCOUNTER — Ambulatory Visit (HOSPITAL_COMMUNITY): Payer: Medicare Other

## 2016-12-28 ENCOUNTER — Ambulatory Visit: Payer: Medicare Other | Admitting: Gynecologic Oncology

## 2016-12-29 ENCOUNTER — Ambulatory Visit (HOSPITAL_COMMUNITY)
Admission: RE | Admit: 2016-12-29 | Discharge: 2016-12-29 | Disposition: A | Discharge status: Home / Self Care | Payer: Medicare Other | Source: Ambulatory Visit | Attending: Internal Medicine | Admitting: Internal Medicine

## 2016-12-29 DIAGNOSIS — Z1231 Encounter for screening mammogram for malignant neoplasm of breast: Secondary | ICD-10-CM | POA: Diagnosis not present

## 2017-01-13 DIAGNOSIS — F17200 Nicotine dependence, unspecified, uncomplicated: Secondary | ICD-10-CM | POA: Diagnosis not present

## 2017-01-13 DIAGNOSIS — F209 Schizophrenia, unspecified: Secondary | ICD-10-CM | POA: Diagnosis not present

## 2017-01-13 DIAGNOSIS — D4959 Neoplasm of unspecified behavior of other genitourinary organ: Secondary | ICD-10-CM | POA: Diagnosis not present

## 2017-01-13 DIAGNOSIS — Z23 Encounter for immunization: Secondary | ICD-10-CM | POA: Diagnosis not present

## 2017-01-13 DIAGNOSIS — R32 Unspecified urinary incontinence: Secondary | ICD-10-CM | POA: Diagnosis not present

## 2017-03-08 DIAGNOSIS — F209 Schizophrenia, unspecified: Secondary | ICD-10-CM | POA: Diagnosis not present

## 2017-04-14 DIAGNOSIS — F209 Schizophrenia, unspecified: Secondary | ICD-10-CM | POA: Diagnosis not present

## 2017-04-14 DIAGNOSIS — Z0000 Encounter for general adult medical examination without abnormal findings: Secondary | ICD-10-CM | POA: Diagnosis not present

## 2017-04-14 DIAGNOSIS — F17210 Nicotine dependence, cigarettes, uncomplicated: Secondary | ICD-10-CM | POA: Diagnosis not present

## 2017-04-14 DIAGNOSIS — R32 Unspecified urinary incontinence: Secondary | ICD-10-CM | POA: Diagnosis not present

## 2017-04-14 DIAGNOSIS — Z1389 Encounter for screening for other disorder: Secondary | ICD-10-CM | POA: Diagnosis not present

## 2017-04-14 DIAGNOSIS — J309 Allergic rhinitis, unspecified: Secondary | ICD-10-CM | POA: Diagnosis not present

## 2017-04-14 DIAGNOSIS — Z859 Personal history of malignant neoplasm, unspecified: Secondary | ICD-10-CM | POA: Diagnosis not present

## 2017-04-14 DIAGNOSIS — F17200 Nicotine dependence, unspecified, uncomplicated: Secondary | ICD-10-CM | POA: Diagnosis not present

## 2017-04-27 ENCOUNTER — Telehealth: Payer: Self-pay | Admitting: *Deleted

## 2017-04-27 NOTE — Telephone Encounter (Signed)
Called and scheduled a follow up appt for the patient. Per the caregiver and patient, "she will come for you all to check her out, but no needles or surgery."

## 2017-06-01 ENCOUNTER — Telehealth: Payer: Self-pay | Admitting: *Deleted

## 2017-06-01 ENCOUNTER — Ambulatory Visit: Payer: Medicare Other | Admitting: Gynecologic Oncology

## 2017-06-02 NOTE — Telephone Encounter (Signed)
Already charted.

## 2017-06-05 ENCOUNTER — Encounter: Payer: Self-pay | Admitting: Gynecologic Oncology

## 2017-06-05 ENCOUNTER — Inpatient Hospital Stay: Payer: Medicare Other | Attending: Gynecologic Oncology | Admitting: Gynecologic Oncology

## 2017-06-05 VITALS — BP 125/80 | HR 74 | Temp 98.4°F | Resp 18

## 2017-06-05 DIAGNOSIS — F17210 Nicotine dependence, cigarettes, uncomplicated: Secondary | ICD-10-CM | POA: Insufficient documentation

## 2017-06-05 DIAGNOSIS — C519 Malignant neoplasm of vulva, unspecified: Secondary | ICD-10-CM | POA: Diagnosis not present

## 2017-06-05 MED ORDER — FLUOROURACIL 5 % EX CREA: TOPICAL_CREAM | Freq: Two times a day (BID) | CUTANEOUS | 0 refills | Status: DC

## 2017-06-05 NOTE — Patient Instructions (Addendum)
Plan on using the efudex cream to the vulva per the instructions given to you.  Follow up in three months or sooner if needed.  Please call for any questions, concerns, or new symptoms.   1 Use 5% concentration cream  2 Apply zinc oxide diaper rash ointment to the labial area  3 Fill applicator (available for purchase online) to 1g mark and apply to the skin on the left vagina  4 Use once a week at bedtime for 8 weeks and wash hands well afterward. You should also wear a pad.  5 On rising in the morning, take a shower or bath right away. Clean the area well and apply more zinc oxide, continuing for 2-3 days.  6 Avoid sexual intercourse for at least 3 days after applying 5-fluorouracil.  7 Follow-up with physician for a pap test 4 weeks after completion of treatment

## 2017-06-05 NOTE — Progress Notes (Signed)
Follow-up Note Note: Gyn-Onc  Consult was initially requested by Dr. Elonda Husky for the evaluation of [name] 47 y.o. 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   Chief Complaint  Patient presents with  . Vulvar Cancer  recurrent vulvar cancer   Assessment/Plan:  [name]  is a 47 y.o.  year old who has chronic paranoid schizophrenia, tobacco abuse and recurrent vulvar SCC s/p primary radiation (primary therapy completed February 13, 2014).  s/p right radical vulvectomy.   Recurrence on left vulva - patient declined intervention.  Will try 1gm of 5% efudex x 2 months (every other week).  HPI: Briana Wiley is a 47 year old woman who was seen in consultation at the request of Dr. Elonda Husky for clinical stage II vulvar squamous cell carcinoma. 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 She also has a history significant for paranoid schizophrenia and resides in her care facility for this. She is an extremely heavy smoker and continues to smoke 1-2 packs per day. She began experiencing vulvar irritation in the past few months. She was seen by Dr. Elonda Husky for this. He recognized the posterior 5 cm raised erythematous vulva mass and performed a biopsy. Pathology revealed at least high-grade VIN associated with an area highly suspicious for squamous cell carcinoma.  PET scan was performed which revealed positive inguinal and pelvic nodes.   She was treated with primary radiation with external beam radiation (45 gray to the pelvis and 55 gray with simulated integrated boost to the pelvic nodes). The patient then received a boost to the vulva area to a cumulative dose of 59.4 gray. Treatment dates were 12/26/13-02/13/14. She tolerated therapy well with no issues or delays.  She developed intermittent mild vulvar pain. Biopsy of vulva on 06/24/15 showed VIN III and she underwent CO2 laser with Dr. Denman George in April 2017. That was  last time that she was seen by our service. She was seen by radiation oncology in December was noted to have a 1.5 cm ulcerative lesion on the labia and was subsequently sent in to see [place] today. She states that she believes that this lesion is a portal and a while was hurting and having some bleeding earlier is no longer hurting and she is no longer having any bleeding.  Biopsy on 04/27/16 with Dr Alycia Rossetti showed at least [place].   She failed to show for appointments after this biopsy. She reported increasing vulvar pain.  On 06/23/16 she was able to be scheduled for a radical right vulvectomy. Pathology confirmed invasive SCC associated with VIN III. VIN III extended to the medial and inferior margin, however the margins were negative for SCC. Postoperatively she failed to appear for her scheduled post-op visit, however, when she did return, she demonstrated wound separation and cellulitis. It was treated with a course of keflex x 2 weeks.  Interval Hx: She returns today for scheduled follow-up.  She reported pruritis on left vulva in June, 2018. This was biopsied and showed VIN3. She was recommended surgery but declined because she was tired of being cut on. She has persistent vulvar pruritis.   Current Meds:  Outpatient Encounter Medications as of 06/05/2017  Medication Sig  . albuterol (PROVENTIL HFA;VENTOLIN HFA) 108 (90 BASE) MCG/ACT inhaler Inhale 2 puffs into the lungs every 4 (four) hours as needed for wheezing or shortness of breath.  . benztropine (COGENTIN) 1 MG tablet Take 1 mg by mouth  2 (two) times daily.  . haloperidol (HALDOL) 5 MG tablet Take 5 mg by mouth at bedtime.  . haloperidol decanoate (HALDOL DECANOATE) 100 MG/ML injection Inject into the muscle every 28 (twenty-eight) days.  Marland Kitchen HYDROcodone-acetaminophen (NORCO/VICODIN) 5-325 MG tablet Take 1 tablet by mouth every 6 (six) hours as needed for moderate pain.  Marland Kitchen ibuprofen (ADVIL,MOTRIN) 400 MG tablet Take 400 mg by mouth every 6  (six) hours as needed.  Marland Kitchen oxyCODONE-acetaminophen (PERCOCET) 10-325 MG tablet Take 1 tablet by mouth every 4 (four) hours as needed for pain.  Marland Kitchen solifenacin (VESICARE) 10 MG tablet Take 10 mg by mouth every morning.   . traZODone (DESYREL) 50 MG tablet Take 50 mg by mouth at bedtime. Takes 3 tabs at bedtime   No facility-administered encounter medications on file as of 06/05/2017.     Allergy:  Allergies  Allergen Reactions  . Chicken Meat (Diagnostic) Nausea Only    Social Hx:   Social History   Socioeconomic History  . Marital status: Single    Spouse name: Not on file  . Number of children: 3  . Years of education: Not on file  . Highest education level: Not on file  Social Needs  . Financial resource strain: Not on file  . Food insecurity - worry: Not on file  . Food insecurity - inability: Not on file  . Transportation needs - medical: Not on file  . Transportation needs - non-medical: Not on file  Occupational History    Employer: UNEMPLOYED  Tobacco Use  . Smoking status: Current Every Day Smoker    Packs/day: 1.50    Years: 30.00    Pack years: 45.00    Types: Cigarettes  . Smokeless tobacco: Never Used  Substance and Sexual Activity  . Alcohol use: No  . Drug use: No  . Sexual activity: No  Other Topics Concern  . Not on file  Social History Narrative  . Not on file    Past Surgical Hx:  Past Surgical History:  Procedure Laterality Date  . CO2 LASER APPLICATION N/A 12/27/8544   Procedure: CO2 LASER OF THE VULVA;  Surgeon: [name], MD;  Location: [facility];  Service: Gynecology;  Laterality: N/A;  . COLD KNIFE CONIZATION OF CERVIX AND MULTPLE VULVA BX'S  05/14/2001  . D & C HYSTEROSCOPY W/ ENDOMETRIAL ABLATION   02/06/2008  . RADICAL VULVECTOMY Right 06/23/2016   Procedure: RADICAL VULVECTOMY;  Surgeon: [name], MD;  Location: [facility];  Service: Gynecology;  Laterality: Right;  . TUBAL LIGATION Bilateral yrs ago  . VULVA / PERINEUM  BIOPSY  12/02/13  . VULVECTOMY PARTIAL Bilateral 07/15/2002    Past Medical Hx:  Past Medical History:  Diagnosis Date  . Chronic paranoid schizophrenia ([place])   . Depression   . History of cancer of vulva    oncologist-  dr Denman George--- s/p  bilateral partial vulvectomy 07-15-2002;  completed external beam radiation 02-13-2014;  s/p  laser vulva dysplasia 07-28-2015;   s/p  radical vulvectomy 03/ 2018  . History of radiation therapy 12-26-2013  to 02-13-2014   45gray ro pelvis, pelvic nodes boosted to 55gray, vulvar area boosted to 59.4gray  . VIN III (vulvar intraepithelial neoplasia III)    recurrent  . Wears partial dentures     Past Gynecological History:  G3P3, SVD x3  No LMP recorded. Patient has had an ablation.  Family Hx:  Family History  Problem Relation Age of Onset  . Seizures Mother   .  Hypertension Sister    Vitals:  There were no vitals taken for this visit.  Physical Exam: WD in NAD  Groins: Negative for lymphadenopathy.  Genito Urinary  Vulva/vagina: incision on right vulva healed.  New irregular skin - persistent leukoplakia, roughened, on left anterior labia minora which had been biopsied on June, 2018 and showed VIN 3.    Donaciano Eva, MD   06/05/2017, 3:56 PM

## 2017-07-19 DIAGNOSIS — F209 Schizophrenia, unspecified: Secondary | ICD-10-CM | POA: Diagnosis not present

## 2017-07-21 DIAGNOSIS — F17200 Nicotine dependence, unspecified, uncomplicated: Secondary | ICD-10-CM | POA: Diagnosis not present

## 2017-07-21 DIAGNOSIS — F209 Schizophrenia, unspecified: Secondary | ICD-10-CM | POA: Diagnosis not present

## 2017-07-21 DIAGNOSIS — D4959 Neoplasm of unspecified behavior of other genitourinary organ: Secondary | ICD-10-CM | POA: Diagnosis not present

## 2017-07-21 DIAGNOSIS — Z859 Personal history of malignant neoplasm, unspecified: Secondary | ICD-10-CM | POA: Diagnosis not present

## 2017-08-29 ENCOUNTER — Telehealth: Payer: Self-pay | Admitting: *Deleted

## 2017-08-29 NOTE — Telephone Encounter (Signed)
Called and spoke with [place]. Moved the appt from May 15th to May 10th.

## 2017-09-01 ENCOUNTER — Inpatient Hospital Stay: Payer: Medicare Other | Attending: Gynecologic Oncology | Admitting: Gynecologic Oncology

## 2017-09-01 ENCOUNTER — Encounter: Payer: Self-pay | Admitting: Gynecologic Oncology

## 2017-09-01 VITALS — BP 126/86 | HR 65 | Temp 98.6°F | Resp 20 | Ht 66.0 in | Wt 140.7 lb

## 2017-09-01 DIAGNOSIS — C519 Malignant neoplasm of vulva, unspecified: Secondary | ICD-10-CM | POA: Diagnosis present

## 2017-09-01 DIAGNOSIS — Z923 Personal history of irradiation: Secondary | ICD-10-CM | POA: Insufficient documentation

## 2017-09-01 NOTE — Patient Instructions (Addendum)
Please stop using the cream (effudex) on the vulva as of today. It will impair healing at the time of surgery.  Dr Denman George is scheduling you for a procedure in which the irritated skin on the left vulva area is surgically removed. Dr Serita Grit office will provide you with more specific information about this surgery.  It will be an outpatient procedure.   We will contact you with the date.

## 2017-09-01 NOTE — Progress Notes (Signed)
Follow-up Note Note: Gyn-Onc  Consult was initially requested by Dr. Elonda Husky for the evaluation of CAMBELLE SUCHECKI 47 y.o. 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   Chief Complaint  Patient presents with  . Vulvar cancer [facility])  recurrent vulvar cancer   Assessment/Plan:  Ms. SHAWNDRA CLUTE  is a 47 y.o.  year old who has chronic paranoid schizophrenia, tobacco abuse and recurrent vulvar SCC s/p primary radiation (primary therapy completed February 13, 2014).  s/p right radical vulvectomy.   Recurrence on left vulva - no response to efudex. Recommend excision again. This time Akshita was willing to undergo this. We will schedule for a wide local excision of the vulva on 09/14/17.  I discussed the surgery, its risks, postoperative healing and wound care. There is a high risk for wound separation given her prior radiation and tobacco use.   HPI: Genny Caulder is a 47 year old woman who was seen in consultation at the request of Dr. Elonda Husky for clinical stage II vulvar squamous cell carcinoma. 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 She also has a history significant for paranoid schizophrenia and resides in her care facility for this. She is an extremely heavy smoker and continues to smoke 1-2 packs per day. She began experiencing vulvar irritation in the past few months. She was seen by Dr. Elonda Husky for this. He recognized the posterior 5 cm raised erythematous vulva mass and performed a biopsy. Pathology revealed at least high-grade VIN associated with an area highly suspicious for squamous cell carcinoma.  PET scan was performed which revealed positive inguinal and pelvic nodes.   She was treated with primary radiation with external beam radiation (45 gray to the pelvis and 55 gray with simulated integrated boost to the pelvic nodes). The patient then received a boost to the vulva area to a cumulative dose of 59.4 gray.  Treatment dates were 12/26/13-02/13/14. She tolerated therapy well with no issues or delays.  She developed intermittent mild vulvar pain. Biopsy of vulva on 06/24/15 showed VIN III and she underwent CO2 laser with Dr. Denman George in April 2017. That was last time that she was seen by our service. She was seen by radiation oncology in December was noted to have a 1.5 cm ulcerative lesion on the labia and was subsequently sent in to see [place] today. She states that she believes that this lesion is a portal and a while was hurting and having some bleeding earlier is no longer hurting and she is no longer having any bleeding.  Biopsy on 04/27/16 with Dr Alycia Rossetti showed at least [place].   She failed to show for appointments after this biopsy. She reported increasing vulvar pain.  On 06/23/16 she was able to be scheduled for a radical right vulvectomy. Pathology confirmed invasive SCC associated with VIN III. VIN III extended to the medial and inferior margin, however the margins were negative for SCC. Postoperatively she failed to appear for her scheduled post-op visit, however, when she did return, she demonstrated wound separation and cellulitis. It was treated with a course of keflex x 2 weeks.  Interval Hx: She returns today for scheduled follow-up.  She reported pruritis on left vulva in June, 2018. This was biopsied and showed VIN3. She was recommended surgery but declined because she was tired of being cut on.  She was treated with effudex instead which she reports she has been using as directed between February, 2019  until May, 2019. She has persistent vulvar pruritis.   Current Meds:  Outpatient Encounter Medications as of 09/01/2017  Medication Sig  . albuterol (PROVENTIL HFA;VENTOLIN HFA) 108 (90 BASE) MCG/ACT inhaler Inhale 2 puffs into the lungs every 4 (four) hours as needed for wheezing or shortness of breath.  . benztropine (COGENTIN) 1 MG tablet Take 1 mg by mouth 2 (two) times daily.  .  fluorouracil (EFUDEX) 5 % cream Apply topically 2 (two) times daily. Use 5% concentration cream Apply zinc oxide diaper rash ointment to the normal skin on the right Fill applicator (available for purchase online) to 1g mark and apply to the skin on the left vagina Use once a week at bedtime for 8 weeks and wash hands well afterward. You should also wear a pad. On rising in the morning, take a shower or bath right away. Clean the area well and apply more zinc oxide  . haloperidol (HALDOL) 5 MG tablet Take 5 mg by mouth at bedtime.  . haloperidol decanoate (HALDOL DECANOATE) 100 MG/ML injection Inject into the muscle every 21 ( twenty-one) days.   Marland Kitchen ibuprofen (ADVIL,MOTRIN) 400 MG tablet Take 400 mg by mouth every 6 (six) hours as needed.  . solifenacin (VESICARE) 10 MG tablet Take 10 mg by mouth every morning.   . traZODone (DESYREL) 50 MG tablet Take 50 mg by mouth at bedtime. Takes 3 tabs at bedtime   No facility-administered encounter medications on file as of 09/01/2017.     Allergy:  Allergies  Allergen Reactions  . Chicken Meat (Diagnostic) Nausea Only    Social Hx:   Social History   Socioeconomic History  . Marital status: Single    Spouse name: Not on file  . Number of children: 3  . Years of education: Not on file  . Highest education level: Not on file  Occupational History    Employer: UNEMPLOYED  Social Needs  . Financial resource strain: Not on file  . Food insecurity:    Worry: Not on file    Inability: Not on file  . Transportation needs:    Medical: Not on file    Non-medical: Not on file  Tobacco Use  . Smoking status: Current Every Day Smoker    Packs/day: 1.50    Years: 30.00    Pack years: 45.00    Types: Cigarettes  . Smokeless tobacco: Never Used  Substance and Sexual Activity  . Alcohol use: No  . Drug use: No  . Sexual activity: Never  Lifestyle  . Physical activity:    Days per week: Not on file    Minutes per session: Not on file  .  Stress: Not on file  Relationships  . Social connections:    Talks on phone: Not on file    Gets together: Not on file    Attends religious service: Not on file    Active member of club or organization: Not on file    Attends meetings of clubs or organizations: Not on file    Relationship status: Not on file  . Intimate partner violence:    Fear of current or ex partner: Not on file    Emotionally abused: Not on file    Physically abused: Not on file    Forced sexual activity: Not on file  Other Topics Concern  . Not on file  Social History Narrative  . Not on file    Past Surgical Hx:  Past Surgical History:  Procedure Laterality Date  .  CO2 LASER APPLICATION N/A 07/02/7671   Procedure: CO2 LASER OF THE VULVA;  Surgeon: [name], MD;  Location: [facility];  Service: Gynecology;  Laterality: N/A;  . COLD KNIFE CONIZATION OF CERVIX AND MULTPLE VULVA BX'S  05/14/2001  . D & C HYSTEROSCOPY W/ ENDOMETRIAL ABLATION   02/06/2008  . RADICAL VULVECTOMY Right 06/23/2016   Procedure: RADICAL VULVECTOMY;  Surgeon: [name], MD;  Location: [facility];  Service: Gynecology;  Laterality: Right;  . TUBAL LIGATION Bilateral yrs ago  . VULVA / PERINEUM BIOPSY  12/02/13  . VULVECTOMY PARTIAL Bilateral 07/15/2002    Past Medical Hx:  Past Medical History:  Diagnosis Date  . Chronic paranoid schizophrenia ([place])   . Depression   . History of cancer of vulva    oncologist-  dr Denman George--- s/p  bilateral partial vulvectomy 07-15-2002;  completed external beam radiation 02-13-2014;  s/p  laser vulva dysplasia 07-28-2015;   s/p  radical vulvectomy 03/ 2018  . History of radiation therapy 12-26-2013  to 02-13-2014   45gray ro pelvis, pelvic nodes boosted to 55gray, vulvar area boosted to 59.4gray  . VIN III (vulvar intraepithelial neoplasia III)    recurrent  . Wears partial dentures     Past Gynecological History:  G3P3, SVD x3  No LMP recorded. Patient has had an ablation.  Family  Hx:  Family History  Problem Relation Age of Onset  . Seizures Mother   . Hypertension Sister    Vitals:  Blood pressure 126/86, pulse 65, temperature 98.6 F (37 C), temperature source Oral, resp. rate 20, height 5\' 6"  (1.676 m), weight 140 lb 11.2 oz (63.8 kg), SpO2 100 %.  Physical Exam: WD in NAD  Groins: Negative for lymphadenopathy.  Genito Urinary  Vulva/vagina: incision on right vulva healed.  Persistent 3cm area of irregular skin - persistent leukoplakia, roughened, on left anterior labia minora which had been biopsied on June, 2018 and showed VIN 3.    Thereasa Solo, MD   09/01/2017, 4:55 PM

## 2017-09-01 NOTE — H&P (View-Only) (Signed)
Follow-up Note Note: Gyn-Onc  Consult was initially requested by Dr. Elonda Husky for the evaluation of Briana Wiley 47 y.o. 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   Chief Complaint  Patient presents with  . Vulvar cancer [facility])  recurrent vulvar cancer   Assessment/Plan:  Briana Wiley  is a 47 y.o.  year old who has chronic paranoid schizophrenia, tobacco abuse and recurrent vulvar SCC s/p primary radiation (primary therapy completed February 13, 2014).  s/p right radical vulvectomy.   Recurrence on left vulva - no response to efudex. Recommend excision again. This time Briana Wiley was willing to undergo this. We will schedule for a wide local excision of the vulva on 09/14/17.  I discussed the surgery, its risks, postoperative healing and wound care. There is a high risk for wound separation given her prior radiation and tobacco use.   HPI: Briana Wiley is a 47 year old woman who was seen in consultation at the request of Dr. Elonda Husky for clinical stage II vulvar squamous cell carcinoma. 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 She also has a history significant for paranoid schizophrenia and resides in her care facility for this. She is an extremely heavy smoker and continues to smoke 1-2 packs per day. She began experiencing vulvar irritation in the past few months. She was seen by Dr. Elonda Husky for this. He recognized the posterior 5 cm raised erythematous vulva mass and performed a biopsy. Pathology revealed at least high-grade VIN associated with an area highly suspicious for squamous cell carcinoma.  PET scan was performed which revealed positive inguinal and pelvic nodes.   She was treated with primary radiation with external beam radiation (45 gray to the pelvis and 55 gray with simulated integrated boost to the pelvic nodes). The patient then received a boost to the vulva area to a cumulative dose of 59.4 gray.  Treatment dates were 12/26/13-02/13/14. She tolerated therapy well with no issues or delays.  She developed intermittent mild vulvar pain. Biopsy of vulva on 06/24/15 showed VIN III and she underwent CO2 laser with Dr. Denman George in April 2017. That was last time that she was seen by our service. She was seen by radiation oncology in December was noted to have a 1.5 cm ulcerative lesion on the labia and was subsequently sent in to see [place] today. She states that she believes that this lesion is a portal and a while was hurting and having some bleeding earlier is no longer hurting and she is no longer having any bleeding.  Biopsy on 04/27/16 with Dr Alycia Rossetti showed at least [place].   She failed to show for appointments after this biopsy. She reported increasing vulvar pain.  On 06/23/16 she was able to be scheduled for a radical right vulvectomy. Pathology confirmed invasive SCC associated with VIN III. VIN III extended to the medial and inferior margin, however the margins were negative for SCC. Postoperatively she failed to appear for her scheduled post-op visit, however, when she did return, she demonstrated wound separation and cellulitis. It was treated with a course of keflex x 2 weeks.  Interval Hx: She returns today for scheduled follow-up.  She reported pruritis on left vulva in June, 2018. This was biopsied and showed VIN3. She was recommended surgery but declined because she was tired of being cut on.  She was treated with effudex instead which she reports she has been using as directed between February, 2019  until May, 2019. She has persistent vulvar pruritis.   Current Meds:  Outpatient Encounter Medications as of 09/01/2017  Medication Sig  . albuterol (PROVENTIL HFA;VENTOLIN HFA) 108 (90 BASE) MCG/ACT inhaler Inhale 2 puffs into the lungs every 4 (four) hours as needed for wheezing or shortness of breath.  . benztropine (COGENTIN) 1 MG tablet Take 1 mg by mouth 2 (two) times daily.  .  fluorouracil (EFUDEX) 5 % cream Apply topically 2 (two) times daily. Use 5% concentration cream Apply zinc oxide diaper rash ointment to the normal skin on the right Fill applicator (available for purchase online) to 1g mark and apply to the skin on the left vagina Use once a week at bedtime for 8 weeks and wash hands well afterward. You should also wear a pad. On rising in the morning, take a shower or bath right away. Clean the area well and apply more zinc oxide  . haloperidol (HALDOL) 5 MG tablet Take 5 mg by mouth at bedtime.  . haloperidol decanoate (HALDOL DECANOATE) 100 MG/ML injection Inject into the muscle every 21 ( twenty-one) days.   Marland Kitchen ibuprofen (ADVIL,MOTRIN) 400 MG tablet Take 400 mg by mouth every 6 (six) hours as needed.  . solifenacin (VESICARE) 10 MG tablet Take 10 mg by mouth every morning.   . traZODone (DESYREL) 50 MG tablet Take 50 mg by mouth at bedtime. Takes 3 tabs at bedtime   No facility-administered encounter medications on file as of 09/01/2017.     Allergy:  Allergies  Allergen Reactions  . Chicken Meat (Diagnostic) Nausea Only    Social Hx:   Social History   Socioeconomic History  . Marital status: Single    Spouse name: Not on file  . Number of children: 3  . Years of education: Not on file  . Highest education level: Not on file  Occupational History    Employer: UNEMPLOYED  Social Needs  . Financial resource strain: Not on file  . Food insecurity:    Worry: Not on file    Inability: Not on file  . Transportation needs:    Medical: Not on file    Non-medical: Not on file  Tobacco Use  . Smoking status: Current Every Day Smoker    Packs/day: 1.50    Years: 30.00    Pack years: 45.00    Types: Cigarettes  . Smokeless tobacco: Never Used  Substance and Sexual Activity  . Alcohol use: No  . Drug use: No  . Sexual activity: Never  Lifestyle  . Physical activity:    Days per week: Not on file    Minutes per session: Not on file  .  Stress: Not on file  Relationships  . Social connections:    Talks on phone: Not on file    Gets together: Not on file    Attends religious service: Not on file    Active member of club or organization: Not on file    Attends meetings of clubs or organizations: Not on file    Relationship status: Not on file  . Intimate partner violence:    Fear of current or ex partner: Not on file    Emotionally abused: Not on file    Physically abused: Not on file    Forced sexual activity: Not on file  Other Topics Concern  . Not on file  Social History Narrative  . Not on file    Past Surgical Hx:  Past Surgical History:  Procedure Laterality Date  .  CO2 LASER APPLICATION N/A 12/24/7913   Procedure: CO2 LASER OF THE VULVA;  Surgeon: [name], MD;  Location: [facility];  Service: Gynecology;  Laterality: N/A;  . COLD KNIFE CONIZATION OF CERVIX AND MULTPLE VULVA BX'S  05/14/2001  . D & C HYSTEROSCOPY W/ ENDOMETRIAL ABLATION   02/06/2008  . RADICAL VULVECTOMY Right 06/23/2016   Procedure: RADICAL VULVECTOMY;  Surgeon: [name], MD;  Location: [facility];  Service: Gynecology;  Laterality: Right;  . TUBAL LIGATION Bilateral yrs ago  . VULVA / PERINEUM BIOPSY  12/02/13  . VULVECTOMY PARTIAL Bilateral 07/15/2002    Past Medical Hx:  Past Medical History:  Diagnosis Date  . Chronic paranoid schizophrenia ([place])   . Depression   . History of cancer of vulva    oncologist-  dr Denman George--- s/p  bilateral partial vulvectomy 07-15-2002;  completed external beam radiation 02-13-2014;  s/p  laser vulva dysplasia 07-28-2015;   s/p  radical vulvectomy 03/ 2018  . History of radiation therapy 12-26-2013  to 02-13-2014   45gray ro pelvis, pelvic nodes boosted to 55gray, vulvar area boosted to 59.4gray  . VIN III (vulvar intraepithelial neoplasia III)    recurrent  . Wears partial dentures     Past Gynecological History:  G3P3, SVD x3  No LMP recorded. Patient has had an ablation.  Family  Hx:  Family History  Problem Relation Age of Onset  . Seizures Mother   . Hypertension Sister    Vitals:  Blood pressure 126/86, pulse 65, temperature 98.6 F (37 C), temperature source Oral, resp. rate 20, height 5\' 6"  (1.676 m), weight 140 lb 11.2 oz (63.8 kg), SpO2 100 %.  Physical Exam: WD in NAD  Groins: Negative for lymphadenopathy.  Genito Urinary  Vulva/vagina: incision on right vulva healed.  Persistent 3cm area of irregular skin - persistent leukoplakia, roughened, on left anterior labia minora which had been biopsied on June, 2018 and showed VIN 3.    Thereasa Solo, MD   09/01/2017, 4:55 PM

## 2017-09-05 ENCOUNTER — Telehealth: Payer: Self-pay

## 2017-09-05 NOTE — Telephone Encounter (Addendum)
Per Joylene John NP: please let her caregiver know that pt's surgery is scheduled for May 23rd at [facility] outpatient surgery center; she will receive a phone call from the presurgical nurse few days before. Outgoing call, notified [name], pt's guardian.  She said the patient has already refused this surgery for over a year and a half.  She remarked that she didn't come with her at the last appt here and needs to speak to the patient on whether she has agreed to surgery or not. [name] will call me back - our contact info given. Notified Melissa NP.

## 2017-09-05 NOTE — Telephone Encounter (Signed)
Outgoing call to pt's guardian [name] to see if she has had a chance to speak with the patient regarding surgery.  [name] verbalized she has spoke with pt and she does want the surgery.  I let her know per Lenna Sciara NP the surgery is scheduled for May 23 rd at 1:30 pm in the [facility] outpatient surgery center and she will receive a phone call from the pre-surgical nurse few days before.  She voiced understanding and said the best number to reach her is [phone]. No other needs per her at this time.

## 2017-09-06 ENCOUNTER — Ambulatory Visit: Payer: Medicare Other | Admitting: Gynecologic Oncology

## 2017-09-08 NOTE — Progress Notes (Addendum)
Spoke with group home director beverly rucker, patient signs own consents is her own guardian, medication  record and FL2 requested [facility]ly rucker home care fax [phone] and phone [phone], beverly rucker requested patient to be npo after midnight at group home. [name] rucker aware of surgery 09-14-17 arrive 59 am [place] [facility] for patient.

## 2017-09-13 NOTE — Progress Notes (Signed)
SPOKE W/ PT GROUP HOME DIRECTOR, BEVERLY RUCKER, STATED WILL HAVE HER STAFF MEMBER WITH WILL BRINGING PT DOS , TO BRING MEDICATION LIST.

## 2017-09-14 ENCOUNTER — Ambulatory Visit (HOSPITAL_BASED_OUTPATIENT_CLINIC_OR_DEPARTMENT_OTHER)
Admission: RE | Admit: 2017-09-14 | Discharge: 2017-09-14 | Disposition: A | Discharge status: Home / Self Care | Payer: Medicare Other | Source: Ambulatory Visit | Attending: Gynecologic Oncology | Admitting: Gynecologic Oncology

## 2017-09-14 ENCOUNTER — Encounter (HOSPITAL_BASED_OUTPATIENT_CLINIC_OR_DEPARTMENT_OTHER): Payer: Self-pay

## 2017-09-14 ENCOUNTER — Encounter (HOSPITAL_BASED_OUTPATIENT_CLINIC_OR_DEPARTMENT_OTHER)
Admission: RE | Disposition: A | Discharge status: Home / Self Care | Payer: Self-pay | Source: Ambulatory Visit | Attending: Gynecologic Oncology

## 2017-09-14 ENCOUNTER — Ambulatory Visit (HOSPITAL_BASED_OUTPATIENT_CLINIC_OR_DEPARTMENT_OTHER): Payer: Medicare Other | Admitting: Anesthesiology

## 2017-09-14 DIAGNOSIS — D071 Carcinoma in situ of vulva: Secondary | ICD-10-CM | POA: Diagnosis not present

## 2017-09-14 DIAGNOSIS — F329 Major depressive disorder, single episode, unspecified: Secondary | ICD-10-CM | POA: Diagnosis not present

## 2017-09-14 DIAGNOSIS — C510 Malignant neoplasm of labium majus: Secondary | ICD-10-CM | POA: Insufficient documentation

## 2017-09-14 DIAGNOSIS — Z91018 Allergy to other foods: Secondary | ICD-10-CM | POA: Diagnosis not present

## 2017-09-14 DIAGNOSIS — Z8249 Family history of ischemic heart disease and other diseases of the circulatory system: Secondary | ICD-10-CM | POA: Insufficient documentation

## 2017-09-14 DIAGNOSIS — Z923 Personal history of irradiation: Secondary | ICD-10-CM | POA: Insufficient documentation

## 2017-09-14 DIAGNOSIS — Z79899 Other long term (current) drug therapy: Secondary | ICD-10-CM | POA: Diagnosis not present

## 2017-09-14 DIAGNOSIS — F17210 Nicotine dependence, cigarettes, uncomplicated: Secondary | ICD-10-CM | POA: Insufficient documentation

## 2017-09-14 DIAGNOSIS — F209 Schizophrenia, unspecified: Secondary | ICD-10-CM | POA: Diagnosis not present

## 2017-09-14 DIAGNOSIS — I10 Essential (primary) hypertension: Secondary | ICD-10-CM | POA: Diagnosis not present

## 2017-09-14 DIAGNOSIS — F200 Paranoid schizophrenia: Secondary | ICD-10-CM | POA: Diagnosis not present

## 2017-09-14 DIAGNOSIS — N903 Dysplasia of vulva, unspecified: Secondary | ICD-10-CM | POA: Diagnosis not present

## 2017-09-14 DIAGNOSIS — C519 Malignant neoplasm of vulva, unspecified: Secondary | ICD-10-CM

## 2017-09-14 DIAGNOSIS — N390 Urinary tract infection, site not specified: Secondary | ICD-10-CM | POA: Diagnosis not present

## 2017-09-14 DIAGNOSIS — Z9889 Other specified postprocedural states: Secondary | ICD-10-CM | POA: Insufficient documentation

## 2017-09-14 HISTORY — PX: VULVECTOMY: SHX1086

## 2017-09-14 LAB — POCT PREGNANCY, URINE: Preg Test, Ur: NEGATIVE

## 2017-09-14 SURGERY — WIDE EXCISION VULVECTOMY
Anesthesia: General

## 2017-09-14 MED ORDER — CEFAZOLIN SODIUM-DEXTROSE 2-4 GM/100ML-% IV SOLN
INTRAVENOUS | Status: AC
  Filled 2017-09-14: qty 100

## 2017-09-14 MED ORDER — LIDOCAINE HCL (CARDIAC) PF 100 MG/5ML IV SOSY
PREFILLED_SYRINGE | INTRAVENOUS | Status: DC | PRN
  Administered 2017-09-14: 80 mg via INTRAVENOUS

## 2017-09-14 MED ORDER — OXYCODONE-ACETAMINOPHEN 5-325 MG PO TABS: 1.0000 | ORAL_TABLET | ORAL | 0 refills | Status: DC | PRN

## 2017-09-14 MED ORDER — FENTANYL CITRATE (PF) 100 MCG/2ML IJ SOLN
INTRAMUSCULAR | Status: DC | PRN
  Administered 2017-09-14 (×2): 25 ug via INTRAVENOUS

## 2017-09-14 MED ORDER — PROPOFOL 10 MG/ML IV BOLUS
INTRAVENOUS | Status: DC | PRN
  Administered 2017-09-14: 140 mg via INTRAVENOUS

## 2017-09-14 MED ORDER — CEFAZOLIN SODIUM-DEXTROSE 2-4 GM/100ML-% IV SOLN
2.0000 g | INTRAVENOUS | Status: AC
  Administered 2017-09-14: 2 g via INTRAVENOUS
  Filled 2017-09-14: qty 100

## 2017-09-14 MED ORDER — ONDANSETRON HCL 4 MG/2ML IJ SOLN
INTRAMUSCULAR | Status: AC
  Filled 2017-09-14: qty 2

## 2017-09-14 MED ORDER — ACETAMINOPHEN 10 MG/ML IV SOLN
1000.0000 mg | Freq: Four times a day (QID) | INTRAVENOUS | Status: DC
  Administered 2017-09-14: 1000 mg via INTRAVENOUS
  Filled 2017-09-14: qty 100

## 2017-09-14 MED ORDER — FENTANYL CITRATE (PF) 100 MCG/2ML IJ SOLN
INTRAMUSCULAR | Status: AC
  Filled 2017-09-14: qty 2

## 2017-09-14 MED ORDER — DEXAMETHASONE SODIUM PHOSPHATE 10 MG/ML IJ SOLN
INTRAMUSCULAR | Status: DC | PRN
  Administered 2017-09-14: 10 mg via INTRAVENOUS

## 2017-09-14 MED ORDER — LIDOCAINE HCL 1 % IJ SOLN
INTRAMUSCULAR | Status: DC | PRN
  Administered 2017-09-14: 10 mL

## 2017-09-14 MED ORDER — SENNA 8.6 MG PO TABS: 1.0000 | ORAL_TABLET | Freq: Every evening | ORAL | 0 refills | Status: DC | PRN

## 2017-09-14 MED ORDER — LACTATED RINGERS IV SOLN
INTRAVENOUS | Status: DC
Start: 2017-09-14 — End: 2017-09-14
  Administered 2017-09-14: 13:00:00 via INTRAVENOUS
  Filled 2017-09-14: qty 1000

## 2017-09-14 MED ORDER — KETOROLAC TROMETHAMINE 30 MG/ML IJ SOLN
INTRAMUSCULAR | Status: DC | PRN
  Administered 2017-09-14: 30 mg via INTRAVENOUS

## 2017-09-14 MED ORDER — ACETIC ACID 5 % SOLN
Status: DC | PRN
  Administered 2017-09-14: 1 via TOPICAL

## 2017-09-14 MED ORDER — DEXAMETHASONE SODIUM PHOSPHATE 10 MG/ML IJ SOLN
INTRAMUSCULAR | Status: AC
  Filled 2017-09-14: qty 1

## 2017-09-14 MED ORDER — ACETAMINOPHEN 10 MG/ML IV SOLN
INTRAVENOUS | Status: AC
  Filled 2017-09-14: qty 100

## 2017-09-14 MED ORDER — LIDOCAINE 2% (20 MG/ML) 5 ML SYRINGE
INTRAMUSCULAR | Status: AC
  Filled 2017-09-14: qty 5

## 2017-09-14 MED ORDER — ONDANSETRON HCL 4 MG/2ML IJ SOLN
INTRAMUSCULAR | Status: DC | PRN
  Administered 2017-09-14: 4 mg via INTRAVENOUS

## 2017-09-14 MED ORDER — MIDAZOLAM HCL 2 MG/2ML IJ SOLN
INTRAMUSCULAR | Status: DC | PRN
  Administered 2017-09-14: 2 mg via INTRAVENOUS

## 2017-09-14 MED ORDER — MIDAZOLAM HCL 2 MG/2ML IJ SOLN
INTRAMUSCULAR | Status: AC
  Filled 2017-09-14: qty 2

## 2017-09-14 MED ORDER — KETOROLAC TROMETHAMINE 30 MG/ML IJ SOLN
INTRAMUSCULAR | Status: AC
  Filled 2017-09-14: qty 1

## 2017-09-14 SURGICAL SUPPLY — 22 items
BLADE CLIPPER SURG (BLADE) IMPLANT
BLADE SURG 15 STRL LF DISP TIS (BLADE) ×1 IMPLANT
BLADE SURG 15 STRL SS (BLADE) ×2
CANISTER SUCT 3000ML PPV (MISCELLANEOUS) ×2 IMPLANT
CATH ROBINSON RED A/P 14FR (CATHETERS) ×2 IMPLANT
GLOVE BIO SURGEON STRL SZ 6 (GLOVE) ×4 IMPLANT
KIT TURNOVER CYSTO (KITS) ×2 IMPLANT
NDL HYPO 25X1 1.5 SAFETY (NEEDLE) ×1 IMPLANT
NEEDLE HYPO 25X1 1.5 SAFETY (NEEDLE) ×2 IMPLANT
NS IRRIG 500ML POUR BTL (IV SOLUTION) ×2 IMPLANT
PACK PERINEAL COLD (PAD) ×2 IMPLANT
PACK VAGINAL WOMENS (CUSTOM PROCEDURE TRAY) ×2 IMPLANT
SUT VIC AB 0 SH 27 (SUTURE) ×2 IMPLANT
SUT VIC AB 2-0 CT2 27 (SUTURE) IMPLANT
SUT VIC AB 2-0 SH 27 (SUTURE)
SUT VIC AB 2-0 SH 27XBRD (SUTURE) IMPLANT
SUT VIC AB 3-0 SH 27 (SUTURE) ×6
SUT VIC AB 3-0 SH 27X BRD (SUTURE) ×1 IMPLANT
SUT VICRYL 4-0 PS2 18IN ABS (SUTURE) ×4 IMPLANT
SYR BULB IRRIGATION 50ML (SYRINGE) ×2 IMPLANT
TOWEL OR 17X24 6PK STRL BLUE (TOWEL DISPOSABLE) ×4 IMPLANT
WATER STERILE IRR 500ML POUR (IV SOLUTION) IMPLANT

## 2017-09-14 NOTE — Discharge Instructions (Signed)
Vulvectomy, Care After    Dr Denman George removed part of the skin from your left vulva today.  She has prescribed you percocets to take as needed for pain (no more frequently than every 4 hours) and senakot to use to prevent constipation (once nightly).   Follow the instructions below to care for the area.   The vulva is the external female genitalia, outside and around the vagina and pubic bone. It consists of:  The skin on, and in front of, the pubic bone.  The clitoris.  The labia majora (large lips) on the outside of the vagina.  The labia minora (small lips) around the opening of the vagina.  The opening and the skin in and around the vagina. A vulvectomy is the removal of the tissue of the vulva, which sometimes includes removal of the lymph nodes and tissue in the groin areas. These discharge instructions provide you with general information on caring for yourself after you leave the hospital. It is also important that you know the warning signs of complications, so that you can seek treatment. Please read the instructions outlined below and refer to this sheet in the next few weeks. Your caregiver may also give you specific information and medicines. If you have any questions or complications after discharge, please call your caregiver. ACTIVITY  Rest as much as possible the first two weeks after discharge.  Arrange to have help from family or others with your daily activities when you go home.  Avoid heavy lifting (more than 5 pounds), pushing, or pulling.  If you feel tired, balance your activity with rest periods.  Follow your caregiver's instruction about climbing stairs and driving a car.  Increase activity gradually.  Do not exercise until you have permission from your caregiver. LEG AND FOOT CARE If your doctor has removed lymph nodes from your groin area, there may be an increase in swelling of your legs and feet. You can help prevent swelling by doing the  following:  Elevate your legs while sitting or lying down.  If your caregiver has ordered special stockings, wear them according to instructions.  Avoid standing in one place for long periods of time.  Call the physical therapy department if you have any questions about swelling or treatment for swelling.  Avoid salt in your diet. It can cause fluid retention and swelling.  Do not cross your legs, especially when sitting. NUTRITION  You may resume your normal diet.  Drink 6 to 8 glasses of fluids a day.  Eat a healthy, balanced diet including portions of food from the meat (protein), milk, fruit, vegetable, and bread groups.  Your caregiver may recommend you take a multivitamin with iron. ELIMINATION  You may notice that your stream of urine is at a different angle, and may tend to spray. Using a plastic funnel may help to decrease urine spray.  If constipation occurs, drink more liquids, and add more fruits, vegetables, and bran to your diet. You may take a mild laxative, such as Milk of Magnesia, Metamucil, or a stool softener such as Colace, with permission from your caregiver. HYGIENE  You may shower and wash your hair.  Check with your caregiver about tub baths.  Do not add any bath oils or chemicals to your bath water, after you have permission to take baths.  While passing urine, pour water from a bottle or spray over your vulva to dilute the urine as it passes the incision (this will decrease burning and discomfort).  Clean yourself well after moving your bowels.  After urinating, do not wipe. Dap or pat dry with toilet paper or a dry cleath soft cloth.  A sitz bath will help keep your perineal area clean, reduce swelling, and provide comfort.  Avoid wearing underpants for the first 2 weeks and wear loose skirts to allow circulation of air around the incision  You do not need to apply dressings, salves or lotions to the wound.  The stitches are self-dissolving  and will absorb and disappear over a couple of months (it is normal to notice the knot from the stitches on toilet paper after voiding). HOME CARE INSTRUCTIONS   Apply a soft ice pack (or frozen bag of peas) to your perineum (vulva) every hour in the first 48 hours after surgery. This will reduce swelling.  Avoid activities that involve a lot of friction between your legs.  Avoid wearing pants or underpants in the 1st 2 weeks (skirts are preferable).  Take your temperature twice a day and record it, especially if you feel feverish or have chills.  Follow your caregiver's instructions about medicines, activity, and follow-up appointments after surgery.  Do not drink alcohol while taking pain medicine.  Change your dressing as advised by your caregiver.  You may take over-the-counter medicine for pain, recommended by your caregiver.  If your pain is not relieved with medicine, call your caregiver.  Do not take aspirin because it can cause bleeding.  Do not douche or use tampons (use a nonperfumed sanitary pad).  Do not have sexual intercourse until your caregiver gives you permission (typically 6 weeks postoperatively). Hugging, kissing, and playful sexual activity is fine with your caregiver's permission.  Warm sitz baths, with your caregiver's permission, are helpful to control swelling and discomfort.  Take showers instead of baths, until your caregiver gives you permission to take baths.  You may take a mild medicine for constipation, recommended by your caregiver. Bran foods and drinking a lot of fluids will help with constipation.  Make sure your family understands everything about your operation and recovery. SEEK MEDICAL CARE IF:   You notice swelling and redness around the wound area.  You notice a foul smell coming from the wound or on the surgical dressing.  You notice the wound is separating.  You have painful or bloody urination.  You develop nausea and  vomiting.  You develop diarrhea.  You develop a rash.  You have a reaction or allergy from the medicine.  You feel dizzy or light-headed.  You need stronger pain medicine. SEEK IMMEDIATE MEDICAL CARE IF:   You develop a temperature of 102 F (38.9 C) or higher.  You pass out.  You develop leg or chest pain.  You develop abdominal pain.  You develop shortness of breath.  You develop bleeding from the wound area.  You see pus in the wound area. MAKE SURE YOU:   Understand these instructions.  Will watch your condition.  Will get help right away if you are not doing well or get worse. Document Released: 11/24/2003 Document Revised: 08/26/2013 Document Reviewed: 03/13/2009 [facility] Patient Information 2015 [place], [place]. This information is not intended to replace advice given to you by your health care provider. Make sure you discuss any questions you have with your health care provider.      Post Anesthesia Home Care Instructions  Activity: Get plenty of rest for the remainder of the day. A responsible individual must stay with you for 24 hours following the  procedure.  For the next 24 hours, DO NOT: -Drive a car -Paediatric nurse -Drink alcoholic beverages -Take any medication unless instructed by your physician -Make any legal decisions or sign important papers.  Meals: Start with liquid foods such as gelatin or soup. Progress to regular foods as tolerated. Avoid greasy, spicy, heavy foods. If nausea and/or vomiting occur, drink only clear liquids until the nausea and/or vomiting subsides. Call your physician if vomiting continues.  Special Instructions/Symptoms: Your throat may feel dry or sore from the anesthesia or the breathing tube placed in your throat during surgery. If this causes discomfort, gargle with warm salt water. The discomfort should disappear within 24 hours.  If you had a scopolamine patch placed behind your ear for the management of  post- operative nausea and/or vomiting:  1. The medication in the patch is effective for 72 hours, after which it should be removed.  Wrap patch in a tissue and discard in the trash. Wash hands thoroughly with soap and water. 2. You may remove the patch earlier than 72 hours if you experience unpleasant side effects which may include dry mouth, dizziness or visual disturbances. 3. Avoid touching the patch. Wash your hands with soap and water after contact with the patch.     May take ibuprofen after 9:30 pm if needed

## 2017-09-14 NOTE — Anesthesia Postprocedure Evaluation (Signed)
Anesthesia Post Note  Patient: Briana Wiley  Procedure(s) Performed: WIDE LOCAL EXCISION OF THE VULVA (N/A )     Patient location during evaluation: PACU Anesthesia Type: General Level of consciousness: sedated and patient cooperative Pain management: pain level controlled Vital Signs Assessment: post-procedure vital signs reviewed and stable Respiratory status: spontaneous breathing Cardiovascular status: stable Anesthetic complications: no    Last Vitals:  Vitals:   09/14/17 1615 09/14/17 1645  BP: (!) 143/80 (!) 159/86  Pulse: 74 60  Resp: 19 16  Temp:  36.7 C  SpO2: 96% 97%    Last Pain:  Vitals:   09/14/17 1645  TempSrc:   PainSc: 0-No pain                 Nolon Nations

## 2017-09-14 NOTE — Transfer of Care (Signed)
Immediate Anesthesia Transfer of Care Note  Patient: Briana Wiley  Procedure(s) Performed: WIDE LOCAL EXCISION OF THE VULVA (N/A )  Patient Location: PACU  Anesthesia Type:General  Level of Consciousness: awake, alert , oriented, drowsy and patient cooperative  Airway & Oxygen Therapy: Patient Spontanous Breathing and Patient connected to nasal cannula oxygen  Post-op Assessment: Report given to RN and Post -op Vital signs reviewed and stable  Post vital signs: Reviewed and stable  Last Vitals:  Vitals Value Taken Time  BP 149/81 09/14/2017  3:38 PM  Temp    Pulse 67 09/14/2017  3:41 PM  Resp 14 09/14/2017  3:41 PM  SpO2 100 % 09/14/2017  3:41 PM  Vitals shown include unvalidated device data.  Last Pain:  Vitals:   09/14/17 1246  TempSrc:   PainSc: 0-No pain      Patients Stated Pain Goal: 8 (83/15/17 6160)  Complications: No apparent anesthesia complications

## 2017-09-14 NOTE — Op Note (Signed)
PATIENT: Briana Wiley DATE: 09/14/17   Preop Diagnosis:   Postoperative Diagnosis:   Surgery: Partial simple left vulvectomy  Surgeons:  Donaciano Eva, MD Assistant: none  Anesthesia: General   Estimated blood loss: <30ml  IVF:  158ml   Urine output: 161 ml   Complications: None   Pathology: left labia majora with marking stitch at 12 o'clock  Operative findings: acetowhite changes and irregular verrucous lesions on left mid and anterior labia majora. There was a nodular acetowhite area on the right clitoral hood. Radiation changes present.   Procedure: The patient was identified in the preoperative holding area. Informed consent was signed on the chart. Patient was seen history was reviewed and exam was performed.   The patient was then taken to the operating room and placed in the supine position with SCD hose on. General anesthesia was then induced without difficulty. She was then placed in the dorsolithotomy position. The perineum was prepped with Betadine. The vagina was prepped with Betadine. The patient was then draped after the prep was dried. A Foley catheter was inserted into the bladder under sterile conditions.  Timeout was performed the patient, procedure, antibiotic, allergy, and length of procedure. 5% acetic acid solution was applied to the perineum. The vulvar tissues were inspected for areas of acetowhite changes or leukoplakia. The lesion was identified and the marking pen was used to circumscribe the area with appropriate surgical margins. The subcuticular tissues were infiltrated with 1% lidocaine. The 15 blade scalpel was used to make an incision through the skin circumferentially as marked. The skin elipse was grasped and was separated from the underlying deep dermal tissues with the bovie device. After the specimen had been completely resected, it was oriented and marked at 12 o'clock with a 0-vicryl suture. The bovie was used to obtain  hemostasis at the surgical bed. The subcutaneous tissues were irrigated and made hemostatic.   The deep dermal layer was approximated with 3-0vicryl mattress sutures to bring the skin edges into approximation and off tension. The wound was closed following langher's lines. The cutaneous layer was closed with interrupted 4-0 vicryl running stitches and mattress sutures to ensure a tension free and hemostatic closure. The perineum was again irrigated. The foley was removed.  All instrument, suture, laparotomy, Ray-Tec, and needle counts were correct x2. The patient tolerated the procedure well and was taken recovery room in stable condition. This is [name] dictating an operative note on [facility].

## 2017-09-14 NOTE — Interval H&P Note (Signed)
History and Physical Interval Note:  09/14/2017 2:01 PM  Briana Wiley  has presented today for surgery, with the diagnosis of VULVA DYSPLASIA  The various methods of treatment have been discussed with the patient and family. After consideration of risks, benefits and other options for treatment, the patient has consented to  Procedure(s): WIDE EXCISION VULVECTOMY (N/A) as a surgical intervention .  The patient's history has been reviewed, patient examined, no change in status, stable for surgery.  I have reviewed the patient's chart and labs.  Questions were answered to the patient's satisfaction.     Thereasa Solo

## 2017-09-14 NOTE — Anesthesia Preprocedure Evaluation (Signed)
Anesthesia Evaluation  Patient identified by MRN, date of birth, ID band Patient awake    Reviewed: Allergy & Precautions, NPO status , Patient's Chart, lab work & pertinent test results  Airway Mallampati: II  TM Distance: >3 FB Neck ROM: Full    Dental no notable dental hx. (+) Missing, Poor Dentition   Pulmonary shortness of breath and with exertion, Current Smoker,    Pulmonary exam normal breath sounds clear to auscultation       Cardiovascular hypertension, Pt. on medications + DOE  Normal cardiovascular exam Rhythm:Regular Rate:Normal     Neuro/Psych  Headaches, PSYCHIATRIC DISORDERS Depression Schizophrenia    GI/Hepatic negative GI ROS, Neg liver ROS,   Endo/Other  negative endocrine ROS  Renal/GU negative Renal ROS  negative genitourinary   Musculoskeletal negative musculoskeletal ROS (+)   Abdominal   Peds  Hematology negative hematology ROS (+)   Anesthesia Other Findings   Reproductive/Obstetrics (+) Pregnancy Vulvar Ca Positive uHCG                             Anesthesia Physical  Anesthesia Plan  ASA: III  Anesthesia Plan: General   Post-op Pain Management:    Induction: Intravenous  PONV Risk Score and Plan: 3  Airway Management Planned: LMA  Additional Equipment:   Intra-op Plan:   Post-operative Plan: Extubation in OR  Informed Consent: I have reviewed the patients History and Physical, chart, labs and discussed the procedure including the risks, benefits and alternatives for the proposed anesthesia with the patient or authorized representative who has indicated his/her understanding and acceptance.   Dental advisory given  Plan Discussed with: Anesthesiologist, CRNA and Surgeon  Anesthesia Plan Comments:         Anesthesia Quick Evaluation

## 2017-09-14 NOTE — Anesthesia Procedure Notes (Signed)
Procedure Name: LMA Insertion Date/Time: 09/14/2017 2:57 PM Performed by: Raenette Rover, CRNA Pre-anesthesia Checklist: Patient identified, Emergency Drugs available, Patient being monitored and Suction available Patient Re-evaluated:Patient Re-evaluated prior to induction Oxygen Delivery Method: Circle system utilized Preoxygenation: Pre-oxygenation with 100% oxygen Induction Type: IV induction LMA: LMA inserted LMA Size: 4.0 Number of attempts: 1 Placement Confirmation: positive ETCO2,  CO2 detector and breath sounds checked- equal and bilateral Tube secured with: Tape Dental Injury: Teeth and Oropharynx as per pre-operative assessment

## 2017-09-15 ENCOUNTER — Encounter (HOSPITAL_BASED_OUTPATIENT_CLINIC_OR_DEPARTMENT_OTHER): Payer: Self-pay | Admitting: Gynecologic Oncology

## 2017-09-19 ENCOUNTER — Other Ambulatory Visit: Payer: Self-pay | Admitting: Gynecologic Oncology

## 2017-09-19 DIAGNOSIS — G8918 Other acute postprocedural pain: Secondary | ICD-10-CM

## 2017-09-19 MED ORDER — OXYCODONE-ACETAMINOPHEN 5-325 MG PO TABS: 1.0000 | ORAL_TABLET | ORAL | 0 refills | Status: DC | PRN

## 2017-09-20 ENCOUNTER — Telehealth: Payer: Self-pay | Admitting: *Deleted

## 2017-09-20 NOTE — Telephone Encounter (Signed)
Called and spoke with Briana Wiley, moved post op appt from 6/6 to 6/5 at 11am.

## 2017-09-27 ENCOUNTER — Inpatient Hospital Stay: Payer: Medicare Other | Attending: Gynecologic Oncology | Admitting: Gynecologic Oncology

## 2017-09-27 ENCOUNTER — Encounter: Payer: Self-pay | Admitting: Gynecologic Oncology

## 2017-09-27 VITALS — BP 145/97 | HR 60 | Temp 97.5°F | Resp 20 | Ht 63.0 in | Wt 141.0 lb

## 2017-09-27 DIAGNOSIS — C519 Malignant neoplasm of vulva, unspecified: Secondary | ICD-10-CM | POA: Diagnosis not present

## 2017-09-27 DIAGNOSIS — Z923 Personal history of irradiation: Secondary | ICD-10-CM | POA: Insufficient documentation

## 2017-09-27 DIAGNOSIS — G8918 Other acute postprocedural pain: Secondary | ICD-10-CM

## 2017-09-27 DIAGNOSIS — F17210 Nicotine dependence, cigarettes, uncomplicated: Secondary | ICD-10-CM | POA: Insufficient documentation

## 2017-09-27 MED ORDER — OXYCODONE-ACETAMINOPHEN 5-325 MG PO TABS: 2.0000 | ORAL_TABLET | ORAL | 0 refills | Status: DC | PRN

## 2017-09-27 NOTE — Progress Notes (Signed)
Follow-up Note Note: Gyn-Onc  Consult was initially requested by Dr. Elonda Husky for the evaluation of Briana Wiley 47 y.o. 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   Chief Complaint  Patient presents with  . Vulvar cancer [facility])  recurrent vulvar cancer   Assessment/Plan:  Briana Wiley  is a 47 y.o.  year old who has chronic paranoid schizophrenia, tobacco abuse and recurrent vulvar SCC s/p primary radiation (primary therapy completed February 13, 2014).  s/p right radical vulvectomy.  Recurrence on left vulva in May, 2019 S/p simple partial left vulvectomy in May, 2019.  Wound separation - continue current therapies. Will see back in 3 weeks to monitor healing. Warned regarding signs of cellulitis/infection  VIN III with positive margins at clitoris (right) and left vulva - will need close follow-up.   HPI: Briana Wiley is a 47 year old woman who was seen in consultation at the request of Dr. Elonda Husky for clinical stage II vulvar squamous cell carcinoma. 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 She also has a history significant for paranoid schizophrenia and resides in her care facility for this. She is an extremely heavy smoker and continues to smoke 1-2 packs per day. She began experiencing vulvar irritation in the past few months. She was seen by Dr. Elonda Husky for this. He recognized the posterior 5 cm raised erythematous vulva mass and performed a biopsy. Pathology revealed at least high-grade VIN associated with an area highly suspicious for squamous cell carcinoma.  PET scan was performed which revealed positive inguinal and pelvic nodes.   She was treated with primary radiation with external beam radiation (45 gray to the pelvis and 55 gray with simulated integrated boost to the pelvic nodes). The patient then received a boost to the vulva area to a cumulative dose of 59.4 gray. Treatment dates were  12/26/13-02/13/14. She tolerated therapy well with no issues or delays.  She developed intermittent mild vulvar pain. Biopsy of vulva on 06/24/15 showed VIN III and she underwent CO2 laser with Dr. Denman George in April 2017. That was last time that she was seen by our service. She was seen by radiation oncology in December was noted to have a 1.5 cm ulcerative lesion on the labia and was subsequently sent in to see [place] today. She states that she believes that this lesion is a portal and a while was hurting and having some bleeding earlier is no longer hurting and she is no longer having any bleeding.  Biopsy on 04/27/16 with Dr Alycia Rossetti showed at least [place].   She failed to show for appointments after this biopsy. She reported increasing vulvar pain.  On 06/23/16 she was able to be scheduled for a radical right vulvectomy. Pathology confirmed invasive SCC associated with VIN III. VIN III extended to the medial and inferior margin, however the margins were negative for SCC.  She reported pruritis on left vulva in June, 2018. This was biopsied and showed VIN3. She was recommended surgery but declined because she was "tired of being cut on".  She was treated with effudex instead which she reports she has been using as directed between February, 2019 until May, 2019. The lesion remained pruritic and raised and did not respond.  Interval Hx: On 09/14/17 she underwent partial simple left vulvectomy and resection of a right periclitoral lesion. The left vulvectomy revealed a microinvasive squamous cell carcinoma (<67mm invasion) with negative margins for invasive cancer (positive  for VIN III), and the periclitoral lesion showed VIN III (positive margins).  Postop she has done well but reports increasing pain. She denies fevers or drainage.    Current Meds:  Outpatient Encounter Medications as of 09/27/2017  Medication Sig  . albuterol (PROVENTIL HFA;VENTOLIN HFA) 108 (90 BASE) MCG/ACT inhaler Inhale 2 puffs into the  lungs every 4 (four) hours as needed for wheezing or shortness of breath.  . benztropine (COGENTIN) 1 MG tablet Take 1 mg by mouth 2 (two) times daily.  . fluorouracil (EFUDEX) 5 % cream Apply topically 2 (two) times daily. Use 5% concentration cream Apply zinc oxide diaper rash ointment to the normal skin on the right Fill applicator (available for purchase online) to 1g mark and apply to the skin on the left vagina Use once a week at bedtime for 8 weeks and wash hands well afterward. You should also wear a pad. On rising in the morning, take a shower or bath right away. Clean the area well and apply more zinc oxide  . haloperidol (HALDOL) 5 MG tablet Take 5 mg by mouth at bedtime.  . haloperidol decanoate (HALDOL DECANOATE) 100 MG/ML injection Inject into the muscle every 21 ( twenty-one) days.   Marland Kitchen ibuprofen (ADVIL,MOTRIN) 400 MG tablet Take 400 mg by mouth every 6 (six) hours as needed.  Marland Kitchen oxyCODONE-acetaminophen (PERCOCET/ROXICET) 5-325 MG tablet Take 2 tablets by mouth every 4 (four) hours as needed for severe pain.  Marland Kitchen senna (SENOKOT) 8.6 MG TABS tablet Take 1 tablet (8.6 mg total) by mouth at bedtime as needed for mild constipation.  . solifenacin (VESICARE) 10 MG tablet Take 10 mg by mouth every morning.   . traZODone (DESYREL) 50 MG tablet Take 50 mg by mouth at bedtime. Takes 3 tabs at bedtime  . [DISCONTINUED] oxyCODONE-acetaminophen (PERCOCET/ROXICET) 5-325 MG tablet Take 1 tablet by mouth every 4 (four) hours as needed for severe pain.   No facility-administered encounter medications on file as of 09/27/2017.     Allergy:  Allergies  Allergen Reactions  . Chicken Meat (Diagnostic) Nausea Only    Social Hx:   Social History   Socioeconomic History  . Marital status: Single    Spouse name: Not on file  . Number of children: 3  . Years of education: Not on file  . Highest education level: Not on file  Occupational History    Employer: UNEMPLOYED  Social Needs  . Financial  resource strain: Not on file  . Food insecurity:    Worry: Not on file    Inability: Not on file  . Transportation needs:    Medical: Not on file    Non-medical: Not on file  Tobacco Use  . Smoking status: Current Every Day Smoker    Packs/day: 1.50    Years: 30.00    Pack years: 45.00    Types: Cigarettes  . Smokeless tobacco: Never Used  Substance and Sexual Activity  . Alcohol use: No  . Drug use: No  . Sexual activity: Never  Lifestyle  . Physical activity:    Days per week: Not on file    Minutes per session: Not on file  . Stress: Not on file  Relationships  . Social connections:    Talks on phone: Not on file    Gets together: Not on file    Attends religious service: Not on file    Active member of club or organization: Not on file    Attends meetings of clubs or  organizations: Not on file    Relationship status: Not on file  . Intimate partner violence:    Fear of current or ex partner: Not on file    Emotionally abused: Not on file    Physically abused: Not on file    Forced sexual activity: Not on file  Other Topics Concern  . Not on file  Social History Narrative  . Not on file    Past Surgical Hx:  Past Surgical History:  Procedure Laterality Date  . CO2 LASER APPLICATION N/A 06/30/486   Procedure: CO2 LASER OF THE VULVA;  Surgeon: [name], MD;  Location: [facility];  Service: Gynecology;  Laterality: N/A;  . COLD KNIFE CONIZATION OF CERVIX AND MULTPLE VULVA BX'S  05/14/2001  . D & C HYSTEROSCOPY W/ ENDOMETRIAL ABLATION   02/06/2008  . RADICAL VULVECTOMY Right 06/23/2016   Procedure: RADICAL VULVECTOMY;  Surgeon: [name], MD;  Location: [facility];  Service: Gynecology;  Laterality: Right;  . TUBAL LIGATION Bilateral yrs ago  . VULVA / PERINEUM BIOPSY  12/02/13  . VULVECTOMY N/A 09/14/2017   Procedure: WIDE LOCAL EXCISION OF THE VULVA;  Surgeon: [name], MD;  Location: [facility];  Service: Gynecology;  Laterality:  N/A;  . VULVECTOMY PARTIAL Bilateral 07/15/2002    Past Medical Hx:  Past Medical History:  Diagnosis Date  . Chronic paranoid schizophrenia ([place])   . Depression   . History of cancer of vulva    oncologist-  dr Denman George--- s/p  bilateral partial vulvectomy 07-15-2002;  completed external beam radiation 02-13-2014;  s/p  laser vulva dysplasia 07-28-2015;   s/p  radical vulvectomy 03/ 2018  . History of radiation therapy 12-26-2013  to 02-13-2014   45gray ro pelvis, pelvic nodes boosted to 55gray, vulvar area boosted to 59.4gray  . VIN III (vulvar intraepithelial neoplasia III)    recurrent  . Wears partial dentures     Past Gynecological History:  G3P3, SVD x3  No LMP recorded. Patient has had an ablation.  Family Hx:  Family History  Problem Relation Age of Onset  . Seizures Mother   . Hypertension Sister    Vitals:  Blood pressure (!) 145/97, pulse 60, temperature (!) 97.5 F (36.4 C), temperature source Oral, resp. rate 20, height 5\' 3"  (1.6 m), weight 141 lb (64 kg), SpO2 100 %.  Physical Exam: WD in NAD  Groins: Negative for lymphadenopathy.  Genito Urinary  Vulva/vagina: incision on left vulva opened, with clean granulation tissue at base, Trimmed suture material. Right peri-clitoral incision healing normally. No signs of infection.   Thereasa Solo, MD   09/27/2017, 12:04 PM

## 2017-09-27 NOTE — Patient Instructions (Signed)
Continue sitz baths and showers.  Dr. Denman George is prescribing Percocet for severe pain.  Plan to follow up in three weeks or sooner if needed.

## 2017-09-28 ENCOUNTER — Ambulatory Visit: Payer: Medicare Other | Admitting: Gynecologic Oncology

## 2017-10-20 ENCOUNTER — Encounter: Payer: Self-pay | Admitting: Gynecologic Oncology

## 2017-10-20 ENCOUNTER — Inpatient Hospital Stay (HOSPITAL_BASED_OUTPATIENT_CLINIC_OR_DEPARTMENT_OTHER): Payer: Medicare Other | Admitting: Gynecologic Oncology

## 2017-10-20 VITALS — BP 105/58 | HR 64 | Temp 98.3°F | Resp 20 | Ht 66.0 in | Wt 139.0 lb

## 2017-10-20 DIAGNOSIS — C519 Malignant neoplasm of vulva, unspecified: Secondary | ICD-10-CM | POA: Diagnosis not present

## 2017-10-20 DIAGNOSIS — Z923 Personal history of irradiation: Secondary | ICD-10-CM | POA: Diagnosis not present

## 2017-10-20 DIAGNOSIS — G8918 Other acute postprocedural pain: Secondary | ICD-10-CM

## 2017-10-20 DIAGNOSIS — F17210 Nicotine dependence, cigarettes, uncomplicated: Secondary | ICD-10-CM

## 2017-10-20 MED ORDER — OXYCODONE-ACETAMINOPHEN 5-325 MG PO TABS: 2.0000 | ORAL_TABLET | ORAL | 0 refills | Status: DC | PRN

## 2017-10-20 NOTE — Patient Instructions (Addendum)
Please return to see Dr Denman George in 6 months for inspection of the vulva. Return sooner if you develop new concerning lesions or bumps on the vulva skin.  Dr Denman George prescribed 20 more tablets of oxycodone but will not be able to prescribe additional doses.   Call [phone] with questions.

## 2017-10-20 NOTE — Progress Notes (Signed)
Follow-up Note Note: Gyn-Onc  Consult was initially requested by Dr. Elonda Husky for the evaluation of Briana Wiley 47 y.o. 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   Chief Complaint  Patient presents with  . Vulvar cancer [facility])  recurrent vulvar cancer   Assessment/Plan:  Briana Wiley  is a 47 y.o.  year old who has chronic paranoid schizophrenia, tobacco abuse and recurrent vulvar SCC s/p primary radiation (primary therapy completed February 13, 2014).  s/p right radical vulvectomy.  Recurrence on left vulva in May, 2019 S/p simple partial left vulvectomy in May, 2019.  Wound separation - continue current therapies. Will see back in 6 months.  VIN III with positive margins at clitoris (right) and left vulva - will need close follow-up.   HPI: Briana Wiley is a 47 year old woman who was seen in consultation at the request of Dr. Elonda Husky for clinical stage II vulvar squamous cell carcinoma. 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 She also has a history significant for paranoid schizophrenia and resides in her care facility for this. She is an extremely heavy smoker and continues to smoke 1-2 packs per day. She began experiencing vulvar irritation in the past few months. She was seen by Dr. Elonda Husky for this. He recognized the posterior 5 cm raised erythematous vulva mass and performed a biopsy. Pathology revealed at least high-grade VIN associated with an area highly suspicious for squamous cell carcinoma.  PET scan was performed which revealed positive inguinal and pelvic nodes.   She was treated with primary radiation with external beam radiation (45 gray to the pelvis and 55 gray with simulated integrated boost to the pelvic nodes). The patient then received a boost to the vulva area to a cumulative dose of 59.4 gray. Treatment dates were 12/26/13-02/13/14. She tolerated therapy well with no issues or delays.  She  developed intermittent mild vulvar pain. Biopsy of vulva on 06/24/15 showed VIN III and she underwent CO2 laser with Dr. Denman George in April 2017. That was last time that she was seen by our service. She was seen by radiation oncology in December was noted to have a 1.5 cm ulcerative lesion on the labia and was subsequently sent in to see [place] today. She states that she believes that this lesion is a portal and a while was hurting and having some bleeding earlier is no longer hurting and she is no longer having any bleeding.  Biopsy on 04/27/16 with Dr Alycia Rossetti showed at least [place].   She failed to show for appointments after this biopsy. She reported increasing vulvar pain.  On 06/23/16 she was able to be scheduled for a radical right vulvectomy. Pathology confirmed invasive SCC associated with VIN III. VIN III extended to the medial and inferior margin, however the margins were negative for SCC.  She reported pruritis on left vulva in June, 2018. This was biopsied and showed VIN3. She was recommended surgery but declined because she was "tired of being cut on".  She was treated with effudex instead which she reports she has been using as directed between February, 2019 until May, 2019. The lesion remained pruritic and raised and did not respond. On 09/14/17 she underwent partial simple left vulvectomy and resection of a right periclitoral lesion. The left vulvectomy revealed a microinvasive squamous cell carcinoma (<73mm invasion) with negative margins for invasive cancer (positive for VIN III), and the periclitoral lesion showed VIN III (positive  margins).   Interval Hx: Postop she developed some wound separation and is seen back for wound evaluation.    Current Meds:  Outpatient Encounter Medications as of 10/20/2017  Medication Sig  . albuterol (PROVENTIL HFA;VENTOLIN HFA) 108 (90 BASE) MCG/ACT inhaler Inhale 2 puffs into the lungs every 4 (four) hours as needed for wheezing or shortness of breath.  .  benztropine (COGENTIN) 1 MG tablet Take 1 mg by mouth 2 (two) times daily.  . fluorouracil (EFUDEX) 5 % cream Apply topically 2 (two) times daily. Use 5% concentration cream Apply zinc oxide diaper rash ointment to the normal skin on the right Fill applicator (available for purchase online) to 1g mark and apply to the skin on the left vagina Use once a week at bedtime for 8 weeks and wash hands well afterward. You should also wear a pad. On rising in the morning, take a shower or bath right away. Clean the area well and apply more zinc oxide  . haloperidol (HALDOL) 5 MG tablet Take 5 mg by mouth at bedtime.  . haloperidol decanoate (HALDOL DECANOATE) 100 MG/ML injection Inject into the muscle every 21 ( twenty-one) days.   Marland Kitchen ibuprofen (ADVIL,MOTRIN) 400 MG tablet Take 400 mg by mouth every 6 (six) hours as needed.  Marland Kitchen oxyCODONE-acetaminophen (PERCOCET/ROXICET) 5-325 MG tablet Take 2 tablets by mouth every 4 (four) hours as needed for severe pain.  Marland Kitchen senna (SENOKOT) 8.6 MG TABS tablet Take 1 tablet (8.6 mg total) by mouth at bedtime as needed for mild constipation.  . solifenacin (VESICARE) 10 MG tablet Take 10 mg by mouth every morning.   . traZODone (DESYREL) 50 MG tablet Take 50 mg by mouth at bedtime. Takes 3 tabs at bedtime  . [DISCONTINUED] oxyCODONE-acetaminophen (PERCOCET/ROXICET) 5-325 MG tablet Take 2 tablets by mouth every 4 (four) hours as needed for severe pain.   No facility-administered encounter medications on file as of 10/20/2017.     Allergy:  Allergies  Allergen Reactions  . Chicken Meat (Diagnostic) Nausea Only    Social Hx:   Social History   Socioeconomic History  . Marital status: Single    Spouse name: Not on file  . Number of children: 3  . Years of education: Not on file  . Highest education level: Not on file  Occupational History    Employer: UNEMPLOYED  Social Needs  . Financial resource strain: Not on file  . Food insecurity:    Worry: Not on file     Inability: Not on file  . Transportation needs:    Medical: Not on file    Non-medical: Not on file  Tobacco Use  . Smoking status: Current Every Day Smoker    Packs/day: 1.50    Years: 30.00    Pack years: 45.00    Types: Cigarettes  . Smokeless tobacco: Never Used  Substance and Sexual Activity  . Alcohol use: No  . Drug use: No  . Sexual activity: Never  Lifestyle  . Physical activity:    Days per week: Not on file    Minutes per session: Not on file  . Stress: Not on file  Relationships  . Social connections:    Talks on phone: Not on file    Gets together: Not on file    Attends religious service: Not on file    Active member of club or organization: Not on file    Attends meetings of clubs or organizations: Not on file    Relationship status:  Not on file  . Intimate partner violence:    Fear of current or ex partner: Not on file    Emotionally abused: Not on file    Physically abused: Not on file    Forced sexual activity: Not on file  Other Topics Concern  . Not on file  Social History Narrative  . Not on file    Past Surgical Hx:  Past Surgical History:  Procedure Laterality Date  . CO2 LASER APPLICATION N/A 10/24/5364   Procedure: CO2 LASER OF THE VULVA;  Surgeon: [name], MD;  Location: [facility];  Service: Gynecology;  Laterality: N/A;  . COLD KNIFE CONIZATION OF CERVIX AND MULTPLE VULVA BX'S  05/14/2001  . D & C HYSTEROSCOPY W/ ENDOMETRIAL ABLATION   02/06/2008  . RADICAL VULVECTOMY Right 06/23/2016   Procedure: RADICAL VULVECTOMY;  Surgeon: [name], MD;  Location: [facility];  Service: Gynecology;  Laterality: Right;  . TUBAL LIGATION Bilateral yrs ago  . VULVA / PERINEUM BIOPSY  12/02/13  . VULVECTOMY N/A 09/14/2017   Procedure: WIDE LOCAL EXCISION OF THE VULVA;  Surgeon: [name], MD;  Location: [facility];  Service: Gynecology;  Laterality: N/A;  . VULVECTOMY PARTIAL Bilateral 07/15/2002    Past Medical Hx:  Past  Medical History:  Diagnosis Date  . Chronic paranoid schizophrenia ([place])   . Depression   . History of cancer of vulva    oncologist-  dr Denman George--- s/p  bilateral partial vulvectomy 07-15-2002;  completed external beam radiation 02-13-2014;  s/p  laser vulva dysplasia 07-28-2015;   s/p  radical vulvectomy 03/ 2018  . History of radiation therapy 12-26-2013  to 02-13-2014   45gray ro pelvis, pelvic nodes boosted to 55gray, vulvar area boosted to 59.4gray  . VIN III (vulvar intraepithelial neoplasia III)    recurrent  . Wears partial dentures     Past Gynecological History:  G3P3, SVD x3  No LMP recorded. Patient has had an ablation.  Family Hx:  Family History  Problem Relation Age of Onset  . Seizures Mother   . Hypertension Sister    Vitals:  Blood pressure (!) 105/58, pulse 64, temperature 98.3 F (36.8 C), temperature source Oral, resp. rate 20, height 5\' 6"  (1.676 m), weight 139 lb (63 kg), SpO2 100 %.  Physical Exam: WD in NAD  Groins: Negative for lymphadenopathy.  Genito Urinary  Vulva/vagina: incision opened but epithelialized with clean granulation tissue at base,  Right peri-clitoral incision healed. No signs of infection.   Thereasa Solo, MD   10/20/2017, 4:16 PM

## 2017-11-10 DIAGNOSIS — F209 Schizophrenia, unspecified: Secondary | ICD-10-CM | POA: Diagnosis not present

## 2017-11-10 DIAGNOSIS — F17200 Nicotine dependence, unspecified, uncomplicated: Secondary | ICD-10-CM | POA: Diagnosis not present

## 2017-11-10 DIAGNOSIS — J410 Simple chronic bronchitis: Secondary | ICD-10-CM | POA: Diagnosis not present

## 2018-01-01 DIAGNOSIS — H2513 Age-related nuclear cataract, bilateral: Secondary | ICD-10-CM | POA: Diagnosis not present

## 2018-01-03 DIAGNOSIS — F209 Schizophrenia, unspecified: Secondary | ICD-10-CM | POA: Diagnosis not present

## 2018-01-24 DIAGNOSIS — F209 Schizophrenia, unspecified: Secondary | ICD-10-CM | POA: Diagnosis not present

## 2018-01-26 ENCOUNTER — Other Ambulatory Visit (HOSPITAL_COMMUNITY): Payer: Self-pay | Admitting: Internal Medicine

## 2018-01-26 DIAGNOSIS — Z1231 Encounter for screening mammogram for malignant neoplasm of breast: Secondary | ICD-10-CM

## 2018-02-12 ENCOUNTER — Ambulatory Visit (HOSPITAL_COMMUNITY): Payer: Medicare Other

## 2018-02-16 DIAGNOSIS — J410 Simple chronic bronchitis: Secondary | ICD-10-CM | POA: Diagnosis not present

## 2018-02-16 DIAGNOSIS — Z23 Encounter for immunization: Secondary | ICD-10-CM | POA: Diagnosis not present

## 2018-02-16 DIAGNOSIS — F17200 Nicotine dependence, unspecified, uncomplicated: Secondary | ICD-10-CM | POA: Diagnosis not present

## 2018-02-16 DIAGNOSIS — F209 Schizophrenia, unspecified: Secondary | ICD-10-CM | POA: Diagnosis not present

## 2018-02-28 ENCOUNTER — Ambulatory Visit (HOSPITAL_COMMUNITY)
Admission: RE | Admit: 2018-02-28 | Discharge: 2018-02-28 | Disposition: A | Discharge status: Home / Self Care | Payer: Medicare Other | Source: Ambulatory Visit | Attending: Internal Medicine | Admitting: Internal Medicine

## 2018-02-28 DIAGNOSIS — Z1231 Encounter for screening mammogram for malignant neoplasm of breast: Secondary | ICD-10-CM | POA: Insufficient documentation

## 2018-03-29 DIAGNOSIS — F209 Schizophrenia, unspecified: Secondary | ICD-10-CM | POA: Diagnosis not present

## 2018-04-06 ENCOUNTER — Inpatient Hospital Stay: Payer: Medicare Other | Attending: Gynecologic Oncology | Admitting: Gynecologic Oncology

## 2018-04-17 DIAGNOSIS — Z1331 Encounter for screening for depression: Secondary | ICD-10-CM | POA: Diagnosis not present

## 2018-04-17 DIAGNOSIS — F17200 Nicotine dependence, unspecified, uncomplicated: Secondary | ICD-10-CM | POA: Diagnosis not present

## 2018-04-17 DIAGNOSIS — R32 Unspecified urinary incontinence: Secondary | ICD-10-CM | POA: Diagnosis not present

## 2018-04-17 DIAGNOSIS — J410 Simple chronic bronchitis: Secondary | ICD-10-CM | POA: Diagnosis not present

## 2018-04-17 DIAGNOSIS — Z1389 Encounter for screening for other disorder: Secondary | ICD-10-CM | POA: Diagnosis not present

## 2018-04-17 DIAGNOSIS — F209 Schizophrenia, unspecified: Secondary | ICD-10-CM | POA: Diagnosis not present

## 2018-04-30 DIAGNOSIS — Z859 Personal history of malignant neoplasm, unspecified: Secondary | ICD-10-CM | POA: Diagnosis not present

## 2018-04-30 DIAGNOSIS — J410 Simple chronic bronchitis: Secondary | ICD-10-CM | POA: Diagnosis not present

## 2018-04-30 DIAGNOSIS — F209 Schizophrenia, unspecified: Secondary | ICD-10-CM | POA: Diagnosis not present

## 2018-04-30 DIAGNOSIS — R32 Unspecified urinary incontinence: Secondary | ICD-10-CM | POA: Diagnosis not present

## 2018-04-30 DIAGNOSIS — F17200 Nicotine dependence, unspecified, uncomplicated: Secondary | ICD-10-CM | POA: Diagnosis not present

## 2018-04-30 DIAGNOSIS — D4959 Neoplasm of unspecified behavior of other genitourinary organ: Secondary | ICD-10-CM | POA: Diagnosis not present

## 2018-04-30 DIAGNOSIS — J309 Allergic rhinitis, unspecified: Secondary | ICD-10-CM | POA: Diagnosis not present

## 2018-05-17 DIAGNOSIS — F209 Schizophrenia, unspecified: Secondary | ICD-10-CM | POA: Diagnosis not present

## 2018-06-07 DIAGNOSIS — F209 Schizophrenia, unspecified: Secondary | ICD-10-CM | POA: Diagnosis not present

## 2018-06-13 DIAGNOSIS — F209 Schizophrenia, unspecified: Secondary | ICD-10-CM | POA: Diagnosis not present

## 2018-06-28 DIAGNOSIS — F209 Schizophrenia, unspecified: Secondary | ICD-10-CM | POA: Diagnosis not present

## 2018-07-02 DIAGNOSIS — M2041 Other hammer toe(s) (acquired), right foot: Secondary | ICD-10-CM | POA: Diagnosis not present

## 2018-07-02 DIAGNOSIS — B351 Tinea unguium: Secondary | ICD-10-CM | POA: Diagnosis not present

## 2018-07-19 DIAGNOSIS — J410 Simple chronic bronchitis: Secondary | ICD-10-CM | POA: Diagnosis not present

## 2018-07-19 DIAGNOSIS — F209 Schizophrenia, unspecified: Secondary | ICD-10-CM | POA: Diagnosis not present

## 2018-08-09 DIAGNOSIS — F209 Schizophrenia, unspecified: Secondary | ICD-10-CM | POA: Diagnosis not present

## 2018-08-30 DIAGNOSIS — F209 Schizophrenia, unspecified: Secondary | ICD-10-CM | POA: Diagnosis not present

## 2018-09-20 DIAGNOSIS — F209 Schizophrenia, unspecified: Secondary | ICD-10-CM | POA: Diagnosis not present

## 2018-10-11 DIAGNOSIS — F209 Schizophrenia, unspecified: Secondary | ICD-10-CM | POA: Diagnosis not present

## 2018-10-19 DIAGNOSIS — F209 Schizophrenia, unspecified: Secondary | ICD-10-CM | POA: Diagnosis not present

## 2018-10-19 DIAGNOSIS — J410 Simple chronic bronchitis: Secondary | ICD-10-CM | POA: Diagnosis not present

## 2018-10-19 DIAGNOSIS — F17200 Nicotine dependence, unspecified, uncomplicated: Secondary | ICD-10-CM | POA: Diagnosis not present

## 2018-11-08 DIAGNOSIS — F209 Schizophrenia, unspecified: Secondary | ICD-10-CM | POA: Diagnosis not present

## 2018-11-29 DIAGNOSIS — F209 Schizophrenia, unspecified: Secondary | ICD-10-CM | POA: Diagnosis not present

## 2018-12-20 DIAGNOSIS — F209 Schizophrenia, unspecified: Secondary | ICD-10-CM | POA: Diagnosis not present

## 2019-01-10 DIAGNOSIS — F209 Schizophrenia, unspecified: Secondary | ICD-10-CM | POA: Diagnosis not present

## 2019-01-11 ENCOUNTER — Other Ambulatory Visit (HOSPITAL_COMMUNITY): Payer: Self-pay | Admitting: Internal Medicine

## 2019-01-11 DIAGNOSIS — Z1231 Encounter for screening mammogram for malignant neoplasm of breast: Secondary | ICD-10-CM

## 2019-01-31 DIAGNOSIS — F209 Schizophrenia, unspecified: Secondary | ICD-10-CM | POA: Diagnosis not present

## 2019-02-21 DIAGNOSIS — F209 Schizophrenia, unspecified: Secondary | ICD-10-CM | POA: Diagnosis not present

## 2019-03-04 ENCOUNTER — Ambulatory Visit (HOSPITAL_COMMUNITY): Payer: Medicare Other

## 2019-03-14 DIAGNOSIS — F209 Schizophrenia, unspecified: Secondary | ICD-10-CM | POA: Diagnosis not present

## 2019-04-04 DIAGNOSIS — F209 Schizophrenia, unspecified: Secondary | ICD-10-CM | POA: Diagnosis not present

## 2019-04-25 ENCOUNTER — Ambulatory Visit (HOSPITAL_COMMUNITY): Payer: Medicaid Other

## 2019-04-25 DIAGNOSIS — F209 Schizophrenia, unspecified: Secondary | ICD-10-CM | POA: Diagnosis not present

## 2019-05-16 DIAGNOSIS — F209 Schizophrenia, unspecified: Secondary | ICD-10-CM | POA: Diagnosis not present

## 2019-05-18 DIAGNOSIS — D4959 Neoplasm of unspecified behavior of other genitourinary organ: Secondary | ICD-10-CM | POA: Diagnosis not present

## 2019-05-18 DIAGNOSIS — F209 Schizophrenia, unspecified: Secondary | ICD-10-CM | POA: Diagnosis not present

## 2019-05-31 ENCOUNTER — Telehealth: Payer: Self-pay | Admitting: Adult Health

## 2019-05-31 NOTE — Telephone Encounter (Signed)

## 2019-06-03 ENCOUNTER — Other Ambulatory Visit (HOSPITAL_COMMUNITY)
Admission: RE | Admit: 2019-06-03 | Discharge: 2019-06-03 | Disposition: A | Discharge status: Home / Self Care | Payer: Medicare Other | Source: Ambulatory Visit | Attending: Adult Health | Admitting: Adult Health

## 2019-06-03 ENCOUNTER — Ambulatory Visit (INDEPENDENT_AMBULATORY_CARE_PROVIDER_SITE_OTHER): Payer: Medicare Other | Admitting: Adult Health

## 2019-06-03 ENCOUNTER — Encounter: Payer: Self-pay | Admitting: Adult Health

## 2019-06-03 ENCOUNTER — Other Ambulatory Visit: Payer: Self-pay

## 2019-06-03 VITALS — BP 132/83 | HR 86 | Ht 61.0 in | Wt 142.0 lb

## 2019-06-03 DIAGNOSIS — Z1211 Encounter for screening for malignant neoplasm of colon: Secondary | ICD-10-CM

## 2019-06-03 DIAGNOSIS — Z1151 Encounter for screening for human papillomavirus (HPV): Secondary | ICD-10-CM | POA: Insufficient documentation

## 2019-06-03 DIAGNOSIS — Z01419 Encounter for gynecological examination (general) (routine) without abnormal findings: Secondary | ICD-10-CM | POA: Diagnosis not present

## 2019-06-03 DIAGNOSIS — C519 Malignant neoplasm of vulva, unspecified: Secondary | ICD-10-CM | POA: Insufficient documentation

## 2019-06-03 DIAGNOSIS — Z124 Encounter for screening for malignant neoplasm of cervix: Secondary | ICD-10-CM | POA: Diagnosis not present

## 2019-06-03 NOTE — Progress Notes (Addendum)
Patient ID: Briana Wiley, female   DOB: [birth year], 49 y.o.   MRN: [id number] History of Present Illness:  Briana Wiley is a 49 year old white female, single, G3P3, lives in [place], in for pelvic and pap.She was last seen in 2015, and found to have vulva cancer and was sent to Dr Denman George then.  PCP is Dr Legrand Rams.  Current Medications, Allergies, Past Medical History, Past Surgical History, Family History and Social History were reviewed in [employer] record.     Review of Systems: Patient denies any headaches, hearing loss, fatigue, blurred vision, shortness of breath, chest pain, abdominal pain, problems with bowel movements, urination, or intercourse(not having sex). No joint pain or mood swings. She still smokes.    Physical Exam:BP 132/83 (BP Location: Left Arm, Patient Position: Sitting, Cuff Size: Normal)   Pulse 86   Ht 5\' 1"  (1.549 m)   Wt 142 lb (64.4 kg)   BMI 26.83 kg/m  General:  Well developed, well nourished, no acute distress Skin:  Warm and dry Neck:  Midline trachea, normal thyroid, good ROM, no lymphadenopathy Lungs; Clear to auscultation bilaterally Cardiovascular: Regular rate and rhythm Pelvic:  External genitalia is sp vulvectomy  on the right, skin is  smooth no lesions, on the left there is an area of thick red skin, non tender  The vagina is normal in appearance for age, has creamy discharge, without odor. Urethra has no lesions or masses. The cervix is smooth with stenotic os, and friable with pap brush, pap performed with GC/CHL and high risk HPV 16/18 genotyping.  Uterus is felt to be normal size, shape, and contour.  No adnexal masses or tenderness noted.Bladder is non tender, no masses felt.Dr Elonda Husky in for co exam and agrees looks like vulva cancer. Rectal: Good sphincter tone, no polyps, or hemorrhoids felt.  Hemoccult negative. Psych:  Alert and cooperative,seems happy Fall risk is low PHQ 9 score is 4, is on meds, denies being  suicidal  Examination chaperoned by Levy Pupa LPN   Impression and Plan: 1. Routine cervical smear Pap sent   2. Encounter for gynecological examination with Papanicolaou smear of cervix Pap sent Pelvic exam in 1 year Pap in 3 if normal Mammogram yearly  3. Screening for colorectal cancer Colonoscopy per GI   4. Recurrent vulvar cancer [facility]) Refer to Dr Denman George   5. Vulva cancer [facility]) Refer to Dr Denman George

## 2019-06-04 ENCOUNTER — Telehealth: Payer: Self-pay | Admitting: *Deleted

## 2019-06-04 NOTE — Telephone Encounter (Signed)
Called and left the patient's caregiver [name] a message to call the office back. Need to send the patient for a follow up appt

## 2019-06-05 ENCOUNTER — Telehealth: Payer: Self-pay | Admitting: *Deleted

## 2019-06-05 NOTE — Telephone Encounter (Signed)
Attempted to call Briana Wiley to schedule an appt, mailbox was full

## 2019-06-06 ENCOUNTER — Encounter: Payer: Self-pay | Admitting: Adult Health

## 2019-06-06 ENCOUNTER — Telehealth: Payer: Self-pay | Admitting: Adult Health

## 2019-06-06 ENCOUNTER — Telehealth: Payer: Self-pay | Admitting: *Deleted

## 2019-06-06 DIAGNOSIS — R87810 Cervical high risk human papillomavirus (HPV) DNA test positive: Secondary | ICD-10-CM

## 2019-06-06 DIAGNOSIS — F209 Schizophrenia, unspecified: Secondary | ICD-10-CM | POA: Diagnosis not present

## 2019-06-06 DIAGNOSIS — A599 Trichomoniasis, unspecified: Secondary | ICD-10-CM | POA: Insufficient documentation

## 2019-06-06 HISTORY — DX: Trichomoniasis, unspecified: A59.9

## 2019-06-06 HISTORY — DX: Cervical high risk human papillomavirus (HPV) DNA test positive: R87.810

## 2019-06-06 MED ORDER — METRONIDAZOLE 500 MG PO TABS: 500.0000 mg | ORAL_TABLET | Freq: Two times a day (BID) | ORAL | 0 refills | Status: DC

## 2019-06-06 NOTE — Telephone Encounter (Signed)
Spoke With [place] and Sharesa has + trich on pap, will rx flagyl and has +HPV repeat pap and HPV in 1 year and told her to call the cancer center for appt, has they have been unable to reach her, and Nimra needs to see Dr Denman George for recurrent vulva cancer

## 2019-06-06 NOTE — Telephone Encounter (Signed)
Patient's care giver [name] called and scheduled an appt

## 2019-06-10 LAB — CYTOLOGY - PAP
Chlamydia: NEGATIVE
Comment: NEGATIVE
Comment: NEGATIVE
Comment: NEGATIVE
Comment: NORMAL
Diagnosis: NEGATIVE
Diagnosis: REACTIVE
HPV 16: NEGATIVE
HPV 18 / 45: NEGATIVE
High risk HPV: POSITIVE — AB
Neisseria Gonorrhea: NEGATIVE

## 2019-06-18 DIAGNOSIS — F209 Schizophrenia, unspecified: Secondary | ICD-10-CM | POA: Diagnosis not present

## 2019-06-18 DIAGNOSIS — R32 Unspecified urinary incontinence: Secondary | ICD-10-CM | POA: Diagnosis not present

## 2019-06-20 DIAGNOSIS — F209 Schizophrenia, unspecified: Secondary | ICD-10-CM | POA: Diagnosis not present

## 2019-06-25 ENCOUNTER — Inpatient Hospital Stay: Payer: Medicare Other | Attending: Gynecologic Oncology | Admitting: Gynecologic Oncology

## 2019-06-25 DIAGNOSIS — D071 Carcinoma in situ of vulva: Secondary | ICD-10-CM | POA: Insufficient documentation

## 2019-06-25 DIAGNOSIS — Z79899 Other long term (current) drug therapy: Secondary | ICD-10-CM | POA: Insufficient documentation

## 2019-06-25 DIAGNOSIS — Z8544 Personal history of malignant neoplasm of other female genital organs: Secondary | ICD-10-CM | POA: Insufficient documentation

## 2019-06-25 DIAGNOSIS — F200 Paranoid schizophrenia: Secondary | ICD-10-CM | POA: Insufficient documentation

## 2019-06-25 DIAGNOSIS — Z791 Long term (current) use of non-steroidal anti-inflammatories (NSAID): Secondary | ICD-10-CM | POA: Insufficient documentation

## 2019-06-25 DIAGNOSIS — F17210 Nicotine dependence, cigarettes, uncomplicated: Secondary | ICD-10-CM | POA: Insufficient documentation

## 2019-06-25 DIAGNOSIS — Z923 Personal history of irradiation: Secondary | ICD-10-CM | POA: Insufficient documentation

## 2019-06-25 DIAGNOSIS — F329 Major depressive disorder, single episode, unspecified: Secondary | ICD-10-CM | POA: Insufficient documentation

## 2019-06-25 DIAGNOSIS — Z8249 Family history of ischemic heart disease and other diseases of the circulatory system: Secondary | ICD-10-CM | POA: Insufficient documentation

## 2019-06-27 DIAGNOSIS — F209 Schizophrenia, unspecified: Secondary | ICD-10-CM | POA: Diagnosis not present

## 2019-06-28 ENCOUNTER — Telehealth: Payer: Self-pay | Admitting: *Deleted

## 2019-06-28 NOTE — Telephone Encounter (Signed)
Called and spoke with [facility], rescheduled patient's missed appt from 3/2 to 3/16

## 2019-07-09 ENCOUNTER — Other Ambulatory Visit: Payer: Self-pay

## 2019-07-09 ENCOUNTER — Inpatient Hospital Stay (HOSPITAL_BASED_OUTPATIENT_CLINIC_OR_DEPARTMENT_OTHER): Payer: Medicare Other | Admitting: Gynecologic Oncology

## 2019-07-09 ENCOUNTER — Encounter: Payer: Self-pay | Admitting: Gynecologic Oncology

## 2019-07-09 VITALS — BP 155/91 | HR 69 | Temp 98.7°F | Resp 18 | Ht 61.0 in | Wt 143.4 lb

## 2019-07-09 DIAGNOSIS — Z791 Long term (current) use of non-steroidal anti-inflammatories (NSAID): Secondary | ICD-10-CM | POA: Diagnosis not present

## 2019-07-09 DIAGNOSIS — F329 Major depressive disorder, single episode, unspecified: Secondary | ICD-10-CM | POA: Diagnosis not present

## 2019-07-09 DIAGNOSIS — Z79899 Other long term (current) drug therapy: Secondary | ICD-10-CM | POA: Diagnosis not present

## 2019-07-09 DIAGNOSIS — D071 Carcinoma in situ of vulva: Secondary | ICD-10-CM

## 2019-07-09 DIAGNOSIS — Z8544 Personal history of malignant neoplasm of other female genital organs: Secondary | ICD-10-CM | POA: Diagnosis not present

## 2019-07-09 DIAGNOSIS — F200 Paranoid schizophrenia: Secondary | ICD-10-CM | POA: Diagnosis not present

## 2019-07-09 DIAGNOSIS — Z923 Personal history of irradiation: Secondary | ICD-10-CM | POA: Diagnosis not present

## 2019-07-09 DIAGNOSIS — Z8249 Family history of ischemic heart disease and other diseases of the circulatory system: Secondary | ICD-10-CM | POA: Diagnosis not present

## 2019-07-09 DIAGNOSIS — F17210 Nicotine dependence, cigarettes, uncomplicated: Secondary | ICD-10-CM | POA: Diagnosis not present

## 2019-07-09 NOTE — Progress Notes (Signed)
Follow-up Note Note: Gyn-Onc  Consult was initially requested by Dr. Elonda Husky for the evaluation of Briana Wiley 49 y.o. 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   Chief Complaint  Patient presents with  . VIN III (vulvar intraepithelial neoplasia III)    Follow up  recurrent vulvar cancer   Assessment/Plan:  Briana Wiley  is a 49 y.o.  year old who has chronic paranoid schizophrenia, tobacco abuse and recurrent vulvar SCC s/p primary radiation (primary therapy completed February 13, 2014).  s/p right radical vulvectomy.  Recurrence on left vulva in May, 2019 S/p simple partial left vulvectomy in May, 2019.  VIN 3 vs invasive carcinoma seen today. Will follow-up today's biopsy. If invasive ca she will require a more radical procedure. If VIN 3 we will perform wide local excision of the vulva. I favor that this is pre-invasive.   HPI: Briana Wiley is a 49 year old woman who was seen in consultation at the request of Dr. Elonda Husky for clinical stage II vulvar squamous cell carcinoma. 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 She also has a history significant for paranoid schizophrenia and resides in her care facility for this. She is an extremely heavy smoker and continues to smoke 1-2 packs per day. She began experiencing vulvar irritation in the past few months. She was seen by Dr. Elonda Husky for this. He recognized the posterior 5 cm raised erythematous vulva mass and performed a biopsy. Pathology revealed at least high-grade VIN associated with an area highly suspicious for squamous cell carcinoma.  PET scan was performed which revealed positive inguinal and pelvic nodes.   She was treated with primary radiation with external beam radiation (45 gray to the pelvis and 55 gray with simulated integrated boost to the pelvic nodes). The patient then received a boost to the vulva area to a cumulative dose of 59.4 gray.  Treatment dates were 12/26/13-02/13/14. She tolerated therapy well with no issues or delays.  She developed intermittent mild vulvar pain. Biopsy of vulva on 06/24/15 showed VIN III and she underwent CO2 laser with Dr. Denman George in April 2017. That was last time that she was seen by our service. She was seen by radiation oncology in December was noted to have a 1.5 cm ulcerative lesion on the labia and was subsequently sent in to see [place] today. She states that she believes that this lesion is a portal and a while was hurting and having some bleeding earlier is no longer hurting and she is no longer having any bleeding.  Biopsy on 04/27/16 with Dr Alycia Rossetti showed at least [place].   She failed to show for appointments after this biopsy. She reported increasing vulvar pain.  On 06/23/16 she was able to be scheduled for a radical right vulvectomy. Pathology confirmed invasive SCC associated with VIN III. VIN III extended to the medial and inferior margin, however the margins were negative for SCC.  She reported pruritis on left vulva in June, 2018. This was biopsied and showed VIN3. She was recommended surgery but declined because she was "tired of being cut on".  She was treated with effudex instead which she reports she has been using as directed between February, 2019 until May, 2019. The lesion remained pruritic and raised and did not respond. On 09/14/17 she underwent partial simple left vulvectomy and resection of a right periclitoral lesion. The left vulvectomy revealed a microinvasive squamous cell carcinoma (<27mm invasion)  with negative margins for invasive cancer (positive for VIN III), and the periclitoral lesion showed VIN III (positive margins).   Interval Hx: Postop she appeared once for follow-up then was lost to follow-up. She saw Dr Elonda Husky locally for vulvar irritation in February, 2021 and a left sided lesion was noted which he thought looked like vulvar cancer.Biopsy was not performed.   Current  Meds:  Outpatient Encounter Medications as of 07/09/2019  Medication Sig  . albuterol (PROVENTIL HFA;VENTOLIN HFA) 108 (90 BASE) MCG/ACT inhaler Inhale 2 puffs into the lungs every 4 (four) hours as needed for wheezing or shortness of breath.  . benztropine (COGENTIN) 1 MG tablet Take 1 mg by mouth 2 (two) times daily.  . haloperidol (HALDOL) 5 MG tablet Take 5 mg by mouth at bedtime.  . haloperidol decanoate (HALDOL DECANOATE) 100 MG/ML injection Inject into the muscle every 21 ( twenty-one) days.   Marland Kitchen ibuprofen (ADVIL,MOTRIN) 400 MG tablet Take 400 mg by mouth every 6 (six) hours as needed.  . metroNIDAZOLE (FLAGYL) 500 MG tablet Take 1 tablet (500 mg total) by mouth 2 (two) times daily.  . solifenacin (VESICARE) 10 MG tablet Take 10 mg by mouth every morning.   . traZODone (DESYREL) 50 MG tablet Take 50 mg by mouth at bedtime. Takes 3 tabs at bedtime   No facility-administered encounter medications on file as of 07/09/2019.    Allergy:  Allergies  Allergen Reactions  . Chicken Meat (Diagnostic) Nausea Only    Social Hx:   Social History   Socioeconomic History  . Marital status: Single    Spouse name: Not on file  . Number of children: 3  . Years of education: Not on file  . Highest education level: Not on file  Occupational History    Employer: UNEMPLOYED  Tobacco Use  . Smoking status: Current Every Day Smoker    Packs/day: 1.50    Years: 30.00    Pack years: 45.00    Types: Cigarettes  . Smokeless tobacco: Never Used  Substance and Sexual Activity  . Alcohol use: No  . Drug use: No  . Sexual activity: Not Currently    Birth control/protection: Surgical    Comment: tubal and ablation  Other Topics Concern  . Not on file  Social History Narrative  . Not on file   Social Determinants of Health   Financial Resource Strain:   . Difficulty of Paying Living Expenses:   Food Insecurity:   . Worried About Charity fundraiser in the Last Year:   . Arboriculturist in  the Last Year:   Transportation Needs:   . Film/video editor (Medical):   Marland Kitchen Lack of Transportation (Non-Medical):   Physical Activity:   . Days of Exercise per Week:   . Minutes of Exercise per Session:   Stress:   . Feeling of Stress :   Social Connections:   . Frequency of Communication with Friends and Family:   . Frequency of Social Gatherings with Friends and Family:   . Attends Religious Services:   . Active Member of Clubs or Organizations:   . Attends Archivist Meetings:   Marland Kitchen Marital Status:   Intimate Partner Violence:   . Fear of Current or Ex-Partner:   . Emotionally Abused:   Marland Kitchen Physically Abused:   . Sexually Abused:     Past Surgical Hx:  Past Surgical History:  Procedure Laterality Date  . CO2 LASER APPLICATION N/A [id number]  Procedure: CO2 LASER OF THE VULVA;  Surgeon: [name], MD;  Location: [facility];  Service: Gynecology;  Laterality: N/A;  . COLD KNIFE CONIZATION OF CERVIX AND MULTPLE VULVA BX'S  05/14/2001  . D & C HYSTEROSCOPY W/ ENDOMETRIAL ABLATION   02/06/2008  . RADICAL VULVECTOMY Right 06/23/2016   Procedure: RADICAL VULVECTOMY;  Surgeon: [name], MD;  Location: [facility];  Service: Gynecology;  Laterality: Right;  . TUBAL LIGATION Bilateral yrs ago  . VULVA / PERINEUM BIOPSY  12/02/13  . VULVECTOMY N/A 09/14/2017   Procedure: WIDE LOCAL EXCISION OF THE VULVA;  Surgeon: [name], MD;  Location: [facility];  Service: Gynecology;  Laterality: N/A;  . VULVECTOMY PARTIAL Bilateral 07/15/2002    Past Medical Hx:  Past Medical History:  Diagnosis Date  . Chronic paranoid schizophrenia ([place])   . Depression   . History of cancer of vulva    oncologist-  dr Denman George--- s/p  bilateral partial vulvectomy 07-15-2002;  completed external beam radiation 02-13-2014;  s/p  laser vulva dysplasia 07-28-2015;   s/p  radical vulvectomy 03/ 2018  . History of radiation therapy 12-26-2013  to 02-13-2014   45gray ro  pelvis, pelvic nodes boosted to 55gray, vulvar area boosted to 59.4gray  . Papanicolaou smear of cervix with positive high risk human papilloma virus (HPV) test 06/06/2019   Repeat pap in 1 year  . Trichimoniasis 06/06/2019   Treated 06/06/19 with flagyl  . Vaginal Pap smear, abnormal   . VIN III (vulvar intraepithelial neoplasia III)    recurrent  . Wears partial dentures     Past Gynecological History:  G3P3, SVD x3  No LMP recorded. Patient has had an ablation.  Family Hx:  Family History  Problem Relation Age of Onset  . Seizures Mother   . Hypertension Sister    Vitals:  Blood pressure (!) 155/91, pulse 69, temperature 98.7 F (37.1 C), temperature source Temporal, resp. rate 18, height 5\' 1"  (1.549 m), weight 143 lb 6.4 oz (65 kg), SpO2 100 %.  Physical Exam: WD in NAD  Groins: Negative for lymphadenopathy.  Genito Urinary  Vulva/vagina: 3cm area of leukoplakia and induration on left posterior labia majora. Looks most consistent with VIN 3. Biopsy performed.  Procedure Note:  Preop Dx: VIN 3 and hx of Vulvar cancer, vulvar mass Postop Dx: same Procedure: punch biopsy of left posterior vulva Surgeon: Dorann Ou, MD EBL: <5cc Specimens: left posterior labia majora Complications: none Procedure Details: Patient provided verbal consent and verbal timeout was performed.  The left vulva was prepped with Betadine.  The area of interest was infiltrated with 1% lidocaine a total of 1 cc.  When anesthesia had been obtained a 2 mm punch biopsy was used to take a punch from the representative area of the lesion where it appeared the most raised and thickened.  This was excised and sent for permanent pathology.  Hemostasis was obtained with silver nitrate sticks.  The patient tolerated the procedure well.    Thereasa Solo, MD   07/09/2019, 12:44 PM

## 2019-07-09 NOTE — Patient Instructions (Signed)
We will contact you with the results of your biopsy from today.    Plan to have a wide local excision of the vulva at the [facility] on March 25. They will contact you with additional information about the procedure including when to arrive and about arranging a COVID test.  Please call the office for any concerns or questions.

## 2019-07-10 ENCOUNTER — Telehealth: Payer: Self-pay

## 2019-07-10 LAB — SURGICAL PATHOLOGY

## 2019-07-10 NOTE — Telephone Encounter (Signed)
-----   Message from Dorothyann Gibbs, NP sent at 07/10/2019 11:58 AM EDT ----- Can you let her know that her bx showed VIN III (precancer) so plan for [facility] next week

## 2019-07-10 NOTE — Telephone Encounter (Signed)
Told caregiver [name] the results of the biopsy as noted below by Melissa Cross,NP.

## 2019-07-12 NOTE — Progress Notes (Signed)
Spoke with Briana Wiley of group home, patient signs own consents, requested medication record and medical history to be faxedto [phone].

## 2019-07-15 ENCOUNTER — Encounter (HOSPITAL_BASED_OUTPATIENT_CLINIC_OR_DEPARTMENT_OTHER): Payer: Self-pay | Admitting: Gynecologic Oncology

## 2019-07-15 ENCOUNTER — Other Ambulatory Visit: Payer: Self-pay

## 2019-07-15 NOTE — Progress Notes (Signed)
Preop instructions for:    Briana Wiley            Date of Birth  06-03-1970                          Date of Procedure:  07-18-2019     Doctor:dr rossi Time to arrive at [facility] 630 am (our address in [place])    Do not eat or drink past midnight the night before your procedure.(To include any tube feedings-must be discontinued)    Take these morning medications only with sips of water.(or give through gastrostomy or feeding tube).albuterol inhaler if needed and bring inhaler, cogentin, vesicare    Facility contact:  Levie Heritage   Phone:  [phone] fax [phone]                Health Care POA: patient signs own consents per Levie Heritage  Transportation contact phone#: [phone]  Please send day of procedure:current med list and meds last taken that day, confirm nothing by mouth status from what time, Patient Demographic info( to include DNR status, problem list, allergies)   RN contact name/phone#: Zelphia Cairo RN phone [phone]                             and Fax [phone]  [place] card and picture ID Leave all jewelry and other valuables at place where living( no metal or rings to be worn) No contact lens Women-no make-up, no lotions,perfumes,powders Men-no colognes,lotions  Any questions day of procedure,call  [place] [phone]   Sent from :[facility]                   [phone]                  Fax:[phone]  Sent by : Zelphia Cairo   RN

## 2019-07-15 NOTE — Progress Notes (Addendum)
Spoke w/ via phone for pre-op interview---Briana Wiley  Lab needs dos----  Cbc, bmet , urine preg             COVID test ------rapid covid 07-18-2019 Arrive at -------630 am 07-18-2019 NPO after ------midnight Medications to take morning of surgery ----inhaler if needed abd bring inhaler, cogentin, vesicare- Diabetic medication -----n/a Patient Special Instructions -----none Pre-Op special Istructions -----none Patient verbalized understanding of instructions that were given at this phone interview. Patient denies shortness of breath, chest pain, fever, cough a this phone interview.  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 Faxed pre op instructions to [phone] and fax confirmation received and placed on chart.  Spoke with Lenna Sciara cross rn and made aware patient would be same day labs.

## 2019-07-16 DIAGNOSIS — D4959 Neoplasm of unspecified behavior of other genitourinary organ: Secondary | ICD-10-CM | POA: Diagnosis not present

## 2019-07-16 DIAGNOSIS — F209 Schizophrenia, unspecified: Secondary | ICD-10-CM | POA: Diagnosis not present

## 2019-07-17 ENCOUNTER — Telehealth: Payer: Self-pay

## 2019-07-17 NOTE — Telephone Encounter (Signed)
Ms Briana Wiley states that Ms Briana Wiley does not want the surgery at this time. She is tired of being cut on. Had a three way conversation with [name], Ms Briana Wiley, and this nurse. Discussed reasons for moving forward with surgery to prevent cancer from forming and be more serious later and would involve treatments and could cause her to ultimitly loose her life.  Ms Briana Wiley states that she would come tomorrow. Above reviewed with Dr. Denman Wiley and Briana John, NP. Ms Briana Wiley has the surgery center's phone number [phone] if Ms Briana Wiley wants to cancel surgery tomorrow. Ms Briana Wiley understands the pre op instructions.

## 2019-07-18 ENCOUNTER — Other Ambulatory Visit (HOSPITAL_COMMUNITY): Payer: Medicaid Other

## 2019-07-18 ENCOUNTER — Encounter (HOSPITAL_COMMUNITY): Payer: Self-pay | Admitting: Anesthesiology

## 2019-07-18 ENCOUNTER — Ambulatory Visit (HOSPITAL_BASED_OUTPATIENT_CLINIC_OR_DEPARTMENT_OTHER): Admission: RE | Admit: 2019-07-18 | Payer: Medicare Other | Source: Ambulatory Visit | Admitting: Gynecologic Oncology

## 2019-07-18 DIAGNOSIS — F209 Schizophrenia, unspecified: Secondary | ICD-10-CM | POA: Diagnosis not present

## 2019-07-18 HISTORY — DX: Unspecified urinary incontinence: R32

## 2019-07-18 HISTORY — DX: Personal history of other diseases of the respiratory system: Z87.09

## 2019-07-18 SURGERY — WIDE EXCISION VULVECTOMY
Anesthesia: General

## 2019-07-18 NOTE — Anesthesia Preprocedure Evaluation (Deleted)
Anesthesia Evaluation    Reviewed: Allergy & Precautions, Patient's Chart, lab work & pertinent test results  Airway        Dental  (+) Missing, Poor Dentition   Pulmonary shortness of breath and with exertion, Current Smoker,    breath sounds clear to auscultation       Cardiovascular hypertension, Pt. on medications + DOE       Neuro/Psych  Headaches, PSYCHIATRIC DISORDERS Depression Schizophrenia    GI/Hepatic negative GI ROS, Neg liver ROS,   Endo/Other  negative endocrine ROS  Renal/GU negative Renal ROS  negative genitourinary   Musculoskeletal negative musculoskeletal ROS (+)   Abdominal   Peds  Hematology negative hematology ROS (+)   Anesthesia Other Findings   Reproductive/Obstetrics Vulvar Ca S/P pelvic radiation                             Anesthesia Physical  Anesthesia Plan  ASA: III  Anesthesia Plan: General   Post-op Pain Management:    Induction: Intravenous  PONV Risk Score and Plan: 3 and Ondansetron, Dexamethasone and Scopolamine patch - Pre-op  Airway Management Planned: LMA  Additional Equipment:   Intra-op Plan:   Post-operative Plan: Extubation in OR  Informed Consent:   Plan Discussed with: Anesthesiologist  Anesthesia Plan Comments:         Anesthesia Quick Evaluation

## 2019-07-19 ENCOUNTER — Telehealth: Payer: Self-pay

## 2019-07-19 DIAGNOSIS — N399 Disorder of urinary system, unspecified: Secondary | ICD-10-CM | POA: Diagnosis not present

## 2019-07-19 DIAGNOSIS — Z0001 Encounter for general adult medical examination with abnormal findings: Secondary | ICD-10-CM | POA: Diagnosis not present

## 2019-07-19 DIAGNOSIS — R32 Unspecified urinary incontinence: Secondary | ICD-10-CM | POA: Diagnosis not present

## 2019-07-19 DIAGNOSIS — E785 Hyperlipidemia, unspecified: Secondary | ICD-10-CM | POA: Diagnosis not present

## 2019-07-19 NOTE — Telephone Encounter (Signed)
Spoke with guardian Levie Heritage and rescheduled her surgery to 08-13-19.  [name] will be notified by pre op nurse a few days prior to Ms Guerrier's surgery.  Melissa Cross,NP notified of date.

## 2019-08-05 NOTE — Progress Notes (Signed)
Requested most recent medication record from beverly rucker to be faxed to [phone].

## 2019-08-07 NOTE — Progress Notes (Addendum)
Spoke with dr Denman George nurse and made aware patient lives at group home and must be rapid covid day of surgery and time needs to be changed back to 1030 am for 08-13-2019 surgery.

## 2019-08-07 NOTE — Progress Notes (Signed)
Spoke with beverly rucker and requested most recent mar to be faxed to [phone].

## 2019-08-08 DIAGNOSIS — F209 Schizophrenia, unspecified: Secondary | ICD-10-CM | POA: Diagnosis not present

## 2019-08-09 ENCOUNTER — Telehealth: Payer: Self-pay

## 2019-08-09 NOTE — Telephone Encounter (Signed)
Briana Wiley stated that 1045 surgery time on 08-15-19 would be fine. Pre surgical testing will call with updated information as to when Briana Wiley needs to arrive to get ready for surgery. Joylene John, NP notified.

## 2019-08-09 NOTE — Progress Notes (Signed)
Spoke with beverly rucker and requested most recent mar to be faxed to [phone].

## 2019-08-12 ENCOUNTER — Ambulatory Visit: Payer: Medicaid Other | Admitting: Gynecologic Oncology

## 2019-08-12 ENCOUNTER — Other Ambulatory Visit: Payer: Self-pay

## 2019-08-12 ENCOUNTER — Encounter (HOSPITAL_BASED_OUTPATIENT_CLINIC_OR_DEPARTMENT_OTHER): Payer: Self-pay | Admitting: Gynecologic Oncology

## 2019-08-12 NOTE — Progress Notes (Signed)
   Preop instructions for: Briana Wiley     Date of Birth   08-07-2020                         Date of Procedure:  08-15-2019      Doctor: dr Denman George Time to arrive at [facility]: 645 am (our address in [place] )      Do not eat or drink past midnight the night before your procedure.(To include any tube feedings-must be discontinued)    Take these morning medications only with sips of water.(or give through gastrostomy or feeding tube).Albuterol inhaler if needed and bring inhaler, Cogentin, oxybutynin     Facility contact:  Levie Heritage    Phone:  [phone]  Fax [phone]               Health Care IA:[id number] signs own consents per beverly rucker  Transportation contact phone#: [phone]  Please send day of procedure:current med list and meds last taken that day, confirm nothing by mouth status from what time, Patient Demographic info( to include DNR status, problem list, allergies)   RN contact name/phone#:   shaeron Kristain Filo rn phone [phone]                         and Fax [phone]  [employer] card and picture ID Leave all jewelry and other valuables at place where living( no metal or rings to be worn) No contact lens Women-no make-up, no lotions,perfumes,powders Men-no colognes,lotions  Any questions day of procedure,call  [place]-[phone]   Sent from :[facility]                   [place]                   Fax:[phone]  Sent by :  Zelphia Cairo             RN

## 2019-08-14 ENCOUNTER — Telehealth: Payer: Self-pay

## 2019-08-14 NOTE — Progress Notes (Signed)
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 Briana Wiley Lab needs dos----cbc, bmet, urine preg              COVID test ------645 am 08-15-2019 rapid test Arrive at -------645 am 08-15-2019 NPO after ------midnight Medications to take morning of surgery -----albuterol inhaler if needed and bring inhaler, cogentin, oxybutynin Diabetic medication -----n/a Patient Special Instructions -----faxed pre op instructions to Briana Wiley fax [phone], spoke with Briana Wiley by phone all faxed instructions receeived and understood for 08-15-2019 surgery Pre-Op special Istructions ----- Patient verbalized understanding of instructions that were given at this phone interview. Patient denies shortness of breath, chest pain, fever, cough a this phone interview.

## 2019-08-14 NOTE — Telephone Encounter (Signed)
Briana Wiley states that she understands the pre op instructions. Briana Wiley states that Briana Kooistra  keeps saying that she will go on Thursday to have her surgery.

## 2019-08-15 ENCOUNTER — Ambulatory Visit (HOSPITAL_BASED_OUTPATIENT_CLINIC_OR_DEPARTMENT_OTHER)
Admission: RE | Admit: 2019-08-15 | Discharge: 2019-08-15 | Disposition: A | Discharge status: Home / Self Care | Payer: Medicare Other | Attending: Gynecologic Oncology | Admitting: Gynecologic Oncology

## 2019-08-15 ENCOUNTER — Encounter (HOSPITAL_BASED_OUTPATIENT_CLINIC_OR_DEPARTMENT_OTHER): Payer: Self-pay | Admitting: Gynecologic Oncology

## 2019-08-15 ENCOUNTER — Ambulatory Visit (HOSPITAL_BASED_OUTPATIENT_CLINIC_OR_DEPARTMENT_OTHER): Payer: Medicare Other | Admitting: Anesthesiology

## 2019-08-15 ENCOUNTER — Encounter (HOSPITAL_BASED_OUTPATIENT_CLINIC_OR_DEPARTMENT_OTHER)
Admission: RE | Disposition: A | Discharge status: Home / Self Care | Payer: Self-pay | Source: Home / Self Care | Attending: Gynecologic Oncology

## 2019-08-15 DIAGNOSIS — F329 Major depressive disorder, single episode, unspecified: Secondary | ICD-10-CM | POA: Insufficient documentation

## 2019-08-15 DIAGNOSIS — D071 Carcinoma in situ of vulva: Secondary | ICD-10-CM | POA: Diagnosis not present

## 2019-08-15 DIAGNOSIS — F17210 Nicotine dependence, cigarettes, uncomplicated: Secondary | ICD-10-CM | POA: Insufficient documentation

## 2019-08-15 DIAGNOSIS — Z20822 Contact with and (suspected) exposure to covid-19: Secondary | ICD-10-CM | POA: Diagnosis not present

## 2019-08-15 DIAGNOSIS — Z91018 Allergy to other foods: Secondary | ICD-10-CM | POA: Diagnosis not present

## 2019-08-15 DIAGNOSIS — Z923 Personal history of irradiation: Secondary | ICD-10-CM | POA: Diagnosis not present

## 2019-08-15 DIAGNOSIS — C519 Malignant neoplasm of vulva, unspecified: Secondary | ICD-10-CM | POA: Diagnosis present

## 2019-08-15 DIAGNOSIS — Z8544 Personal history of malignant neoplasm of other female genital organs: Secondary | ICD-10-CM | POA: Insufficient documentation

## 2019-08-15 DIAGNOSIS — N762 Acute vulvitis: Secondary | ICD-10-CM | POA: Diagnosis not present

## 2019-08-15 DIAGNOSIS — F200 Paranoid schizophrenia: Secondary | ICD-10-CM | POA: Insufficient documentation

## 2019-08-15 DIAGNOSIS — N9089 Other specified noninflammatory disorders of vulva and perineum: Secondary | ICD-10-CM

## 2019-08-15 DIAGNOSIS — F209 Schizophrenia, unspecified: Secondary | ICD-10-CM | POA: Diagnosis present

## 2019-08-15 DIAGNOSIS — Z79899 Other long term (current) drug therapy: Secondary | ICD-10-CM | POA: Insufficient documentation

## 2019-08-15 HISTORY — PX: VULVECTOMY: SHX1086

## 2019-08-15 LAB — BASIC METABOLIC PANEL
Anion gap: 10 (ref 5–15)
BUN: 12 mg/dL (ref 6–20)
CO2: 26 mmol/L (ref 22–32)
Calcium: 9.4 mg/dL (ref 8.9–10.3)
Chloride: 105 mmol/L (ref 98–111)
Creatinine, Ser: 0.76 mg/dL (ref 0.44–1.00)
GFR calc Af Amer: >60 mL/min (ref >60)
GFR calc non Af Amer: >60 mL/min (ref >60)
Glucose, Bld: 106 mg/dL — ABNORMAL HIGH (ref 70–99)
Potassium: 3.9 mmol/L (ref 3.5–5.1)
Sodium: 141 mmol/L (ref 135–145)

## 2019-08-15 LAB — CBC
HCT: 46.2 % — ABNORMAL HIGH (ref 36.0–46.0)
Hemoglobin: 15.3 g/dL — ABNORMAL HIGH (ref 12.0–15.0)
MCH: 30.4 pg (ref 26.0–34.0)
MCHC: 33.1 g/dL (ref 30.0–36.0)
MCV: 91.8 fL (ref 80.0–100.0)
Platelets: 173 10*3/uL (ref 150–400)
RBC: 5.03 MIL/uL (ref 3.87–5.11)
RDW: 14.6 % (ref 11.5–15.5)
WBC: 8.1 10*3/uL (ref 4.0–10.5)
nRBC: 0 % (ref 0.0–0.2)

## 2019-08-15 LAB — RESPIRATORY PANEL BY RT PCR (FLU A&B, COVID)
Influenza A by PCR: NEGATIVE
Influenza B by PCR: NEGATIVE
SARS Coronavirus 2 by RT PCR: NEGATIVE

## 2019-08-15 LAB — POCT PREGNANCY, URINE: Preg Test, Ur: NEGATIVE

## 2019-08-15 SURGERY — WIDE EXCISION VULVECTOMY
Anesthesia: General | Site: Vulva

## 2019-08-15 MED ORDER — PROPOFOL 10 MG/ML IV BOLUS
INTRAVENOUS | Status: DC | PRN
  Administered 2019-08-15: 140 mg via INTRAVENOUS

## 2019-08-15 MED ORDER — MIDAZOLAM HCL 5 MG/5ML IJ SOLN
INTRAMUSCULAR | Status: DC | PRN
  Administered 2019-08-15: 2 mg via INTRAVENOUS

## 2019-08-15 MED ORDER — ACETAMINOPHEN 500 MG PO TABS
ORAL_TABLET | ORAL | Status: AC
  Filled 2019-08-15: qty 2

## 2019-08-15 MED ORDER — CEFAZOLIN SODIUM-DEXTROSE 2-4 GM/100ML-% IV SOLN
2.0000 g | INTRAVENOUS | Status: AC
  Administered 2019-08-15: 2 g via INTRAVENOUS

## 2019-08-15 MED ORDER — LACTATED RINGERS IV SOLN: INTRAVENOUS | Status: DC

## 2019-08-15 MED ORDER — ACETIC ACID 5 % SOLN
Status: DC | PRN
  Administered 2019-08-15: 1 via TOPICAL

## 2019-08-15 MED ORDER — FENTANYL CITRATE (PF) 100 MCG/2ML IJ SOLN
INTRAMUSCULAR | Status: DC | PRN
  Administered 2019-08-15: 50 ug via INTRAVENOUS
  Administered 2019-08-15: 100 ug via INTRAVENOUS

## 2019-08-15 MED ORDER — ONDANSETRON HCL 4 MG/2ML IJ SOLN: 4.0000 mg | Freq: Once | INTRAMUSCULAR | Status: DC | PRN

## 2019-08-15 MED ORDER — SENNOSIDES-DOCUSATE SODIUM 8.6-50 MG PO TABS: 2.0000 | ORAL_TABLET | Freq: Every day | ORAL | 0 refills | Status: AC

## 2019-08-15 MED ORDER — LIDOCAINE HCL 1 % IJ SOLN
INTRAMUSCULAR | Status: DC | PRN
  Administered 2019-08-15: 100 mg via INTRADERMAL

## 2019-08-15 MED ORDER — DEXAMETHASONE SODIUM PHOSPHATE 10 MG/ML IJ SOLN
INTRAMUSCULAR | Status: AC
  Filled 2019-08-15: qty 1

## 2019-08-15 MED ORDER — IBUPROFEN 800 MG PO TABS: 800.0000 mg | ORAL_TABLET | Freq: Three times a day (TID) | ORAL | 0 refills | Status: AC | PRN

## 2019-08-15 MED ORDER — ONDANSETRON HCL 4 MG/2ML IJ SOLN
INTRAMUSCULAR | Status: AC
  Filled 2019-08-15: qty 2

## 2019-08-15 MED ORDER — CEFAZOLIN SODIUM-DEXTROSE 2-4 GM/100ML-% IV SOLN
INTRAVENOUS | Status: AC
  Filled 2019-08-15: qty 100

## 2019-08-15 MED ORDER — BUPIVACAINE HCL 0.25 % IJ SOLN
INTRAMUSCULAR | Status: DC | PRN
  Administered 2019-08-15: 10 mL

## 2019-08-15 MED ORDER — OXYCODONE HCL 5 MG PO TABS: 5.0000 mg | ORAL_TABLET | Freq: Once | ORAL | Status: DC | PRN

## 2019-08-15 MED ORDER — FENTANYL CITRATE (PF) 250 MCG/5ML IJ SOLN
INTRAMUSCULAR | Status: AC
  Filled 2019-08-15: qty 5

## 2019-08-15 MED ORDER — ACETAMINOPHEN 500 MG PO TABS
1000.0000 mg | ORAL_TABLET | ORAL | Status: AC
  Administered 2019-08-15: 1000 mg via ORAL

## 2019-08-15 MED ORDER — OXYCODONE HCL 5 MG PO TABS: 5.0000 mg | ORAL_TABLET | ORAL | 0 refills | Status: DC | PRN

## 2019-08-15 MED ORDER — MIDAZOLAM HCL 2 MG/2ML IJ SOLN
INTRAMUSCULAR | Status: AC
  Filled 2019-08-15: qty 2

## 2019-08-15 MED ORDER — OXYCODONE HCL 5 MG/5ML PO SOLN: 5.0000 mg | Freq: Once | ORAL | Status: DC | PRN

## 2019-08-15 MED ORDER — LIDOCAINE 2% (20 MG/ML) 5 ML SYRINGE
INTRAMUSCULAR | Status: AC
  Filled 2019-08-15: qty 5

## 2019-08-15 MED ORDER — BUPIVACAINE LIPOSOME 1.3 % IJ SUSP
INTRAMUSCULAR | Status: DC | PRN
  Administered 2019-08-15: 20 mL

## 2019-08-15 MED ORDER — ONDANSETRON HCL 4 MG/2ML IJ SOLN
INTRAMUSCULAR | Status: DC | PRN
  Administered 2019-08-15: 4 mg via INTRAVENOUS

## 2019-08-15 MED ORDER — DEXAMETHASONE SODIUM PHOSPHATE 4 MG/ML IJ SOLN
INTRAMUSCULAR | Status: DC | PRN
  Administered 2019-08-15: 4 mg via INTRAVENOUS

## 2019-08-15 MED ORDER — FENTANYL CITRATE (PF) 100 MCG/2ML IJ SOLN: 25.0000 ug | INTRAMUSCULAR | Status: DC | PRN

## 2019-08-15 MED ORDER — PROPOFOL 10 MG/ML IV BOLUS
INTRAVENOUS | Status: AC
  Filled 2019-08-15: qty 20

## 2019-08-15 SURGICAL SUPPLY — 35 items
BLADE CLIPPER SENSICLIP SURGIC (BLADE) IMPLANT
BLADE SURG 15 STRL LF DISP TIS (BLADE) ×1 IMPLANT
BLADE SURG 15 STRL SS (BLADE) ×2
CANISTER SUCT 3000ML PPV (MISCELLANEOUS) ×2 IMPLANT
CATH ROBINSON RED A/P 14FR (CATHETERS) ×2 IMPLANT
COVER WAND RF STERILE (DRAPES) ×2 IMPLANT
DRAPE UNDERBUTTOCKS STRL (DISPOSABLE) ×1 IMPLANT
GAUZE 4X4 16PLY RFD (DISPOSABLE) ×2 IMPLANT
GLOVE BIO SURGEON STRL SZ 6 (GLOVE) ×4 IMPLANT
GLOVE BIO SURGEON STRL SZ 6.5 (GLOVE) ×2 IMPLANT
GLOVE BIO SURGEON STRL SZ7.5 (GLOVE) ×1 IMPLANT
GLOVE BIOGEL PI IND STRL 7.0 (GLOVE) IMPLANT
GLOVE BIOGEL PI IND STRL 7.5 (GLOVE) IMPLANT
GLOVE BIOGEL PI INDICATOR 7.0 (GLOVE) ×2
GLOVE BIOGEL PI INDICATOR 7.5 (GLOVE) ×3
GLOVE ECLIPSE 7.5 STRL STRAW (GLOVE) ×1 IMPLANT
GLOVE INDICATOR 6.5 STRL GRN (GLOVE) ×1 IMPLANT
GOWN STRL REUS W/TWL LRG LVL3 (GOWN DISPOSABLE) ×5 IMPLANT
KIT TURNOVER CYSTO (KITS) ×2 IMPLANT
NDL HYPO 25X1 1.5 SAFETY (NEEDLE) ×1 IMPLANT
NEEDLE HYPO 25X1 1.5 SAFETY (NEEDLE) ×2 IMPLANT
NS IRRIG 500ML POUR BTL (IV SOLUTION) ×2 IMPLANT
PACK PERINEAL COLD (PAD) ×2 IMPLANT
PACK VAGINAL WOMENS (CUSTOM PROCEDURE TRAY) ×2 IMPLANT
SUT VIC AB 0 SH 27 (SUTURE) ×2 IMPLANT
SUT VIC AB 2-0 CT2 27 (SUTURE) IMPLANT
SUT VIC AB 2-0 SH 27 (SUTURE)
SUT VIC AB 2-0 SH 27XBRD (SUTURE) IMPLANT
SUT VIC AB 3-0 SH 27 (SUTURE) ×6
SUT VIC AB 3-0 SH 27X BRD (SUTURE) ×1 IMPLANT
SUT VICRYL 4-0 PS2 18IN ABS (SUTURE) ×5 IMPLANT
SWAB OB GYN 8IN STERILE 2PK (MISCELLANEOUS) IMPLANT
SYR BULB IRRIGATION 50ML (SYRINGE) ×2 IMPLANT
TOWEL OR 17X26 10 PK STRL BLUE (TOWEL DISPOSABLE) ×4 IMPLANT
WATER STERILE IRR 500ML POUR (IV SOLUTION) IMPLANT

## 2019-08-15 NOTE — Transfer of Care (Signed)
Immediate Anesthesia Transfer of Care Note  Patient: Briana Wiley  Procedure(s) Performed: WIDE EXCISION VULVECTOMY (N/A Vulva)  Patient Location: PACU  Anesthesia Type:General  Level of Consciousness: awake, alert , oriented and patient cooperative  Airway & Oxygen Therapy: Patient Spontanous Breathing and Patient connected to face mask oxygen  Post-op Assessment: Report given to RN and Post -op Vital signs reviewed and stable  Post vital signs: Reviewed and stable  Last Vitals:  Vitals Value Taken Time  BP    Temp    Pulse 88 08/15/19 1204  Resp 19 08/15/19 1204  SpO2 100 % 08/15/19 1204  Vitals shown include unvalidated device data.  Last Pain:  Vitals:   08/15/19 0813  TempSrc: Oral  PainSc: 0-No pain      Patients Stated Pain Goal: 3 ([id number])  Complications: No apparent anesthesia complications

## 2019-08-15 NOTE — Op Note (Signed)
OPERATIVE NOTE PATIENT: Briana Wiley DATE: 08/15/19  Preop Diagnosis: VIN 3, history of vulvar cancer and radiation  Postoperative Diagnosis: same  Surgery: Partial simple left vulvectomy  Surgeons:  Donaciano Eva, MD Assistant: Joylene John  Anesthesia: General   Estimated blood loss: <30ml  IVF:  267ml   Urine output: 50 ml   Complications: None   Pathology: left labia minora with marking stitch at 12 o'clock anterior  Operative findings: verrucous lesion on left mid labia minora with acetowhite changes. The lesion was >2cm from the midline. Measured 3cm.   Procedure: The patient was identified in the preoperative holding area. Informed consent was signed on the chart. Patient was seen history was reviewed and exam was performed.   The patient was then taken to the operating room and placed in the supine position with SCD hose on. General anesthesia was then induced without difficulty. She was then placed in the dorsolithotomy position. The perineum was prepped with Betadine. The vagina was prepped with Betadine. The patient was then draped after the prep was dried. A Foley catheter was inserted into the bladder under sterile conditions.  Timeout was performed the patient, procedure, antibiotic, allergy, and length of procedure. 5% acetic acid solution was applied to the perineum. The vulvar tissues were inspected for areas of acetowhite changes or leukoplakia. The lesion was identified and the marking pen was used to circumscribe the area with appropriate surgical margins. The subcuticular tissues were infiltrated with 1% lidocaine. The 15 blade scalpel was used to make an incision through the skin circumferentially as marked. The skin elipse was grasped and was separated from the underlying deep dermal tissues with the bovie device. After the specimen had been completely resected, it was oriented and marked at 12 o'clock with a 0-vicryl suture. The bovie was  used to obtain hemostasis at the surgical bed. The subcutaneous tissues were irrigated and made hemostatic.   The deep dermal layer was approximated with 3-0vicryl mattress sutures to bring the skin edges into approximation and off tension. The wound was closed following langher's lines. The cutaneous layer was closed with interrupted 4-0 vicryl stitches and mattress sutures to ensure a tension free and hemostatic closure. The perineum was again irrigated. The foley was removed.  All instrument, suture, laparotomy, Ray-Tec, and needle counts were correct x2. The patient tolerated the procedure well and was taken recovery room in stable condition. This is [name] dictating an operative note on [name].  Thereasa Solo, MD

## 2019-08-15 NOTE — Anesthesia Preprocedure Evaluation (Addendum)
Anesthesia Evaluation  Patient identified by MRN, date of birth, ID band Patient awake    Reviewed: Allergy & Precautions, NPO status , Patient's Chart, lab work & pertinent test results  History of Anesthesia Complications Negative for: history of anesthetic complications  Airway Mallampati: II  TM Distance: >3 FB Neck ROM: Full    Dental  (+) Dental Advisory Given, Poor Dentition, Chipped   Pulmonary Current SmokerPatient did not abstain from smoking.,    Pulmonary exam normal        Cardiovascular negative cardio ROS Normal cardiovascular exam     Neuro/Psych PSYCHIATRIC DISORDERS Depression Schizophrenia negative neurological ROS     GI/Hepatic negative GI ROS, Neg liver ROS,   Endo/Other  negative endocrine ROS  Renal/GU negative Renal ROS  Female GU complaint     Musculoskeletal negative musculoskeletal ROS (+)   Abdominal   Peds  Hematology negative hematology ROS (+)   Anesthesia Other Findings Covid neg 4/22   Reproductive/Obstetrics  VIN                            Anesthesia Physical Anesthesia Plan  ASA: II  Anesthesia Plan: General   Post-op Pain Management:    Induction: Intravenous  PONV Risk Score and Plan: 3 and Treatment may vary due to age or medical condition, Ondansetron and Midazolam  Airway Management Planned: LMA  Additional Equipment: None  Intra-op Plan:   Post-operative Plan: Extubation in OR  Informed Consent: I have reviewed the patients History and Physical, chart, labs and discussed the procedure including the risks, benefits and alternatives for the proposed anesthesia with the patient or authorized representative who has indicated his/her understanding and acceptance.     Dental advisory given  Plan Discussed with: CRNA and Anesthesiologist  Anesthesia Plan Comments:        Anesthesia Quick Evaluation

## 2019-08-15 NOTE — Anesthesia Postprocedure Evaluation (Signed)
Anesthesia Post Note  Patient: Briana Wiley  Procedure(s) Performed: WIDE EXCISION VULVECTOMY (N/A Vulva)     Patient location during evaluation: PACU Anesthesia Type: General Level of consciousness: awake and alert Pain management: pain level controlled Vital Signs Assessment: post-procedure vital signs reviewed and stable Respiratory status: spontaneous breathing, nonlabored ventilation and respiratory function stable Cardiovascular status: blood pressure returned to baseline and stable Postop Assessment: no apparent nausea or vomiting Anesthetic complications: no    Last Vitals:  Vitals:   08/15/19 1215 08/15/19 1314  BP: (!) 147/91 (!) 154/84  Pulse: 84 71  Resp: 18 16  Temp:  36.7 C  SpO2: 97% 99%    Last Pain:  Vitals:   08/15/19 1314  TempSrc: Oral  PainSc: 0-No pain                 Audry Pili

## 2019-08-15 NOTE — Anesthesia Procedure Notes (Signed)
Procedure Name: LMA Insertion Date/Time: 08/15/2019 11:10 AM Performed by: Garrel Ridgel, CRNA Pre-anesthesia Checklist: Patient identified, Emergency Drugs available, Suction available, Patient being monitored and Timeout performed Patient Re-evaluated:Patient Re-evaluated prior to induction Oxygen Delivery Method: Circle system utilized Preoxygenation: Pre-oxygenation with 100% oxygen Induction Type: IV induction Ventilation: Mask ventilation without difficulty LMA Size: 3.0 Number of attempts: 1 Placement Confirmation: positive ETCO2 Tube secured with: Tape Dental Injury: Teeth and Oropharynx as per pre-operative assessment

## 2019-08-15 NOTE — Discharge Instructions (Addendum)
Vulvectomy, Care After  The vulva is the external female genitalia, outside and around the vagina and pubic bone. It consists of:  The skin on, and in front of, the pubic bone.  The clitoris.  The labia majora (large lips) on the outside of the vagina.  The labia minora (small lips) around the opening of the vagina.  The opening and the skin in and around the vagina. A vulvectomy is the removal of the tissue of the vulva, which sometimes includes removal of the lymph nodes and tissue in the groin areas. These discharge instructions provide you with general information on caring for yourself after you leave the hospital. It is also important that you know the warning signs of complications, so that you can seek treatment. Please read the instructions outlined below and refer to this sheet in the next few weeks. Your caregiver may also give you specific information and medicines. If you have any questions or complications after discharge, please call your caregiver. ACTIVITY  Rest as much as possible the first two weeks after discharge.  Arrange to have help from family or others with your daily activities when you go home.  Avoid heavy lifting (more than 5 pounds), pushing, or pulling.  If you feel tired, balance your activity with rest periods.  Follow your caregiver's instruction about climbing stairs and driving a car.  Increase activity gradually.  Do not exercise until you have permission from your caregiver. NUTRITION  You may resume your normal diet.  Drink 6 to 8 glasses of fluids a day.  Eat a healthy, balanced diet including portions of food from the meat (protein), milk, fruit, vegetable, and bread groups.  Your caregiver may recommend you take a multivitamin with iron. ELIMINATION  You may notice that your stream of urine is at a different angle, and may tend to spray. Using a plastic funnel may help to decrease urine spray.  If constipation occurs, drink more  liquids, and add more fruits, vegetables, and bran to your diet. You may take a mild laxative, such as Milk of Magnesia, Metamucil, or a stool softener such as Colace, with permission from your caregiver. HYGIENE  You may shower and wash your hair.  Check with your caregiver about tub baths.  Do not add any bath oils or chemicals to your bath water, after you have permission to take baths.  While passing urine, pour water from a bottle or spray over your vulva to dilute the urine as it passes the incision (this will decrease burning and discomfort).  Clean yourself well after moving your bowels.  After urinating, do not wipe. Dap or pat dry with toilet paper or a dry cleath soft cloth.  A sitz bath will help keep your perineal area clean, reduce swelling, and provide comfort.  Avoid wearing underpants for the first 2 weeks and wear loose skirts to allow circulation of air around the incision  You do not need to apply dressings, salves or lotions to the wound.  The stitches are self-dissolving and will absorb and disappear over a couple of months (it is normal to notice the knot from the stitches on toilet paper after voiding). HOME CARE INSTRUCTIONS   Apply a soft ice pack (or frozen bag of peas) to your perineum (vulva) every hour in the first 48 hours after surgery. This will reduce swelling.  Avoid activities that involve a lot of friction between your legs.  Avoid wearing pants or underpants in the 1st 2 weeks (skirts  are preferable).  Take your temperature twice a day and record it, especially if you feel feverish or have chills.  Follow your caregiver's instructions about medicines, activity, and follow-up appointments after surgery.  Do not drink alcohol while taking pain medicine.  Change your dressing as advised by your caregiver.  You may take over-the-counter medicine for pain, recommended by your caregiver.  If your pain is not relieved with medicine, call your  caregiver.  Do not take aspirin because it can cause bleeding.  Do not douche or use tampons (use a nonperfumed sanitary pad).  Do not have sexual intercourse until your caregiver gives you permission (typically 6 weeks postoperatively). Hugging, kissing, and playful sexual activity is fine with your caregiver's permission.  Warm sitz baths, with your caregiver's permission, are helpful to control swelling and discomfort.  Take showers instead of baths, until your caregiver gives you permission to take baths.  You may take a mild medicine for constipation, recommended by your caregiver. Bran foods and drinking a lot of fluids will help with constipation.  Make sure your family understands everything about your operation and recovery. SEEK MEDICAL CARE IF:   You notice swelling and redness around the wound area.  You notice a foul smell coming from the wound or on the surgical dressing.  You notice the wound is separating.  You have painful or bloody urination.  You develop nausea and vomiting.  You develop diarrhea.  You develop a rash.  You have a reaction or allergy from the medicine.  You feel dizzy or light-headed.  You need stronger pain medicine. SEEK IMMEDIATE MEDICAL CARE IF:   You develop a temperature of 102 F (38.9 C) or higher.  You pass out.  You develop leg or chest pain.  You develop abdominal pain.  You develop shortness of breath.  You develop bleeding from the wound area.  You see pus in the wound area. MAKE SURE YOU:   Understand these instructions.  Will watch your condition.  Will get help right away if you are not doing well or get worse. Document Released: 11/24/2003 Document Revised: 08/26/2013 Document Reviewed: 03/13/2009 [facility] Patient Information 2015 [place], [place]. This information is not intended to replace advice given to you by your health care provider. Make sure you discuss any questions you have with your health care  provider.  Information for Discharge Teaching: EXPAREL (bupivacaine liposome injectable suspension)   Your surgeon gave you EXPAREL(bupivacaine) in your surgical incision to help control your pain after surgery.   EXPAREL is a local anesthetic that provides pain relief by numbing the tissue around the surgical site.  EXPAREL is designed to release pain medication over time and can control pain for up to 72 hours.  Depending on how you respond to EXPAREL, you may require less pain medication during your recovery.  Possible side effects:  Temporary loss of sensation or ability to move in the area where bupivacaine was injected.  Nausea, vomiting, constipation  Rarely, numbness and tingling in your mouth or lips, lightheadedness, or anxiety may occur.  Call your doctor right away if you think you may be experiencing any of these sensations, or if you have other questions regarding possible side effects.  Follow all other discharge instructions given to you by your surgeon or nurse. Eat a healthy diet and drink plenty of water or other fluids.  If you return to the hospital for any reason within 96 hours following the administration of EXPAREL, please inform your health  care providers. Post Anesthesia Home Care Instructions  Activity: Get plenty of rest for the remainder of the day. A responsible adult should stay with you for 24 hours following the procedure.  For the next 24 hours, DO NOT: -Drive a car -Paediatric nurse -Drink alcoholic beverages -Take any medication unless instructed by your physician -Make any legal decisions or sign important papers.  Meals: Start with liquid foods such as gelatin or soup. Progress to regular foods as tolerated. Avoid greasy, spicy, heavy foods. If nausea and/or vomiting occur, drink only clear liquids until the nausea and/or vomiting subsides. Call your physician if vomiting continues.  Special Instructions/Symptoms: Your throat may feel  dry or sore from the anesthesia or the breathing tube placed in your throat during surgery. If this causes discomfort, gargle with warm salt water. The discomfort should disappear within 24 hours.  If you had a scopolamine patch placed behind your ear for the management of post- operative nausea and/or vomiting:  1. The medication in the patch is effective for 72 hours, after which it should be removed.  Wrap patch in a tissue and discard in the trash. Wash hands thoroughly with soap and water. 2. You may remove the patch earlier than 72 hours if you experience unpleasant side effects which may include dry mouth, dizziness or visual disturbances. 3. Avoid touching the patch. Wash your hands with soap and water after contact with the patch.

## 2019-08-15 NOTE — H&P (Addendum)
H&P Note: Gyn-Onc  Consult was initially requested by Dr. Elonda Husky for the evaluation of Briana Wiley 49 y.o. 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   Chief Complaint: VIN 3, history of vulvar cancer ,vulvar mass.    Assessment/Plan:  Briana Wiley  is a 49 y.o.  year old who has chronic paranoid schizophrenia, tobacco abuse and recurrent vulvar SCC s/p primary radiation (primary therapy completed February 13, 2014).  s/p right radical vulvectomy.  Recurrence on left vulva in May, 2019 S/p simple partial left vulvectomy in May, 2019.  VIN 3 confirmed in March (failed to present for her scheduled wide local excision procedure at that time).  HPI: Briana Wiley is a 49 year old woman who was seen in consultation at the request of Dr. Elonda Husky for clinical stage II vulvar squamous cell carcinoma. 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 She also has a history significant for paranoid schizophrenia and resides in her care facility for this. She is an extremely heavy smoker and continues to smoke 1-2 packs per day. She began experiencing vulvar irritation in the past few months. She was seen by Dr. Elonda Husky for this. He recognized the posterior 5 cm raised erythematous vulva mass and performed a biopsy. Pathology revealed at least high-grade VIN associated with an area highly suspicious for squamous cell carcinoma.  PET scan was performed which revealed positive inguinal and pelvic nodes.   She was treated with primary radiation with external beam radiation (45 gray to the pelvis and 55 gray with simulated integrated boost to the pelvic nodes). The patient then received a boost to the vulva area to a cumulative dose of 59.4 gray. Treatment dates were 12/26/13-02/13/14. She tolerated therapy well with no issues or delays.  She developed intermittent mild vulvar pain. Biopsy of vulva on 06/24/15 showed VIN III and she underwent  CO2 laser with Dr. Denman George in April 2017. That was last time that she was seen by our service. She was seen by radiation oncology in December was noted to have a 1.5 cm ulcerative lesion on the labia and was subsequently sent in to see [place] today. She states that she believes that this lesion is a portal and a while was hurting and having some bleeding earlier is no longer hurting and she is no longer having any bleeding.  Biopsy on 04/27/16 with Dr Alycia Rossetti showed at least [place].   She failed to show for appointments after this biopsy. She reported increasing vulvar pain.  On 06/23/16 she was able to be scheduled for a radical right vulvectomy. Pathology confirmed invasive SCC associated with VIN III. VIN III extended to the medial and inferior margin, however the margins were negative for SCC.  She reported pruritis on left vulva in June, 2018. This was biopsied and showed VIN3. She was recommended surgery but declined because she was "tired of being cut on".  She was treated with effudex instead which she reports she has been using as directed between February, 2019 until May, 2019. The lesion remained pruritic and raised and did not respond. On 09/14/17 she underwent partial simple left vulvectomy and resection of a right periclitoral lesion. The left vulvectomy revealed a microinvasive squamous cell carcinoma (<57mm invasion) with negative margins for invasive cancer (positive for VIN III), and the periclitoral lesion showed VIN III (positive margins).   Interval Hx: Postop she appeared once for follow-up then was lost to follow-up. She  saw Dr Elonda Husky locally for vulvar irritation in February, 2021 and a left sided lesion was noted which he thought looked like vulvar cancer.Biopsy was not performed at that time.  She saw me in March, 2021 and a 3cm area of leukoplakia and induration on left posterior labia majora was seen and biopsied. This was confirmed to be VIN3.   She was scheduled for a wide local  excision that same month but failed to present for surgery at the scheduled date.  Surgery was rescheduled for 08/15/19.    Current Meds:  Outpatient Encounter Medications as of 07/09/2019  Medication Sig  . albuterol (PROVENTIL HFA;VENTOLIN HFA) 108 (90 BASE) MCG/ACT inhaler Inhale 2 puffs into the lungs every 4 (four) hours as needed for wheezing or shortness of breath.  . benztropine (COGENTIN) 1 MG tablet Take 1 mg by mouth 2 (two) times daily.  . haloperidol (HALDOL) 5 MG tablet Take 5 mg by mouth at bedtime.  . haloperidol decanoate (HALDOL DECANOATE) 100 MG/ML injection Inject into the muscle every 21 ( twenty-one) days.   Marland Kitchen ibuprofen (ADVIL,MOTRIN) 400 MG tablet Take 400 mg by mouth every 6 (six) hours as needed.  . metroNIDAZOLE (FLAGYL) 500 MG tablet Take 1 tablet (500 mg total) by mouth 2 (two) times daily.  . solifenacin (VESICARE) 10 MG tablet Take 10 mg by mouth every morning.   . traZODone (DESYREL) 50 MG tablet Take 50 mg by mouth at bedtime. Takes 3 tabs at bedtime   No facility-administered encounter medications on file as of 07/09/2019.    Allergy:  Allergies  Allergen Reactions  . Chicken Meat (Diagnostic) Nausea Only    Social Hx:   Social History   Socioeconomic History  . Marital status: Single    Spouse name: Not on file  . Number of children: 3  . Years of education: Not on file  . Highest education level: Not on file  Occupational History    Employer: UNEMPLOYED  Tobacco Use  . Smoking status: Current Every Day Smoker    Packs/day: 1.50    Years: 30.00    Pack years: 45.00    Types: Cigarettes  . Smokeless tobacco: Never Used  Substance and Sexual Activity  . Alcohol use: No  . Drug use: No  . Sexual activity: Not Currently    Birth control/protection: Surgical    Comment: tubal and ablation  Other Topics Concern  . Not on file  Social History Narrative  . Not on file   Social Determinants of Health   Financial Resource Strain:   .  Difficulty of Paying Living Expenses:   Food Insecurity:   . Worried About Charity fundraiser in the Last Year:   . Arboriculturist in the Last Year:   Transportation Needs:   . Film/video editor (Medical):   Marland Kitchen Lack of Transportation (Non-Medical):   Physical Activity:   . Days of Exercise per Week:   . Minutes of Exercise per Session:   Stress:   . Feeling of Stress :   Social Connections:   . Frequency of Communication with Friends and Family:   . Frequency of Social Gatherings with Friends and Family:   . Attends Religious Services:   . Active Member of Clubs or Organizations:   . Attends Archivist Meetings:   Marland Kitchen Marital Status:   Intimate Partner Violence:   . Fear of Current or Ex-Partner:   . Emotionally Abused:   Marland Kitchen Physically Abused:   .  Sexually Abused:     Past Surgical Hx:  Past Surgical History:  Procedure Laterality Date  . CO2 LASER APPLICATION N/A [id number]   Procedure: CO2 LASER OF THE VULVA;  Surgeon: [name], MD;  [facility];  Service: Gynecology;  Laterality: N/A;  . COLD KNIFE CONIZATION OF CERVIX AND MULTPLE VULVA BX'S  05/14/2001  . D & C HYSTEROSCOPY W/ ENDOMETRIAL ABLATION   02/06/2008  . RADICAL VULVECTOMY Right 06/23/2016   Procedure: RADICAL VULVECTOMY;  Surgeon: [name], MD;  Location: [facility];  Service: Gynecology;  Laterality: Right;  . TUBAL LIGATION Bilateral yrs ago  . VULVA / PERINEUM BIOPSY  12/02/13  . VULVECTOMY N/A 09/14/2017   Procedure: WIDE LOCAL EXCISION OF THE VULVA;  Surgeon: [name], MD;  Location: [facility];  Service: Gynecology;  Laterality: N/A;  . VULVECTOMY PARTIAL Bilateral 07/15/2002    Past Medical Hx:  Past Medical History:  Diagnosis Date  . Chronic paranoid schizophrenia ([place])   . Depression   . History of bronchitis   . History of cancer of vulva    oncologist-  dr Denman George--- s/p  bilateral partial vulvectomy 07-15-2002;  completed external beam  radiation 02-13-2014;  s/p  laser vulva dysplasia 07-28-2015;   s/p  radical vulvectomy 03/ 2018  . History of radiation therapy 12-26-2013  to 02-13-2014   45gray ro pelvis, pelvic nodes boosted to 55gray, vulvar area boosted to 59.4gray  . Papanicolaou smear of cervix with positive high risk human papilloma virus (HPV) test 06/06/2019   Repeat pap in 1 year  . Trichimoniasis 06/06/2019   Treated 06/06/19 with flagyl  . Urinary incontinence   . Vaginal Pap smear, abnormal   . VIN III (vulvar intraepithelial neoplasia III)    recurrent  . Wears partial dentures     Past Gynecological History:  G3P3, SVD x3  No LMP recorded. (Menstrual status: Other).  Family Hx:  Family History  Problem Relation Age of Onset  . Seizures Mother   . Hypertension Sister    Vitals:  Blood pressure 120/72, pulse 77, temperature (!) 97.1 F (36.2 C), temperature source Oral, resp. rate 18, height 5\' 1"  (1.549 m), weight 144 lb 9.6 oz (65.6 kg), SpO2 100 %.  Physical Exam: WD in NAD  Groins: Negative for lymphadenopathy.  Genito Urinary  Vulva/vagina: 3cm area of leukoplakia and induration on left posterior labia majora. Looks most consistent with VIN 3. Biopsy performed.    Thereasa Solo, MD   08/15/2019, 8:56 AM

## 2019-08-16 ENCOUNTER — Telehealth: Payer: Self-pay

## 2019-08-16 ENCOUNTER — Other Ambulatory Visit: Payer: Self-pay

## 2019-08-16 ENCOUNTER — Ambulatory Visit (HOSPITAL_COMMUNITY)
Admission: RE | Admit: 2019-08-16 | Discharge: 2019-08-16 | Disposition: A | Discharge status: Home / Self Care | Payer: Medicare Other | Source: Ambulatory Visit | Attending: Internal Medicine | Admitting: Internal Medicine

## 2019-08-16 DIAGNOSIS — F209 Schizophrenia, unspecified: Secondary | ICD-10-CM | POA: Diagnosis not present

## 2019-08-16 DIAGNOSIS — J410 Simple chronic bronchitis: Secondary | ICD-10-CM | POA: Diagnosis not present

## 2019-08-16 DIAGNOSIS — Z1231 Encounter for screening mammogram for malignant neoplasm of breast: Secondary | ICD-10-CM | POA: Insufficient documentation

## 2019-08-16 NOTE — Telephone Encounter (Signed)
Spoke with Ms Briana Cote, Ms Briana Wiley's guardian. She said that Ms Briana Wiley is eating, drinking, and urinating well. She has moved her bowels. Afebrile. She is up and about  wanting to helping  in the office a little. Ms Briana Wiley is aware of follow up appointment on 09-04-19 for post op visit and the office number [phone] is she has any questions or concerns prior to the post op visit.

## 2019-08-19 LAB — SURGICAL PATHOLOGY

## 2019-08-20 ENCOUNTER — Telehealth: Payer: Self-pay | Admitting: *Deleted

## 2019-08-20 NOTE — Telephone Encounter (Signed)
Spoke with Ms. Huel Cote (pt's guardian). I let her know that no cancer was seen. She states that the patient is feeling "great" she is up and around, doing her normal activities.

## 2019-08-22 ENCOUNTER — Other Ambulatory Visit (HOSPITAL_COMMUNITY): Payer: Self-pay | Admitting: Internal Medicine

## 2019-08-22 DIAGNOSIS — R928 Other abnormal and inconclusive findings on diagnostic imaging of breast: Secondary | ICD-10-CM

## 2019-08-29 DIAGNOSIS — F209 Schizophrenia, unspecified: Secondary | ICD-10-CM | POA: Diagnosis not present

## 2019-09-04 ENCOUNTER — Inpatient Hospital Stay: Payer: Medicare Other | Admitting: Gynecologic Oncology

## 2019-09-04 DIAGNOSIS — C519 Malignant neoplasm of vulva, unspecified: Secondary | ICD-10-CM

## 2019-09-05 ENCOUNTER — Telehealth: Payer: Self-pay | Admitting: *Deleted

## 2019-09-05 NOTE — Telephone Encounter (Signed)
Called and rescheduled her appt for 5/12 to 5/25

## 2019-09-10 ENCOUNTER — Ambulatory Visit (HOSPITAL_COMMUNITY): Payer: Medicare Other

## 2019-09-10 ENCOUNTER — Encounter (HOSPITAL_COMMUNITY): Payer: Self-pay

## 2019-09-10 ENCOUNTER — Encounter (HOSPITAL_COMMUNITY): Payer: Medicaid Other

## 2019-09-15 DIAGNOSIS — J410 Simple chronic bronchitis: Secondary | ICD-10-CM | POA: Diagnosis not present

## 2019-09-15 DIAGNOSIS — F209 Schizophrenia, unspecified: Secondary | ICD-10-CM | POA: Diagnosis not present

## 2019-09-17 ENCOUNTER — Inpatient Hospital Stay: Payer: Medicare Other | Admitting: Gynecologic Oncology

## 2019-09-17 DIAGNOSIS — F209 Schizophrenia, unspecified: Secondary | ICD-10-CM | POA: Diagnosis not present

## 2019-09-17 DIAGNOSIS — F17200 Nicotine dependence, unspecified, uncomplicated: Secondary | ICD-10-CM | POA: Diagnosis not present

## 2019-09-17 DIAGNOSIS — J410 Simple chronic bronchitis: Secondary | ICD-10-CM | POA: Diagnosis not present

## 2019-09-17 DIAGNOSIS — F17210 Nicotine dependence, cigarettes, uncomplicated: Secondary | ICD-10-CM | POA: Diagnosis not present

## 2019-09-19 DIAGNOSIS — F209 Schizophrenia, unspecified: Secondary | ICD-10-CM | POA: Diagnosis not present

## 2019-09-20 ENCOUNTER — Telehealth: Payer: Self-pay | Admitting: *Deleted

## 2019-09-20 NOTE — Telephone Encounter (Signed)
Called and rescheduled her post op appt to 6/9

## 2019-09-24 ENCOUNTER — Other Ambulatory Visit: Payer: Self-pay

## 2019-09-24 ENCOUNTER — Ambulatory Visit (HOSPITAL_COMMUNITY)
Admission: RE | Admit: 2019-09-24 | Discharge: 2019-09-24 | Disposition: A | Discharge status: Home / Self Care | Payer: Medicare Other | Source: Ambulatory Visit | Attending: Internal Medicine | Admitting: Internal Medicine

## 2019-09-24 DIAGNOSIS — R928 Other abnormal and inconclusive findings on diagnostic imaging of breast: Secondary | ICD-10-CM

## 2019-10-02 ENCOUNTER — Inpatient Hospital Stay: Payer: Medicare Other | Attending: Gynecologic Oncology | Admitting: Gynecologic Oncology

## 2019-10-31 DIAGNOSIS — F209 Schizophrenia, unspecified: Secondary | ICD-10-CM | POA: Diagnosis not present

## 2019-11-17 DIAGNOSIS — F209 Schizophrenia, unspecified: Secondary | ICD-10-CM | POA: Diagnosis not present

## 2019-11-17 DIAGNOSIS — J410 Simple chronic bronchitis: Secondary | ICD-10-CM | POA: Diagnosis not present

## 2019-11-21 DIAGNOSIS — F209 Schizophrenia, unspecified: Secondary | ICD-10-CM | POA: Diagnosis not present

## 2019-12-05 DIAGNOSIS — F209 Schizophrenia, unspecified: Secondary | ICD-10-CM | POA: Diagnosis not present

## 2019-12-12 DIAGNOSIS — F209 Schizophrenia, unspecified: Secondary | ICD-10-CM | POA: Diagnosis not present

## 2019-12-18 DIAGNOSIS — F209 Schizophrenia, unspecified: Secondary | ICD-10-CM | POA: Diagnosis not present

## 2019-12-18 DIAGNOSIS — J410 Simple chronic bronchitis: Secondary | ICD-10-CM | POA: Diagnosis not present

## 2020-01-02 DIAGNOSIS — F209 Schizophrenia, unspecified: Secondary | ICD-10-CM | POA: Diagnosis not present

## 2020-01-18 DIAGNOSIS — J410 Simple chronic bronchitis: Secondary | ICD-10-CM | POA: Diagnosis not present

## 2020-01-18 DIAGNOSIS — F209 Schizophrenia, unspecified: Secondary | ICD-10-CM | POA: Diagnosis not present

## 2020-01-30 DIAGNOSIS — F209 Schizophrenia, unspecified: Secondary | ICD-10-CM | POA: Diagnosis not present

## 2020-02-17 DIAGNOSIS — J410 Simple chronic bronchitis: Secondary | ICD-10-CM | POA: Diagnosis not present

## 2020-02-17 DIAGNOSIS — F209 Schizophrenia, unspecified: Secondary | ICD-10-CM | POA: Diagnosis not present

## 2020-02-20 DIAGNOSIS — F209 Schizophrenia, unspecified: Secondary | ICD-10-CM | POA: Diagnosis not present

## 2020-03-06 DIAGNOSIS — Z23 Encounter for immunization: Secondary | ICD-10-CM | POA: Diagnosis not present

## 2020-03-12 DIAGNOSIS — F209 Schizophrenia, unspecified: Secondary | ICD-10-CM | POA: Diagnosis not present

## 2020-03-19 DIAGNOSIS — F209 Schizophrenia, unspecified: Secondary | ICD-10-CM | POA: Diagnosis not present

## 2020-03-19 DIAGNOSIS — J410 Simple chronic bronchitis: Secondary | ICD-10-CM | POA: Diagnosis not present

## 2020-04-02 ENCOUNTER — Encounter: Payer: Self-pay | Admitting: *Deleted

## 2020-04-02 DIAGNOSIS — F209 Schizophrenia, unspecified: Secondary | ICD-10-CM | POA: Diagnosis not present

## 2020-04-02 DIAGNOSIS — F17200 Nicotine dependence, unspecified, uncomplicated: Secondary | ICD-10-CM | POA: Diagnosis not present

## 2020-04-02 DIAGNOSIS — F17210 Nicotine dependence, cigarettes, uncomplicated: Secondary | ICD-10-CM | POA: Diagnosis not present

## 2020-04-02 DIAGNOSIS — Z1331 Encounter for screening for depression: Secondary | ICD-10-CM | POA: Diagnosis not present

## 2020-04-02 DIAGNOSIS — Z1389 Encounter for screening for other disorder: Secondary | ICD-10-CM | POA: Diagnosis not present

## 2020-04-02 DIAGNOSIS — J410 Simple chronic bronchitis: Secondary | ICD-10-CM | POA: Diagnosis not present

## 2020-04-02 DIAGNOSIS — R32 Unspecified urinary incontinence: Secondary | ICD-10-CM | POA: Diagnosis not present

## 2020-04-07 DIAGNOSIS — F209 Schizophrenia, unspecified: Secondary | ICD-10-CM | POA: Diagnosis not present

## 2020-04-21 DIAGNOSIS — Z79899 Other long term (current) drug therapy: Secondary | ICD-10-CM | POA: Diagnosis not present

## 2020-04-21 DIAGNOSIS — Z0001 Encounter for general adult medical examination with abnormal findings: Secondary | ICD-10-CM | POA: Diagnosis not present

## 2020-04-23 DIAGNOSIS — F209 Schizophrenia, unspecified: Secondary | ICD-10-CM | POA: Diagnosis not present

## 2020-05-03 DIAGNOSIS — F209 Schizophrenia, unspecified: Secondary | ICD-10-CM | POA: Diagnosis not present

## 2020-05-03 DIAGNOSIS — J410 Simple chronic bronchitis: Secondary | ICD-10-CM | POA: Diagnosis not present

## 2020-05-08 ENCOUNTER — Encounter: Payer: Medicare Other | Admitting: Obstetrics & Gynecology

## 2020-05-14 DIAGNOSIS — F209 Schizophrenia, unspecified: Secondary | ICD-10-CM | POA: Diagnosis not present

## 2020-05-18 ENCOUNTER — Encounter: Payer: Self-pay | Admitting: Internal Medicine

## 2020-05-18 ENCOUNTER — Other Ambulatory Visit: Payer: Self-pay

## 2020-05-18 ENCOUNTER — Ambulatory Visit: Payer: Self-pay

## 2020-06-03 DIAGNOSIS — F209 Schizophrenia, unspecified: Secondary | ICD-10-CM | POA: Diagnosis not present

## 2020-06-03 DIAGNOSIS — F17200 Nicotine dependence, unspecified, uncomplicated: Secondary | ICD-10-CM | POA: Diagnosis not present

## 2020-06-04 DIAGNOSIS — F209 Schizophrenia, unspecified: Secondary | ICD-10-CM | POA: Diagnosis not present

## 2020-06-09 ENCOUNTER — Ambulatory Visit: Payer: Medicare Other | Admitting: Adult Health

## 2020-06-09 DIAGNOSIS — F209 Schizophrenia, unspecified: Secondary | ICD-10-CM | POA: Diagnosis not present

## 2020-06-25 DIAGNOSIS — F209 Schizophrenia, unspecified: Secondary | ICD-10-CM | POA: Diagnosis not present

## 2020-07-02 DIAGNOSIS — F17200 Nicotine dependence, unspecified, uncomplicated: Secondary | ICD-10-CM | POA: Diagnosis not present

## 2020-07-02 DIAGNOSIS — F209 Schizophrenia, unspecified: Secondary | ICD-10-CM | POA: Diagnosis not present

## 2020-07-07 ENCOUNTER — Other Ambulatory Visit (HOSPITAL_COMMUNITY)
Admission: RE | Admit: 2020-07-07 | Discharge: 2020-07-07 | Disposition: A | Discharge status: Home / Self Care | Payer: Medicare Other | Source: Ambulatory Visit | Attending: Adult Health | Admitting: Adult Health

## 2020-07-07 ENCOUNTER — Encounter: Payer: Self-pay | Admitting: Adult Health

## 2020-07-07 ENCOUNTER — Other Ambulatory Visit: Payer: Self-pay

## 2020-07-07 ENCOUNTER — Ambulatory Visit (INDEPENDENT_AMBULATORY_CARE_PROVIDER_SITE_OTHER): Payer: Medicare Other | Admitting: Adult Health

## 2020-07-07 VITALS — BP 123/78 | HR 81 | Ht 62.5 in | Wt 147.5 lb

## 2020-07-07 DIAGNOSIS — Z1211 Encounter for screening for malignant neoplasm of colon: Secondary | ICD-10-CM | POA: Insufficient documentation

## 2020-07-07 DIAGNOSIS — Z1151 Encounter for screening for human papillomavirus (HPV): Secondary | ICD-10-CM | POA: Insufficient documentation

## 2020-07-07 DIAGNOSIS — Z01419 Encounter for gynecological examination (general) (routine) without abnormal findings: Secondary | ICD-10-CM

## 2020-07-07 DIAGNOSIS — Z0000 Encounter for general adult medical examination without abnormal findings: Secondary | ICD-10-CM | POA: Diagnosis not present

## 2020-07-07 DIAGNOSIS — C519 Malignant neoplasm of vulva, unspecified: Secondary | ICD-10-CM | POA: Diagnosis not present

## 2020-07-07 DIAGNOSIS — R87810 Cervical high risk human papillomavirus (HPV) DNA test positive: Secondary | ICD-10-CM

## 2020-07-07 DIAGNOSIS — A630 Anogenital (venereal) warts: Secondary | ICD-10-CM | POA: Diagnosis not present

## 2020-07-07 DIAGNOSIS — Z124 Encounter for screening for malignant neoplasm of cervix: Secondary | ICD-10-CM | POA: Insufficient documentation

## 2020-07-07 LAB — HEMOCCULT GUIAC POC 1CARD (OFFICE): Fecal Occult Blood, POC: NEGATIVE

## 2020-07-07 NOTE — Progress Notes (Signed)
Patient ID: Briana Wiley, female   DOB: 10-Aug-1970, 50 y.o.   MRN: [id number] History of Present Illness:  Briana Wiley is a 50 year old white female,single, PM in for well woman gyn exam and pap. She had normal pap with +HPV 06/03/19. She has VIN III, and with recurrence has had vulvectomy and radiation.  She resides at [facility]. PCP is Dr Briana Wiley.  Current Medications, Allergies, Past Medical History, Past Surgical History, Family History and Social History were reviewed in [employer] record.     Review of Systems: Patient denies any headaches, hearing loss, fatigue, blurred vision, shortness of breath, chest pain, abdominal pain, problems with bowel movements, urination, or intercourse.(not having sex) No joint pain or mood swings.    Physical Exam:BP 123/78 (BP Location: Left Arm, Patient Position: Sitting, Cuff Size: Normal)   Pulse 81   Ht 5' 2.5" (1.588 m)   Wt 147 lb 8 oz (66.9 kg)   LMP 12/30/2013   BMI 26.55 kg/m  General:  Well developed, well nourished, no acute distress Skin:  Warm and dry Neck:  Midline trachea, normal thyroid, good ROM, no lymphadenopathy Lungs; Clear to auscultation bilaterally Breast:  No dominant palpable mass, retraction, or nipple discharge Cardiovascular: Regular rate and rhythm Abdomen:  Soft, non tender, no hepatosplenomegaly Pelvic:  External genitalia is sp vulvectomy, has irregular skin changes.  The vagina is pale with loss of moisture and rugae, she has wart on clitoris. Urethra has no lesions or masses. The cervix is smooth,pap with GC/CHL and HR HPV genotyping performed.  Uterus is felt to be normal size, shape, and contour.  No adnexal masses or tenderness noted.Bladder is non tender, no masses felt. Rectal: Good sphincter tone, no polyps, or hemorrhoids felt.  Hemoccult negative. Extremities/musculoskeletal:  No swelling or varicosities noted, no clubbing or cyanosis Psych:  No mood changes,  alert and cooperative,seems happy Fall risk is low PHQ 2 score is 0  Upstream - 07/07/20 1430      Pregnancy Intention Screening   Does the patient want to become pregnant in the next year? N/A    Does the patient's partner want to become pregnant in the next year? N/A    Would the patient like to discuss contraceptive options today? N/A      Contraception Wrap Up   Current Method Female Sterilization    End Method Female Sterilization    Contraception Counseling Provided No          Co exam with Briana Gens NP student performed   Impression and plan:  1. Routine medical exam  2. Routine cervical smear  3. Encounter for routine gynecological examination with Papanicolaou smear of cervix Pap sent Physical in 1 year Pap in 3 if normal Mammogram yearly Colonoscopy per GI  Labs with PCP  4. Encounter for screening fecal occult blood testing   5. Papanicolaou smear of cervix with positive high risk human papilloma virus (HPV) test Pap sent   6. Vulva cancer ([place])   7. Genital warts Briana Wiley declines meds or excision at this time

## 2020-07-10 LAB — CYTOLOGY - PAP
Adequacy: ABSENT
Chlamydia: NEGATIVE
Comment: NEGATIVE
Comment: NEGATIVE
Comment: NORMAL
Diagnosis: NEGATIVE
High risk HPV: NEGATIVE
Neisseria Gonorrhea: NEGATIVE

## 2020-07-16 DIAGNOSIS — F209 Schizophrenia, unspecified: Secondary | ICD-10-CM | POA: Diagnosis not present

## 2020-08-02 DIAGNOSIS — F209 Schizophrenia, unspecified: Secondary | ICD-10-CM | POA: Diagnosis not present

## 2020-08-02 DIAGNOSIS — F17210 Nicotine dependence, cigarettes, uncomplicated: Secondary | ICD-10-CM | POA: Diagnosis not present

## 2020-08-06 DIAGNOSIS — F209 Schizophrenia, unspecified: Secondary | ICD-10-CM | POA: Diagnosis not present

## 2020-08-27 DIAGNOSIS — F209 Schizophrenia, unspecified: Secondary | ICD-10-CM | POA: Diagnosis not present

## 2020-09-01 DIAGNOSIS — F209 Schizophrenia, unspecified: Secondary | ICD-10-CM | POA: Diagnosis not present

## 2020-09-01 DIAGNOSIS — F17200 Nicotine dependence, unspecified, uncomplicated: Secondary | ICD-10-CM | POA: Diagnosis not present

## 2020-09-17 DIAGNOSIS — F209 Schizophrenia, unspecified: Secondary | ICD-10-CM | POA: Diagnosis not present

## 2020-09-22 DIAGNOSIS — Z23 Encounter for immunization: Secondary | ICD-10-CM | POA: Diagnosis not present

## 2020-09-30 ENCOUNTER — Other Ambulatory Visit (HOSPITAL_COMMUNITY): Payer: Self-pay | Admitting: Internal Medicine

## 2020-09-30 DIAGNOSIS — Z1231 Encounter for screening mammogram for malignant neoplasm of breast: Secondary | ICD-10-CM

## 2020-10-01 DIAGNOSIS — F209 Schizophrenia, unspecified: Secondary | ICD-10-CM | POA: Diagnosis not present

## 2020-10-01 DIAGNOSIS — F17200 Nicotine dependence, unspecified, uncomplicated: Secondary | ICD-10-CM | POA: Diagnosis not present

## 2020-10-01 DIAGNOSIS — J410 Simple chronic bronchitis: Secondary | ICD-10-CM | POA: Diagnosis not present

## 2020-10-08 DIAGNOSIS — F209 Schizophrenia, unspecified: Secondary | ICD-10-CM | POA: Diagnosis not present

## 2020-10-14 ENCOUNTER — Ambulatory Visit (HOSPITAL_COMMUNITY): Payer: Medicare Other

## 2020-10-29 DIAGNOSIS — F209 Schizophrenia, unspecified: Secondary | ICD-10-CM | POA: Diagnosis not present

## 2020-10-31 DIAGNOSIS — D4959 Neoplasm of unspecified behavior of other genitourinary organ: Secondary | ICD-10-CM | POA: Diagnosis not present

## 2020-10-31 DIAGNOSIS — F209 Schizophrenia, unspecified: Secondary | ICD-10-CM | POA: Diagnosis not present

## 2020-11-19 DIAGNOSIS — F209 Schizophrenia, unspecified: Secondary | ICD-10-CM | POA: Diagnosis not present

## 2020-11-27 DIAGNOSIS — F209 Schizophrenia, unspecified: Secondary | ICD-10-CM | POA: Diagnosis not present

## 2020-12-01 DIAGNOSIS — F209 Schizophrenia, unspecified: Secondary | ICD-10-CM | POA: Diagnosis not present

## 2020-12-01 DIAGNOSIS — F17200 Nicotine dependence, unspecified, uncomplicated: Secondary | ICD-10-CM | POA: Diagnosis not present

## 2020-12-10 DIAGNOSIS — F209 Schizophrenia, unspecified: Secondary | ICD-10-CM | POA: Diagnosis not present

## 2020-12-31 DIAGNOSIS — F209 Schizophrenia, unspecified: Secondary | ICD-10-CM | POA: Diagnosis not present

## 2021-01-01 DIAGNOSIS — F17200 Nicotine dependence, unspecified, uncomplicated: Secondary | ICD-10-CM | POA: Diagnosis not present

## 2021-01-01 DIAGNOSIS — F209 Schizophrenia, unspecified: Secondary | ICD-10-CM | POA: Diagnosis not present

## 2021-01-18 DIAGNOSIS — Z23 Encounter for immunization: Secondary | ICD-10-CM | POA: Diagnosis not present

## 2021-01-21 DIAGNOSIS — F209 Schizophrenia, unspecified: Secondary | ICD-10-CM | POA: Diagnosis not present

## 2021-01-31 DIAGNOSIS — F17200 Nicotine dependence, unspecified, uncomplicated: Secondary | ICD-10-CM | POA: Diagnosis not present

## 2021-01-31 DIAGNOSIS — F209 Schizophrenia, unspecified: Secondary | ICD-10-CM | POA: Diagnosis not present

## 2021-02-11 ENCOUNTER — Ambulatory Visit (HOSPITAL_COMMUNITY): Payer: Medicare Other

## 2021-02-11 DIAGNOSIS — F209 Schizophrenia, unspecified: Secondary | ICD-10-CM | POA: Diagnosis not present

## 2021-02-22 ENCOUNTER — Ambulatory Visit (HOSPITAL_COMMUNITY)
Admission: RE | Admit: 2021-02-22 | Discharge: 2021-02-22 | Disposition: A | Discharge status: Home / Self Care | Payer: Medicare Other | Source: Ambulatory Visit | Attending: Internal Medicine | Admitting: Internal Medicine

## 2021-02-22 ENCOUNTER — Other Ambulatory Visit: Payer: Self-pay

## 2021-02-22 DIAGNOSIS — Z1231 Encounter for screening mammogram for malignant neoplasm of breast: Secondary | ICD-10-CM | POA: Insufficient documentation

## 2021-03-03 DIAGNOSIS — F209 Schizophrenia, unspecified: Secondary | ICD-10-CM | POA: Diagnosis not present

## 2021-03-03 DIAGNOSIS — F17200 Nicotine dependence, unspecified, uncomplicated: Secondary | ICD-10-CM | POA: Diagnosis not present

## 2021-03-04 DIAGNOSIS — F209 Schizophrenia, unspecified: Secondary | ICD-10-CM | POA: Diagnosis not present

## 2021-03-25 DIAGNOSIS — F209 Schizophrenia, unspecified: Secondary | ICD-10-CM | POA: Diagnosis not present

## 2021-04-15 DIAGNOSIS — F209 Schizophrenia, unspecified: Secondary | ICD-10-CM | POA: Diagnosis not present

## 2021-04-22 DIAGNOSIS — Z859 Personal history of malignant neoplasm, unspecified: Secondary | ICD-10-CM | POA: Diagnosis not present

## 2021-04-22 DIAGNOSIS — Z1331 Encounter for screening for depression: Secondary | ICD-10-CM | POA: Diagnosis not present

## 2021-04-22 DIAGNOSIS — F17210 Nicotine dependence, cigarettes, uncomplicated: Secondary | ICD-10-CM | POA: Diagnosis not present

## 2021-04-22 DIAGNOSIS — F209 Schizophrenia, unspecified: Secondary | ICD-10-CM | POA: Diagnosis not present

## 2021-04-22 DIAGNOSIS — Z1389 Encounter for screening for other disorder: Secondary | ICD-10-CM | POA: Diagnosis not present

## 2021-04-22 DIAGNOSIS — F17200 Nicotine dependence, unspecified, uncomplicated: Secondary | ICD-10-CM | POA: Diagnosis not present

## 2021-04-22 DIAGNOSIS — J410 Simple chronic bronchitis: Secondary | ICD-10-CM | POA: Diagnosis not present

## 2021-05-04 DIAGNOSIS — Z79899 Other long term (current) drug therapy: Secondary | ICD-10-CM | POA: Diagnosis not present

## 2021-05-04 DIAGNOSIS — J410 Simple chronic bronchitis: Secondary | ICD-10-CM | POA: Diagnosis not present

## 2021-05-06 DIAGNOSIS — F209 Schizophrenia, unspecified: Secondary | ICD-10-CM | POA: Diagnosis not present

## 2021-05-13 DIAGNOSIS — F209 Schizophrenia, unspecified: Secondary | ICD-10-CM | POA: Diagnosis not present

## 2021-05-23 DIAGNOSIS — F209 Schizophrenia, unspecified: Secondary | ICD-10-CM | POA: Diagnosis not present

## 2021-05-23 DIAGNOSIS — F17210 Nicotine dependence, cigarettes, uncomplicated: Secondary | ICD-10-CM | POA: Diagnosis not present

## 2021-05-27 DIAGNOSIS — F209 Schizophrenia, unspecified: Secondary | ICD-10-CM | POA: Diagnosis not present

## 2021-06-08 DIAGNOSIS — Z23 Encounter for immunization: Secondary | ICD-10-CM | POA: Diagnosis not present

## 2021-06-17 DIAGNOSIS — F209 Schizophrenia, unspecified: Secondary | ICD-10-CM | POA: Diagnosis not present

## 2021-06-22 DIAGNOSIS — F17200 Nicotine dependence, unspecified, uncomplicated: Secondary | ICD-10-CM | POA: Diagnosis not present

## 2021-06-22 DIAGNOSIS — F209 Schizophrenia, unspecified: Secondary | ICD-10-CM | POA: Diagnosis not present

## 2021-07-08 DIAGNOSIS — F209 Schizophrenia, unspecified: Secondary | ICD-10-CM | POA: Diagnosis not present

## 2021-07-09 ENCOUNTER — Other Ambulatory Visit: Payer: Self-pay

## 2021-07-09 ENCOUNTER — Ambulatory Visit (INDEPENDENT_AMBULATORY_CARE_PROVIDER_SITE_OTHER): Payer: Medicare Other | Admitting: Adult Health

## 2021-07-09 ENCOUNTER — Other Ambulatory Visit (HOSPITAL_COMMUNITY)
Admission: RE | Admit: 2021-07-09 | Discharge: 2021-07-09 | Disposition: A | Discharge status: Home / Self Care | Payer: Medicare Other | Source: Ambulatory Visit | Attending: Adult Health | Admitting: Adult Health

## 2021-07-09 ENCOUNTER — Encounter: Payer: Self-pay | Admitting: Adult Health

## 2021-07-09 VITALS — BP 156/91 | HR 85 | Ht 66.0 in | Wt 145.2 lb

## 2021-07-09 DIAGNOSIS — Z1211 Encounter for screening for malignant neoplasm of colon: Secondary | ICD-10-CM

## 2021-07-09 DIAGNOSIS — Z01419 Encounter for gynecological examination (general) (routine) without abnormal findings: Secondary | ICD-10-CM

## 2021-07-09 DIAGNOSIS — K649 Unspecified hemorrhoids: Secondary | ICD-10-CM | POA: Insufficient documentation

## 2021-07-09 DIAGNOSIS — Z1151 Encounter for screening for human papillomavirus (HPV): Secondary | ICD-10-CM | POA: Insufficient documentation

## 2021-07-09 DIAGNOSIS — C519 Malignant neoplasm of vulva, unspecified: Secondary | ICD-10-CM | POA: Diagnosis not present

## 2021-07-09 LAB — HEMOCCULT GUIAC POC 1CARD (OFFICE): Fecal Occult Blood, POC: NEGATIVE

## 2021-07-09 NOTE — Progress Notes (Signed)
Patient ID: Briana Wiley, female   DOB: 06/07/1970, 51 y.o.   MRN: [id number] ?History of Present Illness: ?[name] is a 51 year old white female,single. PM, sp vulvectomy for recurrent vulva cancer, in 2021, and is here today for gyn exam and pap. ?She resides in group home.  ?Lab Results  ?Component Value Date  ? DIAGPAP  07/07/2020  ?  - Negative for intraepithelial lesion or malignancy (NILM)  ? Viola Negative 07/07/2020  ?  ?PCP is Dr Legrand Rams. ? ?Current Medications, Allergies, Past Medical History, Past Surgical History, Family History and Social History were reviewed in [employer] record.   ? ? ?Review of Systems: ?Patient denies any headaches, hearing loss, fatigue, blurred vision, shortness of breath, chest pain, abdominal pain, problems with bowel movements, urination, or intercourse(not having sex she says). No joint pain or mood swings.  ?Denies any pain or itching vulva area. ? ? ?Physical Exam:BP (!) 156/91 (BP Location: Right Arm, Patient Position: Sitting, Cuff Size: Normal)   Pulse 85   Ht '5\' 6"'$  (1.676 m)   Wt 145 lb 3.2 oz (65.9 kg)   LMP  (LMP Unknown)   BMI 23.44 kg/m?   ?General:  Well developed, well nourished, no acute distress ?Skin:  Warm and dry ?Neck:  Midline trachea, normal thyroid, good ROM, no lymphadenopathy ?Lungs; Clear to auscultation bilaterally ?Breast:  No dominant palpable mass, retraction, or nipple discharge ?Cardiovascular: Regular rate and rhythm ?Abdomen:  Soft, non tender, no hepatosplenomegaly ?Pelvic:  External genitalia is normal in appearance, sp vulvectomy.  The vagina is pale with loss of rugae. Urethra has no lesions or masses. The cervix is smooth with stenotic os, pap performed with GC/CHL and HPV genotyping.  Uterus is felt to be normal size, shape, and contour.  No adnexal masses or tenderness noted.Bladder is non tender, no masses felt. ?Rectal: Good sphincter tone, no polyps, + hemorrhoids felt, and has external.  Hemoccult  negative. ?Extremities/musculoskeletal:  No swelling or varicosities noted, no clubbing or cyanosis ?Psych:  No mood changes, alert and cooperative,seems happy ?AA os 0 ?Fall risk is low ?Depression screen [facility] 2/9 07/09/2021 07/07/2020 06/03/2019  ?Decreased Interest 0 0 2  ?Down, Depressed, Hopeless 0 0 0  ?PHQ - 2 Score 0 0 2  ?Altered sleeping 0 - 0  ?Tired, decreased energy 3 - 0  ?Change in appetite 0 - 2  ?Feeling bad or failure about yourself  0 - 0  ?Trouble concentrating 0 - 0  ?Moving slowly or fidgety/restless 0 - 0  ?Suicidal thoughts 0 - 0  ?PHQ-9 Score 3 - 4  ?  ?GAD 7 : Generalized Anxiety Score 07/09/2021  ?Nervous, Anxious, on Edge 0  ?Control/stop worrying 0  ?Worry too much - different things 0  ?Trouble relaxing 0  ?Restless 0  ?Easily annoyed or irritable 0  ?Afraid - awful might happen 0  ?Total GAD 7 Score 0  ? ? Upstream - 07/09/21 1104   ? ?  ? Pregnancy Intention Screening  ? Does the patient want to become pregnant in the next year? N/A   ? Does the patient's partner want to become pregnant in the next year? N/A   ? Would the patient like to discuss contraceptive options today? N/A   ?  ? Contraception Wrap Up  ? Current Method Female Sterilization   ? End Method Female Sterilization   ? Contraception Counseling Provided No   ? ?  ?  ? ?  ?  ?  Examination chaperoned by Genia Hotter Rn ? ?Impression and Plan: ?1. Encounter for gynecological examination with Papanicolaou smear of cervix ?Pap sent ?Physical sent ?Labs with PCP ?Mammogram yearly  ? ?2. Encounter for screening fecal occult blood testing ? ?3. Vulva cancer ([place]) ? ?4. Hemorrhoids, unspecified hemorrhoid type ?Use preparation H prn ? ? ? ?  ?  ?

## 2021-07-13 LAB — CYTOLOGY - PAP
Adequacy: ABSENT
Chlamydia: NEGATIVE
Comment: NEGATIVE
Comment: NEGATIVE
Comment: NORMAL
Diagnosis: NEGATIVE
High risk HPV: NEGATIVE
Neisseria Gonorrhea: NEGATIVE

## 2021-07-20 DIAGNOSIS — F209 Schizophrenia, unspecified: Secondary | ICD-10-CM | POA: Diagnosis not present

## 2021-07-20 DIAGNOSIS — F17200 Nicotine dependence, unspecified, uncomplicated: Secondary | ICD-10-CM | POA: Diagnosis not present

## 2021-07-29 DIAGNOSIS — F209 Schizophrenia, unspecified: Secondary | ICD-10-CM | POA: Diagnosis not present

## 2021-08-19 DIAGNOSIS — F209 Schizophrenia, unspecified: Secondary | ICD-10-CM | POA: Diagnosis not present

## 2021-08-20 DIAGNOSIS — J410 Simple chronic bronchitis: Secondary | ICD-10-CM | POA: Diagnosis not present

## 2021-08-20 DIAGNOSIS — F209 Schizophrenia, unspecified: Secondary | ICD-10-CM | POA: Diagnosis not present

## 2021-09-19 DIAGNOSIS — F17200 Nicotine dependence, unspecified, uncomplicated: Secondary | ICD-10-CM | POA: Diagnosis not present

## 2021-09-19 DIAGNOSIS — F209 Schizophrenia, unspecified: Secondary | ICD-10-CM | POA: Diagnosis not present

## 2021-10-14 DIAGNOSIS — R32 Unspecified urinary incontinence: Secondary | ICD-10-CM | POA: Diagnosis not present

## 2021-10-14 DIAGNOSIS — J410 Simple chronic bronchitis: Secondary | ICD-10-CM | POA: Diagnosis not present

## 2021-10-14 DIAGNOSIS — N390 Urinary tract infection, site not specified: Secondary | ICD-10-CM | POA: Diagnosis not present

## 2021-10-14 DIAGNOSIS — F209 Schizophrenia, unspecified: Secondary | ICD-10-CM | POA: Diagnosis not present

## 2021-10-21 DIAGNOSIS — F209 Schizophrenia, unspecified: Secondary | ICD-10-CM | POA: Diagnosis not present

## 2021-11-18 DIAGNOSIS — J410 Simple chronic bronchitis: Secondary | ICD-10-CM | POA: Diagnosis not present

## 2021-11-18 DIAGNOSIS — F209 Schizophrenia, unspecified: Secondary | ICD-10-CM | POA: Diagnosis not present

## 2021-11-24 DIAGNOSIS — N390 Urinary tract infection, site not specified: Secondary | ICD-10-CM | POA: Diagnosis not present

## 2021-12-19 DIAGNOSIS — J410 Simple chronic bronchitis: Secondary | ICD-10-CM | POA: Diagnosis not present

## 2021-12-19 DIAGNOSIS — F209 Schizophrenia, unspecified: Secondary | ICD-10-CM | POA: Diagnosis not present

## 2022-01-19 DIAGNOSIS — J410 Simple chronic bronchitis: Secondary | ICD-10-CM | POA: Diagnosis not present

## 2022-01-19 DIAGNOSIS — F209 Schizophrenia, unspecified: Secondary | ICD-10-CM | POA: Diagnosis not present

## 2022-02-07 DIAGNOSIS — Z23 Encounter for immunization: Secondary | ICD-10-CM | POA: Diagnosis not present

## 2022-02-18 DIAGNOSIS — J410 Simple chronic bronchitis: Secondary | ICD-10-CM | POA: Diagnosis not present

## 2022-02-18 DIAGNOSIS — F209 Schizophrenia, unspecified: Secondary | ICD-10-CM | POA: Diagnosis not present

## 2022-03-04 ENCOUNTER — Other Ambulatory Visit (HOSPITAL_COMMUNITY): Payer: Self-pay | Admitting: Internal Medicine

## 2022-03-04 DIAGNOSIS — Z1231 Encounter for screening mammogram for malignant neoplasm of breast: Secondary | ICD-10-CM

## 2022-03-16 ENCOUNTER — Ambulatory Visit (HOSPITAL_COMMUNITY): Payer: Medicare Other

## 2022-03-21 DIAGNOSIS — F209 Schizophrenia, unspecified: Secondary | ICD-10-CM | POA: Diagnosis not present

## 2022-03-21 DIAGNOSIS — J410 Simple chronic bronchitis: Secondary | ICD-10-CM | POA: Diagnosis not present

## 2022-04-05 DIAGNOSIS — F209 Schizophrenia, unspecified: Secondary | ICD-10-CM | POA: Diagnosis not present

## 2022-04-07 DIAGNOSIS — F17210 Nicotine dependence, cigarettes, uncomplicated: Secondary | ICD-10-CM | POA: Diagnosis not present

## 2022-04-07 DIAGNOSIS — Z1389 Encounter for screening for other disorder: Secondary | ICD-10-CM | POA: Diagnosis not present

## 2022-04-07 DIAGNOSIS — Z859 Personal history of malignant neoplasm, unspecified: Secondary | ICD-10-CM | POA: Diagnosis not present

## 2022-04-07 DIAGNOSIS — F209 Schizophrenia, unspecified: Secondary | ICD-10-CM | POA: Diagnosis not present

## 2022-04-07 DIAGNOSIS — Z1331 Encounter for screening for depression: Secondary | ICD-10-CM | POA: Diagnosis not present

## 2022-04-07 DIAGNOSIS — J410 Simple chronic bronchitis: Secondary | ICD-10-CM | POA: Diagnosis not present

## 2022-04-07 DIAGNOSIS — F17200 Nicotine dependence, unspecified, uncomplicated: Secondary | ICD-10-CM | POA: Diagnosis not present

## 2022-05-05 ENCOUNTER — Other Ambulatory Visit (HOSPITAL_COMMUNITY): Payer: Self-pay | Admitting: Gerontology

## 2022-05-05 DIAGNOSIS — Z87891 Personal history of nicotine dependence: Secondary | ICD-10-CM

## 2022-05-08 DIAGNOSIS — F209 Schizophrenia, unspecified: Secondary | ICD-10-CM | POA: Diagnosis not present

## 2022-05-08 DIAGNOSIS — J410 Simple chronic bronchitis: Secondary | ICD-10-CM | POA: Diagnosis not present

## 2022-05-20 DIAGNOSIS — Z859 Personal history of malignant neoplasm, unspecified: Secondary | ICD-10-CM | POA: Diagnosis not present

## 2022-05-20 DIAGNOSIS — Z0001 Encounter for general adult medical examination with abnormal findings: Secondary | ICD-10-CM | POA: Diagnosis not present

## 2022-05-20 DIAGNOSIS — E559 Vitamin D deficiency, unspecified: Secondary | ICD-10-CM | POA: Diagnosis not present

## 2022-05-20 DIAGNOSIS — E039 Hypothyroidism, unspecified: Secondary | ICD-10-CM | POA: Diagnosis not present

## 2022-05-20 DIAGNOSIS — Z79899 Other long term (current) drug therapy: Secondary | ICD-10-CM | POA: Diagnosis not present

## 2022-05-20 DIAGNOSIS — R799 Abnormal finding of blood chemistry, unspecified: Secondary | ICD-10-CM | POA: Diagnosis not present

## 2022-05-25 ENCOUNTER — Encounter (HOSPITAL_COMMUNITY): Payer: Self-pay

## 2022-05-25 ENCOUNTER — Ambulatory Visit (HOSPITAL_COMMUNITY): Admission: RE | Admit: 2022-05-25 | Payer: Medicare Other | Source: Ambulatory Visit

## 2022-06-08 DIAGNOSIS — J410 Simple chronic bronchitis: Secondary | ICD-10-CM | POA: Diagnosis not present

## 2022-06-08 DIAGNOSIS — F209 Schizophrenia, unspecified: Secondary | ICD-10-CM | POA: Diagnosis not present

## 2022-07-07 DIAGNOSIS — J410 Simple chronic bronchitis: Secondary | ICD-10-CM | POA: Diagnosis not present

## 2022-07-07 DIAGNOSIS — F209 Schizophrenia, unspecified: Secondary | ICD-10-CM | POA: Diagnosis not present

## 2022-08-07 DIAGNOSIS — F209 Schizophrenia, unspecified: Secondary | ICD-10-CM | POA: Diagnosis not present

## 2022-08-07 DIAGNOSIS — J410 Simple chronic bronchitis: Secondary | ICD-10-CM | POA: Diagnosis not present

## 2022-09-06 DIAGNOSIS — F209 Schizophrenia, unspecified: Secondary | ICD-10-CM | POA: Diagnosis not present

## 2022-09-06 DIAGNOSIS — J410 Simple chronic bronchitis: Secondary | ICD-10-CM | POA: Diagnosis not present

## 2022-09-12 DIAGNOSIS — F209 Schizophrenia, unspecified: Secondary | ICD-10-CM | POA: Diagnosis not present

## 2022-10-11 DIAGNOSIS — R32 Unspecified urinary incontinence: Secondary | ICD-10-CM | POA: Diagnosis not present

## 2022-10-11 DIAGNOSIS — F209 Schizophrenia, unspecified: Secondary | ICD-10-CM | POA: Diagnosis not present

## 2022-10-11 DIAGNOSIS — F17200 Nicotine dependence, unspecified, uncomplicated: Secondary | ICD-10-CM | POA: Diagnosis not present

## 2022-10-11 DIAGNOSIS — F17210 Nicotine dependence, cigarettes, uncomplicated: Secondary | ICD-10-CM | POA: Diagnosis not present

## 2022-10-11 DIAGNOSIS — J410 Simple chronic bronchitis: Secondary | ICD-10-CM | POA: Diagnosis not present

## 2022-11-10 DIAGNOSIS — F209 Schizophrenia, unspecified: Secondary | ICD-10-CM | POA: Diagnosis not present

## 2022-11-10 DIAGNOSIS — J410 Simple chronic bronchitis: Secondary | ICD-10-CM | POA: Diagnosis not present

## 2022-12-12 DIAGNOSIS — J410 Simple chronic bronchitis: Secondary | ICD-10-CM | POA: Diagnosis not present

## 2022-12-12 DIAGNOSIS — F209 Schizophrenia, unspecified: Secondary | ICD-10-CM | POA: Diagnosis not present

## 2023-01-12 DIAGNOSIS — F209 Schizophrenia, unspecified: Secondary | ICD-10-CM | POA: Diagnosis not present

## 2023-01-12 DIAGNOSIS — J410 Simple chronic bronchitis: Secondary | ICD-10-CM | POA: Diagnosis not present

## 2023-01-27 ENCOUNTER — Ambulatory Visit (HOSPITAL_COMMUNITY)
Admission: RE | Admit: 2023-01-27 | Discharge: 2023-01-27 | Disposition: A | Discharge status: Home / Self Care | Payer: Medicare Other | Source: Ambulatory Visit | Attending: Internal Medicine | Admitting: Internal Medicine

## 2023-01-27 DIAGNOSIS — Z1231 Encounter for screening mammogram for malignant neoplasm of breast: Secondary | ICD-10-CM | POA: Diagnosis not present

## 2023-01-30 ENCOUNTER — Other Ambulatory Visit: Payer: Medicare Other | Admitting: Adult Health

## 2023-02-01 ENCOUNTER — Ambulatory Visit: Payer: Medicare Other | Admitting: Adult Health

## 2023-02-01 ENCOUNTER — Encounter: Payer: Self-pay | Admitting: Adult Health

## 2023-02-01 VITALS — BP 125/79 | HR 69 | Ht 67.0 in | Wt 136.0 lb

## 2023-02-01 DIAGNOSIS — Z1331 Encounter for screening for depression: Secondary | ICD-10-CM | POA: Diagnosis not present

## 2023-02-01 DIAGNOSIS — F17200 Nicotine dependence, unspecified, uncomplicated: Secondary | ICD-10-CM | POA: Diagnosis not present

## 2023-02-01 DIAGNOSIS — C519 Malignant neoplasm of vulva, unspecified: Secondary | ICD-10-CM | POA: Diagnosis not present

## 2023-02-01 DIAGNOSIS — K649 Unspecified hemorrhoids: Secondary | ICD-10-CM

## 2023-02-01 DIAGNOSIS — R350 Frequency of micturition: Secondary | ICD-10-CM | POA: Diagnosis not present

## 2023-02-01 DIAGNOSIS — N3946 Mixed incontinence: Secondary | ICD-10-CM

## 2023-02-01 DIAGNOSIS — Z0000 Encounter for general adult medical examination without abnormal findings: Secondary | ICD-10-CM

## 2023-02-01 DIAGNOSIS — Z1211 Encounter for screening for malignant neoplasm of colon: Secondary | ICD-10-CM

## 2023-02-01 DIAGNOSIS — Z01419 Encounter for gynecological examination (general) (routine) without abnormal findings: Secondary | ICD-10-CM | POA: Diagnosis not present

## 2023-02-01 LAB — HEMOCCULT GUIAC POC 1CARD (OFFICE): Fecal Occult Blood, POC: NEGATIVE

## 2023-02-01 MED ORDER — OXYBUTYNIN CHLORIDE ER 10 MG PO TB24: 10.0000 mg | ORAL_TABLET | Freq: Every day | ORAL | 6 refills | Status: AC

## 2023-02-01 NOTE — Progress Notes (Signed)
Patient ID: Briana Wiley, female   DOB: Dec 18, 1970, 52 y.o.   MRN: [id number] History of Present Illness: Briana Wiley is a 52 year old white female, widowed, PM, in for a well woman gyn exam. She is complaining of urinary frequency and incontinence. History of vulva cancer, with vulvectomy and radiation. She resides at [facility].      Component Value Date/Time   DIAGPAP  07/09/2021 1132    - Negative for intraepithelial lesion or malignancy (NILM)   DIAGPAP  07/07/2020 1432    - Negative for intraepithelial lesion or malignancy (NILM)   DIAGPAP  06/03/2019 1027    - Negative for Intraepithelial Lesions or Malignancy (NILM)   DIAGPAP - Benign reactive/reparative changes 06/03/2019 1027   HPVHIGH Negative 07/09/2021 1132   HPVHIGH Negative 07/07/2020 1432   HPVHIGH Positive (A) 06/03/2019 1027   ADEQPAP  07/09/2021 1132    Satisfactory for evaluation; transformation zone component ABSENT.   ADEQPAP  07/07/2020 1432    Satisfactory for evaluation; transformation zone component ABSENT.   ADEQPAP  06/03/2019 1027    Satisfactory but limited for evaluation with partially obscuring   ADEQPAP  06/03/2019 1027    inflammation; transformation zone component present.    PCP is Dr Felecia Shelling    Current Medications, Allergies, Past Medical History, Past Surgical History, Family History and Social History were reviewed in [employer] electronic medical record.     Review of Systems: Patient denies any headaches, hearing loss, fatigue, blurred vision, shortness of breath, chest pain, abdominal pain, problems with bowel movements, or intercourse(not active). No joint pain or mood swings.  She denies any bleeding See HPI for positives.   Physical Exam:BP 125/79 (BP Location: Left Arm, Patient Position: Sitting, Cuff Size: Normal)   Pulse 69   Ht 5\' 7"  (1.702 m)   Wt 136 lb (61.7 kg)   BMI 21.30 kg/m   General:  Well developed, well nourished, no acute distress Skin:  Warm and  dry Neck:  Midline trachea, normal thyroid, good ROM, no lymphadenopathy Lungs: had expiratory wheezing in both lower lobes Breast:  No dominant palpable mass, retraction, or nipple discharge Cardiovascular: Regular rate and rhythm Abdomen:  Soft, non tender, no hepatosplenomegaly Pelvic:  External genitalia is sp vulvectomy and has radiation changes. The vagina is pale. Urethra has no lesions or masses. The cervix is smooth.  Uterus is felt to be normal size, shape, and contour.  No adnexal masses or tenderness noted.Bladder is non tender, no masses felt. Rectal: Good sphincter tone, no polyps, + hemorrhoids felt.  Hemoccult negative. Extremities/musculoskeletal:  No swelling or varicosities noted, no clubbing or cyanosis Psych:  No mood changes, alert and cooperative,seems happy AA is 0 Fall risk is low    02/01/2023   10:30 AM 07/09/2021   11:04 AM 07/07/2020    2:34 PM  Depression screen PHQ 2/9  Decreased Interest 0 0 0  Down, Depressed, Hopeless 0 0 0  PHQ - 2 Score 0 0 0  Altered sleeping 0 0   Tired, decreased energy 0 3   Change in appetite 0 0   Feeling bad or failure about yourself  0 0   Trouble concentrating 0 0   Moving slowly or fidgety/restless 0 0   Suicidal thoughts 0 0   PHQ-9 Score 0 3        02/01/2023   10:30 AM 07/09/2021   11:05 AM  GAD 7 : Generalized Anxiety Score  Nervous, Anxious, on Edge 0  0  Control/stop worrying 0 0  Worry too much - different things 0 0  Trouble relaxing 0 0  Restless 0 0  Easily annoyed or irritable 0 0  Afraid - awful might happen 0 0  Total GAD 7 Score 0 0      Upstream - 02/01/23 1035       Pregnancy Intention Screening   Does the patient want to become pregnant in the next year? No    Does the patient's partner want to become pregnant in the next year? No    Would the patient like to discuss contraceptive options today? No      Contraception Wrap Up   Current Method Female Sterilization    End Method Female  Sterilization    Contraception Counseling Provided No             Examination chaperoned by [place] Scearce NP student   Impression and plan: 1. Encounter for well woman exam with routine gynecological exam Physical in 1 year Pap in 2026 Mammogram was negative 01/27/23  2. Encounter for screening fecal occult blood testing Hemoccult was negtive  - POCT occult blood stool  3. Vulva cancer ([facility])  4. Hemorrhoids, unspecified hemorrhoid type  5. Urinary frequency Has more urinary frequency Will stop current ditropan and rx ditropan XL 10 mg 1 daily   Meds ordered this encounter  Medications   oxybutynin (DITROPAN XL) 10 MG 24 hr tablet    Sig: Take 1 tablet (10 mg total) by mouth daily.    Dispense:  30 tablet    Refill:  6    Order Specific Question:   Supervising Provider    Answer:   Duane Lope H [[id number]]   Follow up in 2 months for ROS   6. Mixed stress and urge urinary incontinence +incontinence   7. Smoker She declines to quit smoking

## 2023-02-11 DIAGNOSIS — J410 Simple chronic bronchitis: Secondary | ICD-10-CM | POA: Diagnosis not present

## 2023-02-11 DIAGNOSIS — F209 Schizophrenia, unspecified: Secondary | ICD-10-CM | POA: Diagnosis not present

## 2023-02-23 DIAGNOSIS — F209 Schizophrenia, unspecified: Secondary | ICD-10-CM | POA: Diagnosis not present

## 2023-02-27 DIAGNOSIS — F209 Schizophrenia, unspecified: Secondary | ICD-10-CM | POA: Diagnosis not present

## 2023-03-14 DIAGNOSIS — F209 Schizophrenia, unspecified: Secondary | ICD-10-CM | POA: Diagnosis not present

## 2023-03-14 DIAGNOSIS — J410 Simple chronic bronchitis: Secondary | ICD-10-CM | POA: Diagnosis not present

## 2023-03-16 DIAGNOSIS — F209 Schizophrenia, unspecified: Secondary | ICD-10-CM | POA: Diagnosis not present

## 2023-03-28 DIAGNOSIS — Z23 Encounter for immunization: Secondary | ICD-10-CM | POA: Diagnosis not present

## 2023-04-03 ENCOUNTER — Ambulatory Visit: Payer: Medicare Other | Admitting: Adult Health

## 2023-04-06 DIAGNOSIS — F209 Schizophrenia, unspecified: Secondary | ICD-10-CM | POA: Diagnosis not present

## 2023-04-11 DIAGNOSIS — Z1331 Encounter for screening for depression: Secondary | ICD-10-CM | POA: Diagnosis not present

## 2023-04-11 DIAGNOSIS — J410 Simple chronic bronchitis: Secondary | ICD-10-CM | POA: Diagnosis not present

## 2023-04-11 DIAGNOSIS — F209 Schizophrenia, unspecified: Secondary | ICD-10-CM | POA: Diagnosis not present

## 2023-04-11 DIAGNOSIS — F17210 Nicotine dependence, cigarettes, uncomplicated: Secondary | ICD-10-CM | POA: Diagnosis not present

## 2023-04-11 DIAGNOSIS — R946 Abnormal results of thyroid function studies: Secondary | ICD-10-CM | POA: Diagnosis not present

## 2023-04-11 DIAGNOSIS — Z1389 Encounter for screening for other disorder: Secondary | ICD-10-CM | POA: Diagnosis not present

## 2023-04-11 DIAGNOSIS — R32 Unspecified urinary incontinence: Secondary | ICD-10-CM | POA: Diagnosis not present

## 2023-04-11 DIAGNOSIS — F17200 Nicotine dependence, unspecified, uncomplicated: Secondary | ICD-10-CM | POA: Diagnosis not present

## 2023-04-27 DIAGNOSIS — F209 Schizophrenia, unspecified: Secondary | ICD-10-CM | POA: Diagnosis not present

## 2023-05-12 DIAGNOSIS — F209 Schizophrenia, unspecified: Secondary | ICD-10-CM | POA: Diagnosis not present

## 2023-05-12 DIAGNOSIS — J410 Simple chronic bronchitis: Secondary | ICD-10-CM | POA: Diagnosis not present

## 2023-05-18 DIAGNOSIS — F209 Schizophrenia, unspecified: Secondary | ICD-10-CM | POA: Diagnosis not present

## 2023-06-08 DIAGNOSIS — F209 Schizophrenia, unspecified: Secondary | ICD-10-CM | POA: Diagnosis not present

## 2023-06-12 DIAGNOSIS — F209 Schizophrenia, unspecified: Secondary | ICD-10-CM | POA: Diagnosis not present

## 2023-06-12 DIAGNOSIS — J410 Simple chronic bronchitis: Secondary | ICD-10-CM | POA: Diagnosis not present

## 2023-06-29 DIAGNOSIS — F209 Schizophrenia, unspecified: Secondary | ICD-10-CM | POA: Diagnosis not present

## 2023-07-03 DIAGNOSIS — Z859 Personal history of malignant neoplasm, unspecified: Secondary | ICD-10-CM | POA: Diagnosis not present

## 2023-07-03 DIAGNOSIS — E039 Hypothyroidism, unspecified: Secondary | ICD-10-CM | POA: Diagnosis not present

## 2023-07-03 DIAGNOSIS — Z808 Family history of malignant neoplasm of other organs or systems: Secondary | ICD-10-CM | POA: Diagnosis not present

## 2023-07-03 DIAGNOSIS — Z0001 Encounter for general adult medical examination with abnormal findings: Secondary | ICD-10-CM | POA: Diagnosis not present

## 2023-07-10 DIAGNOSIS — F209 Schizophrenia, unspecified: Secondary | ICD-10-CM | POA: Diagnosis not present

## 2023-07-10 DIAGNOSIS — J410 Simple chronic bronchitis: Secondary | ICD-10-CM | POA: Diagnosis not present

## 2023-07-17 DIAGNOSIS — F209 Schizophrenia, unspecified: Secondary | ICD-10-CM | POA: Diagnosis not present

## 2023-07-20 DIAGNOSIS — F209 Schizophrenia, unspecified: Secondary | ICD-10-CM | POA: Diagnosis not present

## 2023-08-10 DIAGNOSIS — J410 Simple chronic bronchitis: Secondary | ICD-10-CM | POA: Diagnosis not present

## 2023-08-10 DIAGNOSIS — F209 Schizophrenia, unspecified: Secondary | ICD-10-CM | POA: Diagnosis not present

## 2023-08-31 DIAGNOSIS — F209 Schizophrenia, unspecified: Secondary | ICD-10-CM | POA: Diagnosis not present

## 2023-09-09 DIAGNOSIS — J410 Simple chronic bronchitis: Secondary | ICD-10-CM | POA: Diagnosis not present

## 2023-09-09 DIAGNOSIS — F209 Schizophrenia, unspecified: Secondary | ICD-10-CM | POA: Diagnosis not present

## 2023-09-21 DIAGNOSIS — F209 Schizophrenia, unspecified: Secondary | ICD-10-CM | POA: Diagnosis not present

## 2023-10-10 ENCOUNTER — Other Ambulatory Visit (HOSPITAL_COMMUNITY): Payer: Self-pay | Admitting: Internal Medicine

## 2023-10-10 DIAGNOSIS — F17200 Nicotine dependence, unspecified, uncomplicated: Secondary | ICD-10-CM | POA: Diagnosis not present

## 2023-10-10 DIAGNOSIS — J410 Simple chronic bronchitis: Secondary | ICD-10-CM | POA: Diagnosis not present

## 2023-10-10 DIAGNOSIS — R32 Unspecified urinary incontinence: Secondary | ICD-10-CM | POA: Diagnosis not present

## 2023-10-10 DIAGNOSIS — Z1231 Encounter for screening mammogram for malignant neoplasm of breast: Secondary | ICD-10-CM

## 2023-10-10 DIAGNOSIS — F209 Schizophrenia, unspecified: Secondary | ICD-10-CM | POA: Diagnosis not present

## 2023-10-10 DIAGNOSIS — F17210 Nicotine dependence, cigarettes, uncomplicated: Secondary | ICD-10-CM | POA: Diagnosis not present

## 2023-10-12 DIAGNOSIS — F209 Schizophrenia, unspecified: Secondary | ICD-10-CM | POA: Diagnosis not present

## 2023-11-09 DIAGNOSIS — F209 Schizophrenia, unspecified: Secondary | ICD-10-CM | POA: Diagnosis not present

## 2023-11-09 DIAGNOSIS — J410 Simple chronic bronchitis: Secondary | ICD-10-CM | POA: Diagnosis not present

## 2023-11-23 DIAGNOSIS — F209 Schizophrenia, unspecified: Secondary | ICD-10-CM | POA: Diagnosis not present

## 2023-12-10 DIAGNOSIS — J410 Simple chronic bronchitis: Secondary | ICD-10-CM | POA: Diagnosis not present

## 2023-12-10 DIAGNOSIS — F209 Schizophrenia, unspecified: Secondary | ICD-10-CM | POA: Diagnosis not present

## 2023-12-14 DIAGNOSIS — F209 Schizophrenia, unspecified: Secondary | ICD-10-CM | POA: Diagnosis not present

## 2023-12-26 DIAGNOSIS — F209 Schizophrenia, unspecified: Secondary | ICD-10-CM | POA: Diagnosis not present

## 2024-01-10 DIAGNOSIS — J410 Simple chronic bronchitis: Secondary | ICD-10-CM | POA: Diagnosis not present

## 2024-01-10 DIAGNOSIS — F209 Schizophrenia, unspecified: Secondary | ICD-10-CM | POA: Diagnosis not present

## 2024-01-11 DIAGNOSIS — F209 Schizophrenia, unspecified: Secondary | ICD-10-CM | POA: Diagnosis not present

## 2024-01-15 DIAGNOSIS — Z23 Encounter for immunization: Secondary | ICD-10-CM | POA: Diagnosis not present

## 2024-01-29 ENCOUNTER — Ambulatory Visit (HOSPITAL_COMMUNITY)
Admission: RE | Admit: 2024-01-29 | Discharge: 2024-01-29 | Disposition: A | Discharge status: Home / Self Care | Source: Ambulatory Visit | Attending: Internal Medicine | Admitting: Internal Medicine

## 2024-01-29 ENCOUNTER — Encounter (HOSPITAL_COMMUNITY): Payer: Self-pay

## 2024-01-29 DIAGNOSIS — Z1231 Encounter for screening mammogram for malignant neoplasm of breast: Secondary | ICD-10-CM | POA: Diagnosis not present

## 2024-02-01 DIAGNOSIS — F209 Schizophrenia, unspecified: Secondary | ICD-10-CM | POA: Diagnosis not present

## 2024-02-09 DIAGNOSIS — J410 Simple chronic bronchitis: Secondary | ICD-10-CM | POA: Diagnosis not present

## 2024-02-09 DIAGNOSIS — F209 Schizophrenia, unspecified: Secondary | ICD-10-CM | POA: Diagnosis not present

## 2024-02-22 DIAGNOSIS — F209 Schizophrenia, unspecified: Secondary | ICD-10-CM | POA: Diagnosis not present

## 2024-03-14 DIAGNOSIS — F209 Schizophrenia, unspecified: Secondary | ICD-10-CM | POA: Diagnosis not present

## 2024-05-20 ENCOUNTER — Other Ambulatory Visit (HOSPITAL_COMMUNITY): Payer: Self-pay | Admitting: Internal Medicine

## 2024-05-20 DIAGNOSIS — Z1231 Encounter for screening mammogram for malignant neoplasm of breast: Secondary | ICD-10-CM

## 2025-01-29 ENCOUNTER — Ambulatory Visit (HOSPITAL_COMMUNITY)
